# Patient Record
Sex: Female | Born: 1950 | Hispanic: Yes | Marital: Married | Smoking: Never smoker
Health system: Southern US, Community
[De-identification: ages and names within clinical notes are randomized; demographics above are authoritative.]

## PROBLEM LIST (undated history)

## (undated) DIAGNOSIS — J45909 Unspecified asthma, uncomplicated: Secondary | ICD-10-CM

## (undated) DIAGNOSIS — I10 Essential (primary) hypertension: Secondary | ICD-10-CM

## (undated) DIAGNOSIS — D649 Anemia, unspecified: Secondary | ICD-10-CM

## (undated) DIAGNOSIS — B019 Varicella without complication: Secondary | ICD-10-CM

## (undated) DIAGNOSIS — J42 Unspecified chronic bronchitis: Secondary | ICD-10-CM

## (undated) DIAGNOSIS — M1990 Unspecified osteoarthritis, unspecified site: Secondary | ICD-10-CM

## (undated) DIAGNOSIS — G510 Bell's palsy: Secondary | ICD-10-CM

## (undated) DIAGNOSIS — I499 Cardiac arrhythmia, unspecified: Secondary | ICD-10-CM

## (undated) DIAGNOSIS — K219 Gastro-esophageal reflux disease without esophagitis: Secondary | ICD-10-CM

## (undated) DIAGNOSIS — E119 Type 2 diabetes mellitus without complications: Secondary | ICD-10-CM

## (undated) DIAGNOSIS — J449 Chronic obstructive pulmonary disease, unspecified: Secondary | ICD-10-CM

## (undated) DIAGNOSIS — F329 Major depressive disorder, single episode, unspecified: Secondary | ICD-10-CM

## (undated) DIAGNOSIS — F32A Depression, unspecified: Secondary | ICD-10-CM

## (undated) DIAGNOSIS — Z9889 Other specified postprocedural states: Secondary | ICD-10-CM

## (undated) DIAGNOSIS — I517 Cardiomegaly: Secondary | ICD-10-CM

## (undated) DIAGNOSIS — G629 Polyneuropathy, unspecified: Secondary | ICD-10-CM

## (undated) DIAGNOSIS — K760 Fatty (change of) liver, not elsewhere classified: Secondary | ICD-10-CM

## (undated) DIAGNOSIS — T7840XA Allergy, unspecified, initial encounter: Secondary | ICD-10-CM

## (undated) DIAGNOSIS — M797 Fibromyalgia: Secondary | ICD-10-CM

## (undated) DIAGNOSIS — I456 Pre-excitation syndrome: Secondary | ICD-10-CM

## (undated) DIAGNOSIS — Z9289 Personal history of other medical treatment: Secondary | ICD-10-CM

## (undated) DIAGNOSIS — M5430 Sciatica, unspecified side: Secondary | ICD-10-CM

## (undated) DIAGNOSIS — E785 Hyperlipidemia, unspecified: Secondary | ICD-10-CM

## (undated) DIAGNOSIS — R112 Nausea with vomiting, unspecified: Secondary | ICD-10-CM

## (undated) HISTORY — DX: Allergy, unspecified, initial encounter: T78.40XA

## (undated) HISTORY — DX: Pre-excitation syndrome: I45.6

## (undated) HISTORY — DX: Fibromyalgia: M79.7

## (undated) HISTORY — DX: Personal history of other medical treatment: Z92.89

## (undated) HISTORY — DX: Type 2 diabetes mellitus without complications: E11.9

## (undated) HISTORY — PX: UPPER GI ENDOSCOPY: SHX6162

## (undated) HISTORY — PX: CHOLECYSTECTOMY: SHX55

## (undated) HISTORY — DX: Varicella without complication: B01.9

## (undated) HISTORY — PX: CERVICAL CONE BIOPSY: SUR198

## (undated) HISTORY — DX: Unspecified osteoarthritis, unspecified site: M19.90

## (undated) HISTORY — DX: Chronic obstructive pulmonary disease, unspecified: J44.9

## (undated) HISTORY — DX: Major depressive disorder, single episode, unspecified: F32.9

## (undated) HISTORY — DX: Hyperlipidemia, unspecified: E78.5

## (undated) HISTORY — DX: Bell's palsy: G51.0

## (undated) HISTORY — PX: JOINT REPLACEMENT: SHX530

## (undated) HISTORY — DX: Depression, unspecified: F32.A

## (undated) HISTORY — DX: Unspecified asthma, uncomplicated: J45.909

## (undated) HISTORY — PX: COLONOSCOPY: SHX174

## (undated) HISTORY — DX: Gastro-esophageal reflux disease without esophagitis: K21.9

## (undated) HISTORY — DX: Sciatica, unspecified side: M54.30

## (undated) HISTORY — DX: Essential (primary) hypertension: I10

---

## 2003-02-19 HISTORY — PX: REPLACEMENT TOTAL KNEE: SUR1224

## 2007-02-19 HISTORY — PX: CARDIAC ELECTROPHYSIOLOGY MAPPING AND ABLATION: SHX1292

## 2016-03-01 ENCOUNTER — Other Ambulatory Visit (INDEPENDENT_AMBULATORY_CARE_PROVIDER_SITE_OTHER): Payer: Medicare HMO

## 2016-03-01 ENCOUNTER — Ambulatory Visit (INDEPENDENT_AMBULATORY_CARE_PROVIDER_SITE_OTHER): Payer: Medicare HMO | Admitting: Family

## 2016-03-01 ENCOUNTER — Encounter: Payer: Self-pay | Admitting: Family

## 2016-03-01 VITALS — BP 138/72 | HR 71 | Temp 98.0°F | Resp 16 | Ht 60.0 in | Wt 174.0 lb

## 2016-03-01 DIAGNOSIS — E119 Type 2 diabetes mellitus without complications: Secondary | ICD-10-CM | POA: Diagnosis not present

## 2016-03-01 DIAGNOSIS — E782 Mixed hyperlipidemia: Secondary | ICD-10-CM | POA: Insufficient documentation

## 2016-03-01 DIAGNOSIS — F330 Major depressive disorder, recurrent, mild: Secondary | ICD-10-CM | POA: Diagnosis not present

## 2016-03-01 DIAGNOSIS — R079 Chest pain, unspecified: Secondary | ICD-10-CM

## 2016-03-01 DIAGNOSIS — I10 Essential (primary) hypertension: Secondary | ICD-10-CM | POA: Insufficient documentation

## 2016-03-01 LAB — COMPREHENSIVE METABOLIC PANEL
ALT: 17 U/L (ref 0–35)
AST: 21 U/L (ref 0–37)
Albumin: 4.3 g/dL (ref 3.5–5.2)
Alkaline Phosphatase: 75 U/L (ref 39–117)
BILIRUBIN TOTAL: 0.3 mg/dL (ref 0.2–1.2)
BUN: 17 mg/dL (ref 6–23)
CALCIUM: 9.7 mg/dL (ref 8.4–10.5)
CHLORIDE: 103 meq/L (ref 96–112)
CO2: 32 meq/L (ref 19–32)
Creatinine, Ser: 0.65 mg/dL (ref 0.40–1.20)
GFR: 96.67 mL/min (ref >60.00)
GLUCOSE: 121 mg/dL — AB (ref 70–99)
Potassium: 4 mEq/L (ref 3.5–5.1)
Sodium: 140 mEq/L (ref 135–145)
Total Protein: 7.3 g/dL (ref 6.0–8.3)

## 2016-03-01 LAB — MICROALBUMIN / CREATININE URINE RATIO
CREATININE, U: 171.8 mg/dL
Microalb Creat Ratio: 20.1 mg/g (ref 0.0–30.0)
Microalb, Ur: 34.6 mg/dL — ABNORMAL HIGH (ref 0.0–1.9)

## 2016-03-01 LAB — HEMOGLOBIN A1C: HEMOGLOBIN A1C: 7.3 % — AB (ref 4.6–6.5)

## 2016-03-01 MED ORDER — GLIPIZIDE ER 2.5 MG PO TB24: 2.5000 mg | ORAL_TABLET | Freq: Every day | ORAL | 0 refills | Status: DC

## 2016-03-01 NOTE — Assessment & Plan Note (Signed)
Sharp chest pain described by patient with an office EKG showing normal sinus rhythm. No evidence of Wolf Parkinson's White syndrome. Does not appear to be cardiac related. Continue to monitor.

## 2016-03-01 NOTE — Assessment & Plan Note (Signed)
Pressures below goal 140/90 with current regimen and no adverse side effects or hypotensive readings. Denies worst headache of life with no new symptoms of end organ damage noted upon physical exam. Encouraged to continue monitor blood pressure at home and follow low-sodium diet. Continue current dosage of lisinopril-hydrochlorothiazide.

## 2016-03-01 NOTE — Assessment & Plan Note (Signed)
Depression appears stable with current regimen and no adverse side effects of medication. Denies suicidal ideation. Continue current dosage of Lexapro.

## 2016-03-01 NOTE — Assessment & Plan Note (Signed)
Dyslipidemia appears stable with current regimen and no adverse side effects or myalgias. Recheck lipid profile during annual exam. Continue current dosage of lovastatin pending lipid profile results.

## 2016-03-01 NOTE — Patient Instructions (Addendum)
Thank you for choosing [facility].  SUMMARY AND INSTRUCTIONS:  Medication:  Please STOP taking the glipizide and start taking Glipizide ER.   Continue to take your medications as prescribed and monitor your blood sugars and blood pressure at home.   Your prescription(s) have been submitted to your pharmacy or been printed and provided for you. Please take as directed and contact our office if you believe you are having problem(s) with the medication(s) or have any questions.  Labs:  Please stop by the lab on the lower level of the building for your blood work. Your results will be released to [place] (or called to you) after review, usually within 72 hours after test completion. If any changes need to be made, you will be notified at that same time.  1.) The lab is open from 7:30am to 5:30 pm Monday-Friday 2.) No appointment is necessary 3.) Fasting (if needed) is 6-8 hours after food and drink; black coffee and water are okay   Follow up:  If your symptoms worsen or fail to improve, please contact our office for further instruction, or in case of emergency go directly to the emergency room at the closest medical facility.    DIABETES CARE INSTRUCTIONS:  Current A1c: No results found for: HGBA1C  A1C Goal: <7.0%  Fasting Blood Sugar Goal: 80-130 Blood sugar check that you take after fasting for at least 8 hours which is usually in the morning before eating.  Post-prandial Blood Sugar Goal: <180 Blood sugar check approximately 1-2 hours after the start of a meal.   Diabetes Prevention Screens:  1.) Ensure you have an annual eye exam by an ophthalmologist or optometrist.   2,) Ensure you have an annual foot exam or when any changes are noted including new onset numbness/tingling or wounds. Check your feet daily!  3.) The American Diabetes Association recommends the Pneumovax vaccination against pneumonia every 5 years.  4.) We will check your kidney function with a  urine test on an annual basis.

## 2016-03-01 NOTE — Progress Notes (Signed)
Subjective:    Patient ID: Deanna Watson, female    DOB: 1951-02-17, 66 y.o.   MRN: [id number]  Chief Complaint  Patient presents with  . Establish Care    diabetes,hypertension, depression    HPI:  Deanna Watson is a 66 y.o. female who  has a past medical history of Arthritis; Asthma; Chicken pox; COPD (chronic obstructive pulmonary disease) ([place]); Depression; Diabetes mellitus without complication ([place]); Fibromyalgia; Heart murmur; History of blood transfusion; Hyperlipidemia; and Hypertension. and presents today for an office visit to establish care.   1.)  Hypertension - Currently maintained on lisinopril-hydrochlorothiazide. Reports taking the medication as prescribed and denies adverse side effects. Blood pressures at home  . Denies worse headache of life. Does have some changes in vision. Working on following a low-sodium diet.  BP Readings from Last 3 Encounters:  03/01/16 138/72    2.) Type 2 diabetes - currently maintained on metformin and glipizide. Reports taking the medication as prescribed and denies adverse side effects.  Has had some hypoglycemia with glipizide on occasion and has made adjustment to her medications which seems to help. Home blood sugars appear to be adequately controlled around goal of 130. Does have diabetic foot pain. Due for eye exam and Pneumovax.    3.) Hyperlipidemia - Currently maintained on lovastatin. Reports taking the medication as prescribed and denies adverse side effects or myalgias. Does express some mild chest pain on occasion with no shortness of breath or heart palpitations.   No results found for: CHOL, HDL, LDLCALC, LDLDIRECT, TRIG, CHOLHDL   4.) Depression - currently maintained on Lexapro. Reports taking medication as prescribed and denies adverse side effects. Notes mood is generally well controlled with medication regimen with no adverse side effects or thoughts of suicide. Does continue to have a significant amount of stress  associated with her chronic medical conditions and as his medical conditions.   No Known Allergies    No outpatient prescriptions prior to visit.   No facility-administered medications prior to visit.      Past Medical History:  Diagnosis Date  . Arthritis   . Asthma   . Chicken pox   . COPD (chronic obstructive pulmonary disease) ([place])   . Depression   . Diabetes mellitus without complication ([place])   . Fibromyalgia   . Heart murmur   . History of blood transfusion   . Hyperlipidemia   . Hypertension       Past Surgical History:  Procedure Laterality Date  . CARDIAC ELECTROPHYSIOLOGY [place] AND ABLATION  2009  . REPLACEMENT TOTAL KNEE Left 2005      Family History  Problem Relation Age of Onset  . Arthritis Mother   . Heart disease Mother   . Stroke Mother   . Hypertension Mother   . Kidney disease Mother   . Diabetes Mother   . Alcohol abuse Father   . Epilepsy Father   . Heart disease Brother       Social History   Social History  . Marital status: Married    Spouse name: N/A  . Number of children: 1  . Years of education: 77   Occupational History  . Retired    Social History Main Topics  . Smoking status: Never Smoker  . Smokeless tobacco: Never Used  . Alcohol use No  . Drug use: No  . Sexual activity: Not on file   Other Topics Concern  . Not on file   Social History Narrative   Denies  abuse and feels safe at home.       Review of Systems  Constitutional: Negative for chills and fever.  Eyes:       Negative for changes in vision  Respiratory: Negative for cough, chest tightness, shortness of breath and wheezing.   Cardiovascular: Negative for chest pain, palpitations and leg swelling.  Endocrine: Negative for polydipsia, polyphagia and polyuria.  Neurological: Positive for numbness. Negative for dizziness, weakness and light-headedness.       Objective:    BP 138/72   Pulse 71   Temp 98 F (36.7 C) (Oral)   Resp 16    Ht 5' (1.524 m)   Wt 174 lb (78.9 kg)   SpO2 97%   BMI 33.98 kg/m  Nursing note and vital signs reviewed.  Physical Exam  Constitutional: She is oriented to person, place, and time. She appears well-developed and well-nourished. No distress.  Cardiovascular: Normal rate, regular rhythm, normal heart sounds and intact distal pulses.  Exam reveals no gallop and no friction rub.   No murmur heard. Pulmonary/Chest: Effort normal and breath sounds normal. No respiratory distress. She has no wheezes. She has no rales. She exhibits no tenderness.  Neurological: She is alert and oriented to person, place, and time.  Skin: Skin is warm and dry.  Psychiatric: She has a normal mood and affect. Her behavior is normal. Judgment and thought content normal.        Assessment & Plan:   Problem List Items Addressed This Visit      Cardiovascular and Mediastinum   Essential hypertension    Pressures below goal 140/90 with current regimen and no adverse side effects or hypotensive readings. Denies worst headache of life with no new symptoms of end organ damage noted upon physical exam. Encouraged to continue monitor blood pressure at home and follow low-sodium diet. Continue current dosage of lisinopril-hydrochlorothiazide.       Relevant Medications   lisinopril-hydrochlorothiazide (PRINZIDE,ZESTORETIC) 20-12.5 MG tablet   lovastatin (MEVACOR) 10 MG tablet     Endocrine   Type 2 diabetes mellitus without complication, without long-term current use of insulin (HCC) - Primary    Obtain A1c and urine microalbumin. Current regimen with occasional hypoglycemic readings secondary to glipizide. Discontinue glipizide. Start glipizide XL. Continue current dosage of metformin. Encouraged to monitor blood sugars at least 1 time per day at home. Maintained on lisinopril and lovastatin for CAD risk reduction. Due for Pneumovax. Due for diabetic eye exam.        Relevant Medications   metFORMIN (GLUCOPHAGE)  500 MG tablet   lisinopril-hydrochlorothiazide (PRINZIDE,ZESTORETIC) 20-12.5 MG tablet   lovastatin (MEVACOR) 10 MG tablet   glipiZIDE (GLUCOTROL XL) 2.5 MG 24 hr tablet   Other Relevant Orders   Hemoglobin A1c (Completed)   Comp Met (CMET) (Completed)   Urine Microalbumin w/creat. ratio (Completed)     Other   Chest pain    Sharp chest pain described by patient with an office EKG showing normal sinus rhythm. No evidence of Wolf Parkinson's White syndrome. Does not appear to be cardiac related. Continue to monitor.      Relevant Orders   EKG 12-Lead (Completed)   Mild episode of recurrent major depressive disorder (HCC)    Depression appears stable with current regimen and no adverse side effects of medication. Denies suicidal ideation. Continue current dosage of Lexapro.      Relevant Medications   escitalopram (LEXAPRO) 20 MG tablet   Mixed hyperlipidemia    Dyslipidemia appears  stable with current regimen and no adverse side effects or myalgias. Recheck lipid profile during annual exam. Continue current dosage of lovastatin pending lipid profile results.      Relevant Medications   lisinopril-hydrochlorothiazide (PRINZIDE,ZESTORETIC) 20-12.5 MG tablet   lovastatin (MEVACOR) 10 MG tablet       I have discontinued Ms. Eakes's glipiZIDE. I am also having her start on glipiZIDE. Additionally, I am having her maintain her metFORMIN, escitalopram, gabapentin, lisinopril-hydrochlorothiazide, multivitamin, lovastatin, budesonide-formoterol, albuterol, and montelukast.   Meds ordered this encounter  Medications  . metFORMIN (GLUCOPHAGE) 500 MG tablet    Sig: Take by mouth 2 (two) times daily with a meal.  . DISCONTD: glipiZIDE (GLUCOTROL) 5 MG tablet    Sig: Take by mouth 2 (two) times daily before a meal.  . escitalopram (LEXAPRO) 20 MG tablet    Sig: Take 20 mg by mouth daily.  Marland Kitchen gabapentin (NEURONTIN) 300 MG capsule    Sig: Take 300 mg by mouth 3 (three) times daily.  Marland Kitchen  lisinopril-hydrochlorothiazide (PRINZIDE,ZESTORETIC) 20-12.5 MG tablet    Sig: Take 1 tablet by mouth daily.  . Multiple Vitamin (MULTIVITAMIN) tablet    Sig: Take 1 tablet by mouth daily.  Marland Kitchen lovastatin (MEVACOR) 10 MG tablet    Sig: Take 10 mg by mouth at bedtime.  . budesonide-formoterol (SYMBICORT) 160-4.5 MCG/ACT inhaler    Sig: Inhale 1 puff into the lungs 2 (two) times daily.  Marland Kitchen albuterol (PROVENTIL HFA;VENTOLIN HFA) 108 (90 Base) MCG/ACT inhaler    Sig: Inhale into the lungs every 6 (six) hours as needed for wheezing or shortness of breath.  . montelukast (SINGULAIR) 10 MG tablet    Sig: Take 10 mg by mouth at bedtime.  Marland Kitchen glipiZIDE (GLUCOTROL XL) 2.5 MG 24 hr tablet    Sig: Take 1 tablet (2.5 mg total) by mouth daily with breakfast.    Dispense:  90 tablet    Refill:  0    Order Specific Question:   Supervising Provider    Answer:   Pricilla Holm A [[id number]]     Follow-up: Return in about 3 months (around 05/30/2016), or if symptoms worsen or fail to improve.  Mauricio Po, FNP

## 2016-03-01 NOTE — Assessment & Plan Note (Signed)
Obtain A1c and urine microalbumin. Current regimen with occasional hypoglycemic readings secondary to glipizide. Discontinue glipizide. Start glipizide XL. Continue current dosage of metformin. Encouraged to monitor blood sugars at least 1 time per day at home. Maintained on lisinopril and lovastatin for CAD risk reduction. Due for Pneumovax. Due for diabetic eye exam.

## 2016-03-01 NOTE — Progress Notes (Signed)
Pre visit review using our clinic review tool, if applicable. No additional management support is needed unless otherwise documented below in the visit note. 

## 2016-04-04 ENCOUNTER — Ambulatory Visit (INDEPENDENT_AMBULATORY_CARE_PROVIDER_SITE_OTHER): Payer: Medicare HMO | Admitting: Family

## 2016-04-04 ENCOUNTER — Other Ambulatory Visit (INDEPENDENT_AMBULATORY_CARE_PROVIDER_SITE_OTHER): Payer: Medicare HMO

## 2016-04-04 ENCOUNTER — Encounter: Payer: Self-pay | Admitting: Family

## 2016-04-04 VITALS — BP 138/72 | HR 75 | Temp 98.0°F | Resp 16 | Ht 60.0 in | Wt 175.0 lb

## 2016-04-04 DIAGNOSIS — Z7289 Other problems related to lifestyle: Secondary | ICD-10-CM

## 2016-04-04 DIAGNOSIS — Z0000 Encounter for general adult medical examination without abnormal findings: Secondary | ICD-10-CM | POA: Diagnosis not present

## 2016-04-04 DIAGNOSIS — Z23 Encounter for immunization: Secondary | ICD-10-CM | POA: Diagnosis not present

## 2016-04-04 DIAGNOSIS — E2839 Other primary ovarian failure: Secondary | ICD-10-CM

## 2016-04-04 DIAGNOSIS — Z9189 Other specified personal risk factors, not elsewhere classified: Secondary | ICD-10-CM

## 2016-04-04 DIAGNOSIS — Z1231 Encounter for screening mammogram for malignant neoplasm of breast: Secondary | ICD-10-CM | POA: Diagnosis not present

## 2016-04-04 LAB — LIPID PANEL
CHOLESTEROL: 190 mg/dL (ref 0–200)
HDL: 73.2 mg/dL (ref >39.00)
LDL Cholesterol: 99 mg/dL (ref 0–99)
NONHDL: 116.98
Total CHOL/HDL Ratio: 3
Triglycerides: 92 mg/dL (ref 0.0–149.0)
VLDL: 18.4 mg/dL (ref 0.0–40.0)

## 2016-04-04 LAB — HEPATITIS C ANTIBODY: HCV Ab: NEGATIVE

## 2016-04-04 MED ORDER — ALBUTEROL SULFATE HFA 108 (90 BASE) MCG/ACT IN AERS: 1.0000 | INHALATION_SPRAY | Freq: Four times a day (QID) | RESPIRATORY_TRACT | 0 refills | Status: DC | PRN

## 2016-04-04 NOTE — Progress Notes (Signed)
Subjective:    Patient ID: Deanna Watson, female    DOB: 03-09-1950, 66 y.o.   MRN: [id number]  Chief Complaint  Patient presents with  . CPE    not fasting    HPI:  Deanna Watson is a 66 y.o. female who presents today for a Medicare Annual Wellness/Physical exam.    1) Health Maintenance -   Diet - Averaging about 3 meals per day consisting of a heart healty; Caffeine intake of about 1-2 cups per day  Exercise - Walking on occasion; no structured exercise   2) Preventative Exams / Immunizations:  Dental -- Due for exam  Vision -- Up to date; scheduled for eye exam   Health Maintenance  Topic Date Due  . Hepatitis C Screening  [birth year]  . FOOT EXAM  04/19/1959  . OPHTHALMOLOGY EXAM  04/19/1959  . MAMMOGRAM  04/19/1999  . COLONOSCOPY  04/19/1999  . ZOSTAVAX  04/18/2009  . DEXA SCAN  04/19/2014  . PNA vac Low Risk Adult (1 of 2 - PCV13) 04/19/2014  . TETANUS/TDAP  05/02/2016 (Originally 04/18/1968)  . HEMOGLOBIN A1C  08/29/2016  . INFLUENZA VACCINE  Completed     Immunization History  Administered Date(s) Administered  . Influenza-Unspecified 12/20/2015  . Pneumococcal Conjugate-13 04/04/2016    RISK FACTORS  Tobacco History  Smoking Status  . Never Smoker  Smokeless Tobacco  . Never Used     Cardiac risk factors: advanced age (older than 36 for men, 63 for women), diabetes mellitus, dyslipidemia, hypertension and obesity (BMI >= 30 kg/m2).  Depression Screen  Depression screen [facility] 2/9 03/01/2016  Decreased Interest 0  Down, Depressed, Hopeless 1  PHQ - 2 Score 1     Activities of Daily Living In your present state of health, do you have any difficulty performing the following activities?:  Driving? No Managing money?  No Feeding yourself? No Getting from bed to chair? No Climbing a flight of stairs? No Preparing food and eating?: No Bathing or showering? No Getting dressed: No Getting to the toilet? No Using the toilet: No Moving  around from place to place: No In the past year have you fallen or had a near fall?:No   Home Safety Has smoke detector and wears seat belts. No firearms. No excess sun exposure. Are there smokers in your home (other than you)?  No Do you feel safe at home?  Yes  Hearing Difficulties: No Do you often ask people to speak up or repeat themselves? No Do you experience ringing or noises in your ears? No  Do you have difficulty understanding soft or whispered voices? No    Cognitive Testing  Alert? Yes   Normal Appearance? Yes  Oriented to person? Yes  Place? Yes   Time? Yes  Recall of three objects?  Yes  Can perform simple calculations? Yes  Displays appropriate judgment? Yes  Can read the correct time from a watch face? Yes  Do you feel that you have a problem with memory? No  Do you often misplace items? No   Advanced Directives have been discussed with the patient? Yes   Current Physicians/Providers and Suppliers  1. Terri Piedra, FNP - Internal Medicine  Indicate any recent Medical Services you may have received from other than [facility] providers in the past year (date may be approximate).  All answers were reviewed with the patient and necessary referrals were made:  Mauricio Po, [place]   04/04/2016    No Known Allergies   Outpatient  Medications Prior to Visit  Medication Sig Dispense Refill  . budesonide-formoterol (SYMBICORT) 160-4.5 MCG/ACT inhaler Inhale 1 puff into the lungs 2 (two) times daily.    Marland Kitchen escitalopram (LEXAPRO) 20 MG tablet Take 20 mg by mouth daily.    Marland Kitchen gabapentin (NEURONTIN) 300 MG capsule Take 300 mg by mouth 3 (three) times daily.    Marland Kitchen glipiZIDE (GLUCOTROL XL) 2.5 MG 24 hr tablet Take 1 tablet (2.5 mg total) by mouth daily with breakfast. 90 tablet 0  . lisinopril-hydrochlorothiazide (PRINZIDE,ZESTORETIC) 20-12.5 MG tablet Take 1 tablet by mouth daily.    Marland Kitchen lovastatin (MEVACOR) 10 MG tablet Take 10 mg by mouth at bedtime.    . metFORMIN (GLUCOPHAGE)  500 MG tablet Take by mouth 2 (two) times daily with a meal.    . Multiple Vitamin (MULTIVITAMIN) tablet Take 1 tablet by mouth daily.    Marland Kitchen albuterol (PROVENTIL HFA;VENTOLIN HFA) 108 (90 Base) MCG/ACT inhaler Inhale into the lungs every 6 (six) hours as needed for wheezing or shortness of breath.    . montelukast (SINGULAIR) 10 MG tablet Take 10 mg by mouth at bedtime.     No facility-administered medications prior to visit.      Past Medical History:  Diagnosis Date  . Arthritis   . Asthma   . Chicken pox   . COPD (chronic obstructive pulmonary disease) ([place])   . Depression   . Diabetes mellitus without complication ([place])   . Fibromyalgia   . Heart murmur   . History of blood transfusion   . Hyperlipidemia   . Hypertension      Past Surgical History:  Procedure Laterality Date  . CARDIAC ELECTROPHYSIOLOGY [place] AND ABLATION  2009  . REPLACEMENT TOTAL KNEE Left 2005     Family History  Problem Relation Age of Onset  . Arthritis Mother   . Heart disease Mother   . Stroke Mother   . Hypertension Mother   . Kidney disease Mother   . Diabetes Mother   . Alcohol abuse Father   . Epilepsy Father   . Heart disease Brother      Social History   Social History  . Marital status: Married    Spouse name: N/A  . Number of children: 1  . Years of education: 89   Occupational History  . Retired    Social History Main Topics  . Smoking status: Never Smoker  . Smokeless tobacco: Never Used  . Alcohol use No  . Drug use: No  . Sexual activity: Not on file   Other Topics Concern  . Not on file   Social History Narrative   Denies abuse and feels safe at home.      Review of Systems  Constitutional: Denies fever, chills, fatigue, or significant weight gain/loss. HENT: Head: Denies headache or neck pain Ears: Denies changes in hearing, ringing in ears, earache, drainage Nose: Denies discharge, stuffiness, itching, nosebleed, sinus pain Throat: Denies sore  throat, hoarseness, dry mouth, sores, thrush Eyes: Denies loss/changes in vision, pain, redness, blurry/double vision, flashing lights Cardiovascular: Denies chest pain/discomfort, tightness, palpitations, shortness of breath with activity, difficulty lying down, swelling, sudden awakening with shortness of breath Respiratory: Denies shortness of breath, cough, sputum production, wheezing Gastrointestinal: Denies dysphasia, heartburn, change in appetite, nausea, change in bowel habits, rectal bleeding, constipation, diarrhea, yellow skin or eyes Genitourinary: Denies frequency, urgency, burning/pain, blood in urine, incontinence, change in urinary strength. Musculoskeletal: Denies muscle/joint pain, stiffness, back pain, redness or swelling of joints, trauma  Skin: Denies rashes, lumps, itching, dryness, color changes, or hair/nail changes Neurological: Denies dizziness, fainting, seizures, weakness, numbness, tingling, tremor Psychiatric - Denies nervousness, stress, depression or memory loss Endocrine: Denies heat or cold intolerance, sweating, frequent urination, excessive thirst, changes in appetite Hematologic: Denies ease of bruising or bleeding    Objective:     BP 138/72 (BP Location: Left Arm, Patient Position: Sitting, Cuff Size: Large)   Pulse 75   Temp 98 F (36.7 C) (Oral)   Resp 16   Ht 5' (1.524 m)   Wt 175 lb (79.4 kg)   SpO2 97%   BMI 34.18 kg/m  Nursing note and vital signs reviewed.  Physical Exam  Constitutional: She is oriented to person, place, and time. She appears well-developed and well-nourished.  HENT:  Head: Normocephalic.  Right Ear: Hearing, tympanic membrane, external ear and ear canal normal.  Left Ear: Hearing, tympanic membrane, external ear and ear canal normal.  Nose: Nose normal.  Mouth/Throat: Uvula is midline, oropharynx is clear and moist and mucous membranes are normal.  Eyes: Conjunctivae and EOM are normal. Pupils are equal, round, and  reactive to light.  Neck: Neck supple. No JVD present. No tracheal deviation present. No thyromegaly present.  Cardiovascular: Normal rate, regular rhythm, normal heart sounds and intact distal pulses.   Pulmonary/Chest: Effort normal and breath sounds normal.  Abdominal: Soft. Bowel sounds are normal. She exhibits no distension and no mass. There is no tenderness. There is no rebound and no guarding.  Musculoskeletal: Normal range of motion. She exhibits no edema or tenderness.  Lymphadenopathy:    She has no cervical adenopathy.  Neurological: She is alert and oriented to person, place, and time. She has normal reflexes. No cranial nerve deficit. She exhibits normal muscle tone. Coordination normal.  Skin: Skin is warm and dry.  Psychiatric: She has a normal mood and affect. Her behavior is normal. Judgment and thought content normal.       Assessment & Plan:   During the course of the visit the patient was educated and counseled about appropriate screening and preventive services including:    Pneumococcal vaccine   Influenza vaccine  Td vaccine  Colorectal cancer screening  Diabetes screening  Glaucoma screening  Nutrition counseling   Diet review for nutrition referral? Yes ____  Not Indicated _X___   Patient Instructions (the written plan) was given to the patient.  Medicare Attestation I have personally reviewed: The patient's medical and social history Their use of alcohol, tobacco or illicit drugs Their current medications and supplements The patient's functional ability including ADLs,fall risks, home safety risks, cognitive, and hearing and visual impairment Diet and physical activities Evidence for depression or mood disorders  The patient's weight, height, BMI,  have been recorded in the chart.  I have made referrals, counseling, and provided education to the patient based on review of the above and I have provided the patient with a written personalized  care plan for preventive services.     Problem List Items Addressed This Visit      Other   Medicare annual wellness visit, subsequent - Primary    Reviewed and updated patient's medical, surgical, family and social history. Medications and allergies were also reviewed. Basic screenings for depression, activities of daily living, hearing, cognition and safety were performed. Provider list was updated and health plan was provided to the patient.        Routine adult health maintenance    1) Anticipatory Guidance: Discussed importance  of wearing a seatbelt while driving and not texting while driving; changing batteries in smoke detector at least once annually; wearing suntan lotion when outside; eating a balanced and moderate diet; getting physical activity at least 30 minutes per day.  2) Immunizations / Screenings / Labs:  Prevnar updated today. Declines shingles and tetanus. All other immunizations are up-to-date per recommendations. Obtain hepatitis C antibody for hepatitis C screening. Due for breast cancer screening with order for mammogram placed. Due for colon cancer screening with referral to gastroenterology placed for colonoscopy. Due for diabetic eye exam with exam scheduled. Due for dental exam encouraged to be completed independently. Diabetic foot exam completed today. Obtain DEXA for bone mineral density evaluation. Obtain vitamin D for vitamin D deficiency screening. All other screenings are up-to-date per recommendations. Obtain lipid profile as previous blood work evaluated.  Overall well exam with risk factors for cardiovascular disease including type 2 diabetes, hypertension, hyperlipidemia, obesity. Recommend weight loss of 5-10% of current body weight through nutrition and physical activity.  Chronic conditions periodically managed through current medication regimen with no adverse side effects. Continue other healthy lifestyle behaviors and choices. Follow-up prevention exam  in 1 year. Follow-up office visit pending blood work and for chronic conditions.       Relevant Orders   Lipid panel   VITAMIN D 25 Hydroxy (Vit-D Deficiency, Fractures)    Other Visit Diagnoses    Encounter for screening mammogram for breast cancer       Relevant Orders   MM DIGITAL SCREENING BILATERAL   At risk for colon cancer       Relevant Orders   Ambulatory referral to Gastroenterology   Estrogen deficiency       Relevant Orders   DG DXA FRACTURE ASSESSMENT   Other problems related to lifestyle       Relevant Orders   Hepatitis C antibody (Completed)   Need for vaccination with 13-polyvalent pneumococcal conjugate vaccine       Relevant Orders   Pneumococcal conjugate vaccine 13-valent IM (Completed)       I have discontinued Ms. Iversen's albuterol and montelukast. I am also having her start on albuterol. Additionally, I am having her maintain her metFORMIN, escitalopram, gabapentin, lisinopril-hydrochlorothiazide, multivitamin, lovastatin, budesonide-formoterol, and glipiZIDE.   Meds ordered this encounter  Medications  . albuterol (PROAIR HFA) 108 (90 Base) MCG/ACT inhaler    Sig: Inhale 1-2 puffs into the lungs every 6 (six) hours as needed for wheezing or shortness of breath.    Dispense:  1 Inhaler    Refill:  0    Order Specific Question:   Supervising Provider    Answer:   Pricilla Holm A [id number]     Follow-up: Return in about 3 months (around 07/02/2016), or if symptoms worsen or fail to improve.   Mauricio Po, FNP

## 2016-04-04 NOTE — Assessment & Plan Note (Signed)
1) Anticipatory Guidance: Discussed importance of wearing a seatbelt while driving and not texting while driving; changing batteries in smoke detector at least once annually; wearing suntan lotion when outside; eating a balanced and moderate diet; getting physical activity at least 30 minutes per day.  2) Immunizations / Screenings / Labs:  Prevnar updated today. Declines shingles and tetanus. All other immunizations are up-to-date per recommendations. Obtain hepatitis C antibody for hepatitis C screening. Due for breast cancer screening with order for mammogram placed. Due for colon cancer screening with referral to gastroenterology placed for colonoscopy. Due for diabetic eye exam with exam scheduled. Due for dental exam encouraged to be completed independently. Diabetic foot exam completed today. Obtain DEXA for bone mineral density evaluation. Obtain vitamin D for vitamin D deficiency screening. All other screenings are up-to-date per recommendations. Obtain lipid profile as previous blood work evaluated.  Overall well exam with risk factors for cardiovascular disease including type 2 diabetes, hypertension, hyperlipidemia, obesity. Recommend weight loss of 5-10% of current body weight through nutrition and physical activity.  Chronic conditions periodically managed through current medication regimen with no adverse side effects. Continue other healthy lifestyle behaviors and choices. Follow-up prevention exam in 1 year. Follow-up office visit pending blood work and for chronic conditions.

## 2016-04-04 NOTE — Assessment & Plan Note (Signed)
Reviewed and updated patient's medical, surgical, family and social history. Medications and allergies were also reviewed. Basic screenings for depression, activities of daily living, hearing, cognition and safety were performed. Provider list was updated and health plan was provided to the patient.  

## 2016-04-04 NOTE — Patient Instructions (Addendum)
Thank you for choosing [facility].  SUMMARY AND INSTRUCTIONS:  They will call to schedule your colonoscopy and mammogram.   Medication:  Please continue to take your medication as prescribed.  Your prescription(s) have been submitted to your pharmacy or been printed and provided for you. Please take as directed and contact our office if you believe you are having problem(s) with the medication(s) or have any questions.  Labs:  Please stop by the lab on the lower level of the building for your blood work. Your results will be released to [place] (or called to you) after review, usually within 72 hours after test completion. If any changes need to be made, you will be notified at that same time.  1.) The lab is open from 7:30am to 5:30 pm Monday-Friday 2.) No appointment is necessary 3.) Fasting (if needed) is 6-8 hours after food and drink; black coffee and water are okay   Imaging / Radiology:  Please stop by radiology on the basement level of the building for your x-rays. Your results will be released to [place] (or called to you) after review, usually within 72 hours after test completion. If any treatments or changes are necessary, you will be notified at that same time.  Referrals:  Referrals have been made during this visit. You should expect to hear back from our schedulers in about 7-10 days in regards to establishing an appointment with the specialists we discussed.   Follow up:  If your symptoms worsen or fail to improve, please contact our office for further instruction, or in case of emergency go directly to the emergency room at the closest medical facility.   Health Maintenance  Topic Date Due  . Hepatitis C Screening  [birth year]  . FOOT EXAM  04/18/65  . OPHTHALMOLOGY EXAM  04/18/65  . MAMMOGRAM  04/18/65  . COLONOSCOPY  04/18/65  . ZOSTAVAX  04/18/65  . DEXA SCAN  04/18/65  . PNA vac Low Risk Adult (1 of 2 - PCV13) 04/18/65  .  TETANUS/TDAP  05/02/2016 (Originally 04/18/65)  . HEMOGLOBIN A1C  08/29/2016  . INFLUENZA VACCINE  Completed    Health Maintenance, Female Introduction Adopting a healthy lifestyle and getting preventive care can go a long way to promote health and wellness. Talk with your health care provider about what schedule of regular examinations is right for you. This is a good chance for you to check in with your provider about disease prevention and staying healthy. In between checkups, there are plenty of things you can do on your own. Experts have done a lot of research about which lifestyle changes and preventive measures are most likely to keep you healthy. Ask your health care provider for more information. Weight and diet Eat a healthy diet  Be sure to include plenty of vegetables, fruits, low-fat dairy products, and lean protein.  Do not eat a lot of foods high in solid fats, added sugars, or salt.  Get regular exercise. This is one of the most important things you can do for your health.  Most adults should exercise for at least 150 minutes each week. The exercise should increase your heart rate and make you sweat (moderate-intensity exercise).  Most adults should also do strengthening exercises at least twice a week. This is in addition to the moderate-intensity exercise. Maintain a healthy weight  Body mass index (BMI) is a measurement that can be used to identify possible weight problems. It estimates body fat based on height and weight. Your  health care provider can help determine your BMI and help you achieve or maintain a healthy weight.  For females 3 years of age and older:  A BMI below 18.5 is considered underweight.  A BMI of 18.5 to 24.9 is normal.  A BMI of 25 to 29.9 is considered overweight.  A BMI of 30 and above is considered obese. Watch levels of cholesterol and blood lipids  You should start having your blood tested for lipids and cholesterol at 67 years of  age, then have this test every 5 years.  You may need to have your cholesterol levels checked more often if:  Your lipid or cholesterol levels are high.  You are older than 67 years of age.  You are at high risk for heart disease. Cancer screening Lung Cancer  Lung cancer screening is recommended for adults 65-66 years old who are at high risk for lung cancer because of a history of smoking.  A yearly low-dose CT scan of the lungs is recommended for people who:  Currently smoke.  Have quit within the past 15 years.  Have at least a 30-pack-year history of smoking. A pack year is smoking an average of one pack of cigarettes a day for 1 year.  Yearly screening should continue until it has been 15 years since you quit.  Yearly screening should stop if you develop a health problem that would prevent you from having lung cancer treatment. Breast Cancer  Practice breast self-awareness. This means understanding how your breasts normally appear and feel.  It also means doing regular breast self-exams. Let your health care provider know about any changes, no matter how small.  If you are in your 20s or 30s, you should have a clinical breast exam (CBE) by a health care provider every 1-3 years as part of a regular health exam.  If you are 37 or older, have a CBE every year. Also consider having a breast X-ray (mammogram) every year.  If you have a family history of breast cancer, talk to your health care provider about genetic screening.  If you are at high risk for breast cancer, talk to your health care provider about having an MRI and a mammogram every year.  Breast cancer gene (BRCA) assessment is recommended for women who have family members with BRCA-related cancers. BRCA-related cancers include:  Breast.  Ovarian.  Tubal.  Peritoneal cancers.  Results of the assessment will determine the need for genetic counseling and BRCA1 and BRCA2 testing. Cervical Cancer  Your  health care provider may recommend that you be screened regularly for cancer of the pelvic organs (ovaries, uterus, and vagina). This screening involves a pelvic examination, including checking for microscopic changes to the surface of your cervix (Pap test). You may be encouraged to have this screening done every 3 years, beginning at age 31.  For women ages 62-65, health care providers may recommend pelvic exams and Pap testing every 3 years, or they may recommend the Pap and pelvic exam, combined with testing for human papilloma virus (HPV), every 5 years. Some types of HPV increase your risk of cervical cancer. Testing for HPV may also be done on women of any age with unclear Pap test results.  Other health care providers may not recommend any screening for nonpregnant women who are considered low risk for pelvic cancer and who do not have symptoms. Ask your health care provider if a screening pelvic exam is right for you.  If you have had past  treatment for cervical cancer or a condition that could lead to cancer, you need Pap tests and screening for cancer for at least 20 years after your treatment. If Pap tests have been discontinued, your risk factors (such as having a new sexual partner) need to be reassessed to determine if screening should resume. Some women have medical problems that increase the chance of getting cervical cancer. In these cases, your health care provider may recommend more frequent screening and Pap tests. Colorectal Cancer  This type of cancer can be detected and often prevented.  Routine colorectal cancer screening usually begins at 67 years of age and continues through 67 years of age.  Your health care provider may recommend screening at an earlier age if you have risk factors for colon cancer.  Your health care provider may also recommend using home test kits to check for hidden blood in the stool.  A small camera at the end of a tube can be used to examine your  colon directly (sigmoidoscopy or colonoscopy). This is done to check for the earliest forms of colorectal cancer.  Routine screening usually begins at age 56.  Direct examination of the colon should be repeated every 5-10 years through 67 years of age. However, you may need to be screened more often if early forms of precancerous polyps or small growths are found. Skin Cancer  Check your skin from head to toe regularly.  Tell your health care provider about any new moles or changes in moles, especially if there is a change in a mole's shape or color.  Also tell your health care provider if you have a mole that is larger than the size of a pencil eraser.  Always use sunscreen. Apply sunscreen liberally and repeatedly throughout the day.  Protect yourself by wearing long sleeves, pants, a wide-brimmed hat, and sunglasses whenever you are outside. Heart disease, diabetes, and high blood pressure  High blood pressure causes heart disease and increases the risk of stroke. High blood pressure is more likely to develop in:  People who have blood pressure in the high end of the normal range (130-139/85-89 mm Hg).  People who are overweight or obese.  People who are African American.  If you are 72-31 years of age, have your blood pressure checked every 3-5 years. If you are 44 years of age or older, have your blood pressure checked every year. You should have your blood pressure measured twice-once when you are at a hospital or clinic, and once when you are not at a hospital or clinic. Record the average of the two measurements. To check your blood pressure when you are not at a hospital or clinic, you can use:  An automated blood pressure machine at a pharmacy.  A home blood pressure monitor.  If you are between 91 years and 7 years old, ask your health care provider if you should take aspirin to prevent strokes.  Have regular diabetes screenings. This involves taking a blood sample to  check your fasting blood sugar level.  If you are at a normal weight and have a low risk for diabetes, have this test once every three years after 67 years of age.  If you are overweight and have a high risk for diabetes, consider being tested at a younger age or more often. Preventing infection Hepatitis B  If you have a higher risk for hepatitis B, you should be screened for this virus. You are considered at high risk for hepatitis B  if:  You were born in a country where hepatitis B is common. Ask your health care provider which countries are considered high risk.  Your parents were born in a high-risk country, and you have not been immunized against hepatitis B (hepatitis B vaccine).  You have HIV or AIDS.  You use needles to inject street drugs.  You live with someone who has hepatitis B.  You have had sex with someone who has hepatitis B.  You get hemodialysis treatment.  You take certain medicines for conditions, including cancer, organ transplantation, and autoimmune conditions. Hepatitis C  Blood testing is recommended for:  Everyone born from [birth year] through 1966.  Anyone with known risk factors for hepatitis C. Sexually transmitted infections (STIs)  You should be screened for sexually transmitted infections (STIs) including gonorrhea and chlamydia if:  You are sexually active and are younger than 67 years of age.  You are older than 67 years of age and your health care provider tells you that you are at risk for this type of infection.  Your sexual activity has changed since you were last screened and you are at an increased risk for chlamydia or gonorrhea. Ask your health care provider if you are at risk.  If you do not have HIV, but are at risk, it may be recommended that you take a prescription medicine daily to prevent HIV infection. This is called pre-exposure prophylaxis (PrEP). You are considered at risk if:  You are sexually active and do not regularly use  condoms or know the HIV status of your partner(s).  You take drugs by injection.  You are sexually active with a partner who has HIV. Talk with your health care provider about whether you are at high risk of being infected with HIV. If you choose to begin PrEP, you should first be tested for HIV. You should then be tested every 3 months for as long as you are taking PrEP. Pregnancy  If you are premenopausal and you may become pregnant, ask your health care provider about preconception counseling.  If you may become pregnant, take 400 to 800 micrograms (mcg) of folic acid every day.  If you want to prevent pregnancy, talk to your health care provider about birth control (contraception). Osteoporosis and menopause  Osteoporosis is a disease in which the bones lose minerals and strength with aging. This can result in serious bone fractures. Your risk for osteoporosis can be identified using a bone density scan.  If you are 69 years of age or older, or if you are at risk for osteoporosis and fractures, ask your health care provider if you should be screened.  Ask your health care provider whether you should take a calcium or vitamin D supplement to lower your risk for osteoporosis.  Menopause may have certain physical symptoms and risks.  Hormone replacement therapy may reduce some of these symptoms and risks. Talk to your health care provider about whether hormone replacement therapy is right for you. Follow these instructions at home:  Schedule regular health, dental, and eye exams.  Stay current with your immunizations.  Do not use any tobacco products including cigarettes, chewing tobacco, or electronic cigarettes.  If you are pregnant, do not drink alcohol.  If you are breastfeeding, limit how much and how often you drink alcohol.  Limit alcohol intake to no more than 1 drink per day for nonpregnant women. One drink equals 12 ounces of beer, 5 ounces of wine, or 1 ounces of  hard  liquor.  Do not use street drugs.  Do not share needles.  Ask your health care provider for help if you need support or information about quitting drugs.  Tell your health care provider if you often feel depressed.  Tell your health care provider if you have ever been abused or do not feel safe at home. This information is not intended to replace advice given to you by your health care provider. Make sure you discuss any questions you have with your health care provider. Document Released: 08/20/2010 Document Revised: 07/13/2015 Document Reviewed: 11/08/2014  2017 Elsevier

## 2016-04-05 LAB — VITAMIN D 25 HYDROXY (VIT D DEFICIENCY, FRACTURES): VITD: 26.39 ng/mL — ABNORMAL LOW (ref 30.00–100.00)

## 2016-04-07 DIAGNOSIS — I10 Essential (primary) hypertension: Secondary | ICD-10-CM | POA: Diagnosis not present

## 2016-04-07 DIAGNOSIS — M1990 Unspecified osteoarthritis, unspecified site: Secondary | ICD-10-CM | POA: Diagnosis not present

## 2016-04-07 DIAGNOSIS — E785 Hyperlipidemia, unspecified: Secondary | ICD-10-CM | POA: Diagnosis not present

## 2016-04-07 DIAGNOSIS — E1142 Type 2 diabetes mellitus with diabetic polyneuropathy: Secondary | ICD-10-CM | POA: Diagnosis not present

## 2016-04-07 DIAGNOSIS — H547 Unspecified visual loss: Secondary | ICD-10-CM | POA: Diagnosis not present

## 2016-04-07 DIAGNOSIS — M545 Low back pain: Secondary | ICD-10-CM | POA: Diagnosis not present

## 2016-04-07 DIAGNOSIS — Z6833 Body mass index (BMI) 33.0-33.9, adult: Secondary | ICD-10-CM | POA: Diagnosis not present

## 2016-04-07 DIAGNOSIS — J449 Chronic obstructive pulmonary disease, unspecified: Secondary | ICD-10-CM | POA: Diagnosis not present

## 2016-04-07 DIAGNOSIS — F330 Major depressive disorder, recurrent, mild: Secondary | ICD-10-CM | POA: Diagnosis not present

## 2016-04-08 ENCOUNTER — Encounter: Payer: Self-pay | Admitting: Gastroenterology

## 2016-04-09 ENCOUNTER — Other Ambulatory Visit: Payer: Self-pay | Admitting: Family

## 2016-04-09 ENCOUNTER — Ambulatory Visit (INDEPENDENT_AMBULATORY_CARE_PROVIDER_SITE_OTHER)
Admission: RE | Admit: 2016-04-09 | Discharge: 2016-04-09 | Disposition: A | Discharge status: Home / Self Care | Payer: Medicare HMO | Source: Ambulatory Visit | Attending: Family | Admitting: Family

## 2016-04-09 DIAGNOSIS — E2839 Other primary ovarian failure: Secondary | ICD-10-CM

## 2016-04-17 ENCOUNTER — Encounter: Payer: Self-pay | Admitting: Family

## 2016-05-03 ENCOUNTER — Ambulatory Visit
Admission: RE | Admit: 2016-05-03 | Discharge: 2016-05-03 | Disposition: A | Discharge status: Home / Self Care | Payer: Medicare HMO | Source: Ambulatory Visit | Attending: Family | Admitting: Family

## 2016-05-03 ENCOUNTER — Inpatient Hospital Stay: Admission: RE | Admit: 2016-05-03 | Payer: Medicare HMO | Source: Ambulatory Visit

## 2016-05-03 DIAGNOSIS — Z1231 Encounter for screening mammogram for malignant neoplasm of breast: Secondary | ICD-10-CM | POA: Diagnosis not present

## 2016-05-07 ENCOUNTER — Other Ambulatory Visit: Payer: Self-pay | Admitting: Family

## 2016-05-10 ENCOUNTER — Ambulatory Visit (AMBULATORY_SURGERY_CENTER): Payer: Self-pay

## 2016-05-10 VITALS — Ht 60.0 in | Wt 170.0 lb

## 2016-05-10 DIAGNOSIS — Z1211 Encounter for screening for malignant neoplasm of colon: Secondary | ICD-10-CM

## 2016-05-10 MED ORDER — SUPREP BOWEL PREP KIT 17.5-3.13-1.6 GM/177ML PO SOLN: 1.0000 | Freq: Once | ORAL | 0 refills | Status: AC

## 2016-05-10 NOTE — Progress Notes (Signed)
No allergies to eggs or soy No diet meds No home oxygen No past problems with anesthesia  Registered for emmi 

## 2016-05-20 ENCOUNTER — Telehealth: Payer: Self-pay | Admitting: Gastroenterology

## 2016-05-20 ENCOUNTER — Encounter: Payer: Self-pay | Admitting: Gastroenterology

## 2016-05-21 NOTE — Telephone Encounter (Signed)
Pt states her prep is 88$ with [employer] and she cannot pay that. Offered a prep- pt to pick it up 4th floor desk. She states she will get the prep today.  Medication Samples have been provided to the patient.  Drug name: suprep          Qty: 1  LOT: [id number]  Exp.Date: 3-20  Dosing instructions: use as directed at [facility]   The patient has been instructed regarding the correct time, dose, and frequency of taking this medication, including desired effects and most common side effects.   Deanna Watson 11:16 AM 05/21/2016   Lelan Pons PV

## 2016-05-31 ENCOUNTER — Encounter: Payer: Self-pay | Admitting: Gastroenterology

## 2016-05-31 ENCOUNTER — Ambulatory Visit (AMBULATORY_SURGERY_CENTER): Payer: Medicare HMO | Admitting: Gastroenterology

## 2016-05-31 VITALS — BP 136/56 | HR 62 | Temp 97.3°F | Resp 16 | Ht 60.0 in | Wt 170.0 lb

## 2016-05-31 DIAGNOSIS — D128 Benign neoplasm of rectum: Secondary | ICD-10-CM

## 2016-05-31 DIAGNOSIS — Z1212 Encounter for screening for malignant neoplasm of rectum: Secondary | ICD-10-CM | POA: Diagnosis not present

## 2016-05-31 DIAGNOSIS — Z1211 Encounter for screening for malignant neoplasm of colon: Secondary | ICD-10-CM | POA: Diagnosis not present

## 2016-05-31 DIAGNOSIS — I10 Essential (primary) hypertension: Secondary | ICD-10-CM | POA: Diagnosis not present

## 2016-05-31 DIAGNOSIS — D129 Benign neoplasm of anus and anal canal: Secondary | ICD-10-CM

## 2016-05-31 DIAGNOSIS — D126 Benign neoplasm of colon, unspecified: Secondary | ICD-10-CM | POA: Diagnosis not present

## 2016-05-31 DIAGNOSIS — K635 Polyp of colon: Secondary | ICD-10-CM | POA: Diagnosis not present

## 2016-05-31 DIAGNOSIS — D125 Benign neoplasm of sigmoid colon: Secondary | ICD-10-CM

## 2016-05-31 DIAGNOSIS — K621 Rectal polyp: Secondary | ICD-10-CM | POA: Diagnosis not present

## 2016-05-31 DIAGNOSIS — E119 Type 2 diabetes mellitus without complications: Secondary | ICD-10-CM | POA: Diagnosis not present

## 2016-05-31 DIAGNOSIS — E669 Obesity, unspecified: Secondary | ICD-10-CM | POA: Diagnosis not present

## 2016-05-31 DIAGNOSIS — D127 Benign neoplasm of rectosigmoid junction: Secondary | ICD-10-CM | POA: Diagnosis not present

## 2016-05-31 MED ORDER — SODIUM CHLORIDE 0.9 % IV SOLN: 500.0000 mL | INTRAVENOUS | Status: DC

## 2016-05-31 NOTE — Progress Notes (Signed)
Called to room to assist during endoscopic procedure.  Patient ID and intended procedure confirmed with present staff. Received instructions for my participation in the procedure from the performing physician.  

## 2016-05-31 NOTE — Progress Notes (Signed)
To recovery, report to [place], RN, VSS 

## 2016-05-31 NOTE — Patient Instructions (Signed)
YOU HAD AN ENDOSCOPIC PROCEDURE TODAY AT [facility]:   Refer to the procedure report that was given to you for any specific questions about what was found during the examination.  If the procedure report does not answer your questions, please call your gastroenterologist to clarify.  If you requested that your care partner not be given the details of your procedure findings, then the procedure report has been included in a sealed envelope for you to review at your convenience later.  YOU SHOULD EXPECT: Some feelings of bloating in the abdomen. Passage of more gas than usual.  Walking can help get rid of the air that was put into your GI tract during the procedure and reduce the bloating. If you had a lower endoscopy (such as a colonoscopy or flexible sigmoidoscopy) you may notice spotting of blood in your stool or on the toilet paper. If you underwent a bowel prep for your procedure, you may not have a normal bowel movement for a few days.  Please Note:  You might notice some irritation and congestion in your nose or some drainage.  This is from the oxygen used during your procedure.  There is no need for concern and it should clear up in a day or so.  SYMPTOMS TO REPORT IMMEDIATELY:   Following lower endoscopy (colonoscopy or flexible sigmoidoscopy):  Excessive amounts of blood in the stool  Significant tenderness or worsening of abdominal pains  Swelling of the abdomen that is new, acute  Fever of 100F or higher  For urgent or emergent issues, a gastroenterologist can be reached at any hour by calling [phone].   DIET:  We do recommend a small meal at first, but then you may proceed to your regular diet.  Drink plenty of fluids but you should avoid alcoholic beverages for 24 hours.  MEDICATIONS: Continue present medications. No ibuprofen, naproxen, or other non-steroidal anti-inflammatory drugs for 2 weeks after polyp removal.  ACTIVITY:  You should plan to take it  easy for the rest of today and you should NOT DRIVE or use heavy machinery until tomorrow (because of the sedation medicines used during the test).    FOLLOW UP: Our staff will call the number listed on your records the next business day following your procedure to check on you and address any questions or concerns that you may have regarding the information given to you following your procedure. If we do not reach you, we will leave a message.  However, if you are feeling well and you are not experiencing any problems, there is no need to return our call.  We will assume that you have returned to your regular daily activities without incident.  If any biopsies were taken you will be contacted by phone or by letter within the next 1-3 weeks.  Please call us at [phone] if you have not heard about the biopsies in 3 weeks.   Thank your for allowing [place] to provide for your healthcare needs today.   SIGNATURES/CONFIDENTIALITY: You and/or your care partner have signed paperwork which will be entered into your electronic medical record.  These signatures attest to the fact that that the information above on your After Visit Summary has been reviewed and is understood.  Full responsibility of the confidentiality of this discharge information lies with you and/or your care-partner.

## 2016-05-31 NOTE — Op Note (Signed)
[facility] Patient Name: Deanna Watson Procedure Date: 05/31/2016 7:35 AM MRN: [id number] Endoscopist: Remo Lipps P. Goro Wenrick MD, MD Age: 66 Referring MD:  Date of Birth: May 08, 1950 Gender: Female Account #: [id number] Procedure:                Colonoscopy Indications:              Screening for malignant neoplasm in the colon, This                            is the patient's first colonoscopy Medicines:                Monitored Anesthesia Care Procedure:                Pre-Anesthesia Assessment:                           - Prior to the procedure, a History and Physical                            was performed, and patient medications and                            allergies were reviewed. The patient's tolerance of                            previous anesthesia was also reviewed. The risks                            and benefits of the procedure and the sedation                            options and risks were discussed with the patient.                            All questions were answered, and informed consent                            was obtained. Prior Anticoagulants: The patient has                            taken no previous anticoagulant or antiplatelet                            agents. ASA Grade Assessment: II - A patient with                            mild systemic disease. After reviewing the risks                            and benefits, the patient was deemed in                            satisfactory condition to undergo the procedure.  After obtaining informed consent, the colonoscope                            was passed under direct vision. Throughout the                            procedure, the patient's blood pressure, pulse, and                            oxygen saturations were monitored continuously. The                            Colonoscope was introduced through the anus and                            advanced to the  the cecum, identified by                            appendiceal orifice and ileocecal valve. The                            colonoscopy was performed without difficulty. The                            patient tolerated the procedure well. The quality                            of the bowel preparation was good. The ileocecal                            valve, appendiceal orifice, and rectum were                            photographed. Scope In: 8:01:32 AM Scope Out: 8:20:34 AM Scope Withdrawal Time: 0 hours 15 minutes 39 seconds  Total Procedure Duration: 0 hours 19 minutes 2 seconds  Findings:                 The perianal and digital rectal examinations were                            normal.                           A 3 mm polyp was found in the sigmoid colon. The                            polyp was sessile. The polyp was removed with a                            cold snare. Resection and retrieval were complete.                           A 5 mm polyp was found in the recto-sigmoid colon.  The polyp was sessile. The polyp was removed with a                            cold snare. Resection and retrieval were complete.                           A 6 mm polyp was found in the recto-sigmoid colon.                            The polyp was pedunculated. The polyp was removed                            with a hot snare. Resection and retrieval were                            complete.                           A 3 mm polyp was found in the rectum. The polyp was                            sessile. The polyp was removed with a cold snare.                            Resection and retrieval were complete.                           Multiple medium-mouthed diverticula were found in                            the left colon.                           The exam was otherwise without abnormality on                            direct and retroflexion views. Complications:             No immediate complications. Estimated blood loss:                            Minimal. Estimated Blood Loss:     Estimated blood loss was minimal. Impression:               - One 3 mm polyp in the sigmoid colon, removed with                            a cold snare. Resected and retrieved.                           - One 5 mm polyp at the recto-sigmoid colon,                            removed with a cold snare. Resected and retrieved.                           -  One 6 mm polyp at the recto-sigmoid colon,                            removed with a hot snare. Resected and retrieved.                           - One 3 mm polyp in the rectum, removed with a cold                            snare. Resected and retrieved.                           - Diverticulosis in the left colon.                           - The examination was otherwise normal on direct                            and retroflexion views. Recommendation:           - Patient has a contact number available for                            emergencies. The signs and symptoms of potential                            delayed complications were discussed with the                            patient. Return to normal activities tomorrow.                            Written discharge instructions were provided to the                            patient.                           - Resume previous diet.                           - Continue present medications.                           - Await pathology results.                           - Repeat colonoscopy is recommended for                            surveillance. The colonoscopy date will be                            determined after pathology results from today's  exam become available for review.                           - No ibuprofen, naproxen, or other non-steroidal                            anti-inflammatory drugs for 2 weeks after polyp                             removal. Remo Lipps P. Metztli Sachdev MD, MD 05/31/2016 8:24:32 AM This report has been signed electronically.

## 2016-06-03 ENCOUNTER — Telehealth: Payer: Self-pay | Admitting: *Deleted

## 2016-06-03 NOTE — Telephone Encounter (Signed)
  Follow up Call-  Call back number 05/31/2016  Post procedure Call Back phone  # [phone]  Permission to leave phone message Yes     Patient questions:  Do you have a fever, pain , or abdominal swelling? No. Pain Score  0 *  Have you tolerated food without any problems? Yes.    Have you been able to return to your normal activities? Yes.    Do you have any questions about your discharge instructions: Diet   No. Medications  No. Follow up visit  No.  Do you have questions or concerns about your Care? No.  Actions: * If pain score is 4 or above: No action needed, pain <4.

## 2016-06-07 ENCOUNTER — Telehealth: Payer: Self-pay | Admitting: Gastroenterology

## 2016-06-07 ENCOUNTER — Encounter: Payer: Self-pay | Admitting: Gastroenterology

## 2016-06-07 NOTE — Telephone Encounter (Signed)
Patient advised to try OTC products of Recticare or  hydrocortizone cream on a glycerin suppository. Patient understands to contact office if not better.

## 2016-06-07 NOTE — Telephone Encounter (Signed)
Patient called back and the nitroglycerin ointment is too expensive, any other suggestions? Possible Recticare?

## 2016-06-07 NOTE — Telephone Encounter (Signed)
Spoke to patient, she has experienced for last 5-6 days rectal pain/swelling, today the pain is the worst. She denies any bleeding, no fever, no abdominal pain. She is having normal bowel movements. Patient has been putting Vicks Vapor Rub on her anus after every bm, states that this has helped in the past but is not now.

## 2016-06-07 NOTE — Telephone Encounter (Signed)
Spoke to patient to let her know that I will be calling in the nitroglycerin ointment to [facility]. I have given patient the phone number and address. Patient advised to also stop using Vicks on the area and to take sitz baths. She understands to contact office if not any better.

## 2016-06-07 NOTE — Telephone Encounter (Signed)
Deanna Watson not sure what is causing her symptoms. Perhaps irritation from the colonoscopy / prep, but she had no hemorrhoids noted on the exam. She could have spasm or small fissure. Recommend topical nitroglycerin ointment 0.125% TID and she should take some Sitz baths. She should stop putting Vicks rub on it, could be making it worse. Thanks. If no improvement she can call back

## 2016-06-07 NOTE — Telephone Encounter (Signed)
Recticare would be fine. She could also try hydrocortizone cream applied on glycerin suppositories over the counter as empiric treatment. Thanks

## 2016-06-13 ENCOUNTER — Telehealth: Payer: Self-pay

## 2016-06-13 NOTE — Telephone Encounter (Signed)
I spoke with the patient who stated that she was assigned to [facility] upon enrollment.  However, the doctor wasn't accepting new patients, so she sees Mauricio Po as her PCP. Duane Lope ([place])

## 2016-07-23 ENCOUNTER — Other Ambulatory Visit: Payer: Self-pay | Admitting: *Deleted

## 2016-07-23 MED ORDER — GABAPENTIN 300 MG PO CAPS: 300.0000 mg | ORAL_CAPSULE | Freq: Three times a day (TID) | ORAL | 0 refills | Status: DC

## 2016-07-23 NOTE — Telephone Encounter (Signed)
Pt left msg on  Triage requesting refuill on the Gabapentin to be sent to [facility]. Verified chart sent rx electronically to [facility]...Johny Chess

## 2016-08-02 LAB — HEMOGLOBIN A1C: Hemoglobin A1C: 6.3

## 2016-08-30 ENCOUNTER — Encounter: Payer: Self-pay | Admitting: Family

## 2016-09-02 ENCOUNTER — Telehealth: Payer: Self-pay | Admitting: Family

## 2016-09-02 MED ORDER — GLIPIZIDE ER 2.5 MG PO TB24: ORAL_TABLET | ORAL | 0 refills | Status: DC

## 2016-09-02 NOTE — Telephone Encounter (Signed)
Medication sent.

## 2016-09-02 NOTE — Telephone Encounter (Signed)
glipiZIDE (GLUCOTROL XL) 2.5 MG 24 hr tablet  Patient is requesting a refill on this medication.   Please send: [place], [place] [phone] (Phone) [phone] (Fax)

## 2016-09-19 ENCOUNTER — Other Ambulatory Visit: Payer: Self-pay

## 2016-09-19 ENCOUNTER — Encounter (HOSPITAL_COMMUNITY): Payer: Self-pay

## 2016-09-19 ENCOUNTER — Emergency Department (HOSPITAL_COMMUNITY)
Admission: EM | Admit: 2016-09-19 | Discharge: 2016-09-19 | Disposition: A | Discharge status: Home / Self Care | Payer: Medicare HMO | Attending: Emergency Medicine | Admitting: Emergency Medicine

## 2016-09-19 ENCOUNTER — Ambulatory Visit (HOSPITAL_COMMUNITY)
Admission: EM | Admit: 2016-09-19 | Discharge: 2016-09-19 | Disposition: A | Discharge status: Other Acute Inpatient Hospital | Payer: Medicare HMO

## 2016-09-19 ENCOUNTER — Emergency Department (HOSPITAL_COMMUNITY): Payer: Medicare HMO

## 2016-09-19 DIAGNOSIS — Z79899 Other long term (current) drug therapy: Secondary | ICD-10-CM | POA: Diagnosis not present

## 2016-09-19 DIAGNOSIS — R109 Unspecified abdominal pain: Secondary | ICD-10-CM | POA: Diagnosis not present

## 2016-09-19 DIAGNOSIS — I10 Essential (primary) hypertension: Secondary | ICD-10-CM | POA: Diagnosis not present

## 2016-09-19 DIAGNOSIS — Z7984 Long term (current) use of oral hypoglycemic drugs: Secondary | ICD-10-CM | POA: Diagnosis not present

## 2016-09-19 DIAGNOSIS — R079 Chest pain, unspecified: Secondary | ICD-10-CM | POA: Diagnosis not present

## 2016-09-19 DIAGNOSIS — J45909 Unspecified asthma, uncomplicated: Secondary | ICD-10-CM | POA: Insufficient documentation

## 2016-09-19 DIAGNOSIS — E119 Type 2 diabetes mellitus without complications: Secondary | ICD-10-CM | POA: Diagnosis not present

## 2016-09-19 DIAGNOSIS — J449 Chronic obstructive pulmonary disease, unspecified: Secondary | ICD-10-CM | POA: Diagnosis not present

## 2016-09-19 DIAGNOSIS — K8020 Calculus of gallbladder without cholecystitis without obstruction: Secondary | ICD-10-CM

## 2016-09-19 DIAGNOSIS — F329 Major depressive disorder, single episode, unspecified: Secondary | ICD-10-CM | POA: Insufficient documentation

## 2016-09-19 DIAGNOSIS — R0602 Shortness of breath: Secondary | ICD-10-CM | POA: Diagnosis not present

## 2016-09-19 LAB — CBC
HCT: 37.9 % (ref 36.0–46.0)
Hemoglobin: 12.5 g/dL (ref 12.0–15.0)
MCH: 28.7 pg (ref 26.0–34.0)
MCHC: 33 g/dL (ref 30.0–36.0)
MCV: 87.1 fL (ref 78.0–100.0)
PLATELETS: 223 10*3/uL (ref 150–400)
RBC: 4.35 MIL/uL (ref 3.87–5.11)
RDW: 12.5 % (ref 11.5–15.5)
WBC: 7 10*3/uL (ref 4.0–10.5)

## 2016-09-19 LAB — I-STAT TROPONIN, ED
TROPONIN I, POC: 0 ng/mL (ref 0.00–0.08)
Troponin i, poc: 0 ng/mL (ref 0.00–0.08)

## 2016-09-19 LAB — BASIC METABOLIC PANEL
Anion gap: 9 (ref 5–15)
BUN: 13 mg/dL (ref 6–20)
CHLORIDE: 102 mmol/L (ref 101–111)
CO2: 29 mmol/L (ref 22–32)
CREATININE: 0.64 mg/dL (ref 0.44–1.00)
Calcium: 10 mg/dL (ref 8.9–10.3)
Glucose, Bld: 81 mg/dL (ref 65–99)
POTASSIUM: 4 mmol/L (ref 3.5–5.1)
SODIUM: 140 mmol/L (ref 135–145)

## 2016-09-19 LAB — HEPATIC FUNCTION PANEL
ALBUMIN: 4 g/dL (ref 3.5–5.0)
ALK PHOS: 75 U/L (ref 38–126)
ALT: 18 U/L (ref 14–54)
AST: 29 U/L (ref 15–41)
BILIRUBIN TOTAL: 0.5 mg/dL (ref 0.3–1.2)
Total Protein: 6.9 g/dL (ref 6.5–8.1)

## 2016-09-19 LAB — LIPASE, BLOOD: LIPASE: 210 U/L — AB (ref 11–51)

## 2016-09-19 MED ORDER — ONDANSETRON 4 MG PO TBDP: 4.0000 mg | ORAL_TABLET | Freq: Three times a day (TID) | ORAL | 0 refills | Status: DC | PRN

## 2016-09-19 MED ORDER — IOPAMIDOL (ISOVUE-300) INJECTION 61%
INTRAVENOUS | Status: AC
  Administered 2016-09-19: 100 mL
  Filled 2016-09-19: qty 100

## 2016-09-19 MED ORDER — OMEPRAZOLE 20 MG PO CPDR: 20.0000 mg | DELAYED_RELEASE_CAPSULE | Freq: Every day | ORAL | 0 refills | Status: DC

## 2016-09-19 MED ORDER — ASPIRIN 81 MG PO CHEW
324.0000 mg | CHEWABLE_TABLET | Freq: Once | ORAL | Status: AC
  Administered 2016-09-19: 324 mg via ORAL
  Filled 2016-09-19: qty 4

## 2016-09-19 MED ORDER — HYDROCODONE-ACETAMINOPHEN 5-325 MG PO TABS: 1.0000 | ORAL_TABLET | ORAL | 0 refills | Status: DC | PRN

## 2016-09-19 MED ORDER — NITROGLYCERIN 0.4 MG SL SUBL
0.4000 mg | SUBLINGUAL_TABLET | SUBLINGUAL | Status: DC | PRN
  Administered 2016-09-19: 0.4 mg via SUBLINGUAL
  Filled 2016-09-19: qty 1

## 2016-09-19 NOTE — ED Provider Notes (Signed)
[facility] DEPT Provider Note   CSN: [id number] Arrival date & time: 09/19/16  1659     History   Chief Complaint Chief Complaint  Patient presents with  . Chest Pain    HPI Maison Agrusa is a 66 y.o. female.  Pt presents to the ED today with cp.  The pt said that she has had pain in the center of chest and in her right arm starting yesterday.  She said it would come and go.  The pt still has some pain now, but it has improved to how it was in the past.  Pt also reports some RUQ pain, but none now.  The pt did have some sob, but has copd and took albuterol which helped.  No n/v.        Past Medical History:  Diagnosis Date  . Arthritis   . Asthma   . Bell's palsy    lasted only about 2 days  . Chicken pox   . COPD (chronic obstructive pulmonary disease) ([place])   . Depression   . Diabetes mellitus without complication ([place])   . Fibromyalgia   . Heart murmur   . History of blood transfusion   . Hyperlipidemia   . Hypertension   . Sciatica   . WPW (Wolff-Parkinson-White syndrome)     Patient Active Problem List   Diagnosis Date Noted  . Medicare annual wellness visit, subsequent 04/04/2016  . Routine adult health maintenance 04/04/2016  . Type 2 diabetes mellitus without complication, without long-term current use of insulin ([place]) 03/01/2016  . Chest pain 03/01/2016  . Essential hypertension 03/01/2016  . Mild episode of recurrent major depressive disorder ([place]) 03/01/2016  . Mixed hyperlipidemia 03/01/2016    Past Surgical History:  Procedure Laterality Date  . CARDIAC ELECTROPHYSIOLOGY [place] AND ABLATION  2009  . REPLACEMENT TOTAL KNEE Left 2005    OB History    No data available       Home Medications    Prior to Admission medications   Medication Sig Start Date End Date Taking? Authorizing Provider  albuterol (PROAIR HFA) 108 (90 Base) MCG/ACT inhaler Inhale 1-2 puffs into the lungs every 6 (six) hours as needed for wheezing or shortness  of breath. 04/04/16  Yes Golden Circle, FNP  budesonide-formoterol (SYMBICORT) 160-4.5 MCG/ACT inhaler Inhale 1 puff into the lungs 2 (two) times daily.   Yes [provider]  celecoxib (CELEBREX) 200 MG capsule Take 200 mg by mouth daily.    Yes [provider]  Cholecalciferol (VITAMIN D PO) Take 1 capsule by mouth daily.   Yes [provider]  escitalopram (LEXAPRO) 20 MG tablet Take 60 mg by mouth daily.    Yes [provider]  gabapentin (NEURONTIN) 300 MG capsule Take 1 capsule (300 mg total) by mouth 3 (three) times daily. Patient taking differently: Take 300 mg by mouth at bedtime.  07/23/16  Yes Golden Circle, FNP  glipiZIDE (GLUCOTROL XL) 2.5 MG 24 hr tablet TAKE 1 TABLET EVERY DAY WITH BREAKFAST Patient taking differently: Take 2.5 mg by mouth daily with breakfast.  09/02/16  Yes Golden Circle, FNP  lisinopril-hydrochlorothiazide (PRINZIDE,ZESTORETIC) 20-12.5 MG tablet Take 1 tablet by mouth daily.   Yes [provider]  lovastatin (MEVACOR) 10 MG tablet Take 10 mg by mouth at bedtime.   Yes [provider]  metFORMIN (GLUCOPHAGE) 500 MG tablet Take 500 mg by mouth 2 (two) times daily with a meal.    Yes [provider]  Multiple Vitamin (MULTIVITAMIN) tablet Take 1 tablet by mouth daily.   Yes [provider]  HYDROcodone-acetaminophen (NORCO/VICODIN) 5-325 MG tablet Take 1 tablet by mouth every 4 (four) hours as needed. 09/19/16   Isla Pence, MD  omeprazole (PRILOSEC) 20 MG capsule Take 1 capsule (20 mg total) by mouth daily. 09/19/16   Isla Pence, MD  ondansetron (ZOFRAN ODT) 4 MG disintegrating tablet Take 1 tablet (4 mg total) by mouth every 8 (eight) hours as needed. 09/19/16   Isla Pence, MD    Family History Family History  Problem Relation Age of Onset  . Arthritis Mother   . Heart disease Mother   . Stroke Mother   . Hypertension Mother   . Kidney disease Mother   . Diabetes Mother    . Alcohol abuse Father   . Epilepsy Father   . Heart disease Brother   . Ovarian cancer Paternal Aunt   . Stomach cancer Paternal Uncle   . Colon cancer Neg Hx     Social History Social History  Substance Use Topics  . Smoking status: Never Smoker  . Smokeless tobacco: Never Used  . Alcohol use No     Allergies   Other   Review of Systems Review of Systems  Cardiovascular: Positive for chest pain.  All other systems reviewed and are negative.    Physical Exam Updated Vital Signs BP 127/66   Pulse 63   Temp 97.8 F (36.6 C) (Oral)   Resp 18   Ht 5' (1.524 m)   Wt 76.2 kg (168 lb)   SpO2 98%   BMI 32.81 kg/m   Physical Exam  Constitutional: She is oriented to person, place, and time. She appears well-developed and well-nourished.  HENT:  Head: Normocephalic and atraumatic.  Right Ear: External ear normal.  Left Ear: External ear normal.  Nose: Nose normal.  Mouth/Throat: Oropharynx is clear and moist.  Eyes: Pupils are equal, round, and reactive to light. Conjunctivae and EOM are normal.  Neck: Normal range of motion. Neck supple.  Cardiovascular: Normal rate, regular rhythm, normal heart sounds and intact distal pulses.   Pulmonary/Chest: Effort normal and breath sounds normal.  Abdominal: Soft. Bowel sounds are normal.  Musculoskeletal: Normal range of motion.  Neurological: She is alert and oriented to person, place, and time.  Skin: Skin is warm.  Psychiatric: She has a normal mood and affect. Her behavior is normal. Judgment and thought content normal.  Nursing note and vitals reviewed.    ED Treatments / Results  Labs (all labs ordered are listed, but only abnormal results are displayed) Labs Reviewed  LIPASE, BLOOD - Abnormal; Notable for the following:       Result Value   Lipase 210 (*)    All other components within normal limits  BASIC METABOLIC PANEL  CBC  HEPATIC FUNCTION PANEL  I-STAT TROPONIN, ED  I-STAT TROPONIN, ED    EKG   EKG Interpretation  Date/Time:  Thursday September 19 2016 17:03:42 EDT Ventricular Rate:  65 PR Interval:  136 QRS Duration: 92 QT Interval:  414 QTC Calculation: 430 R Axis:   8 Text Interpretation:  Normal sinus rhythm Septal infarct , age undetermined Abnormal ECG No old tracing to compare Confirmed by Isla Pence [phone]) on 09/19/2016 7:25:27 PM       Radiology Dg Chest 2 View  Result Date: 09/19/2016 CLINICAL DATA:  Mid chest pain radiating down the right arm and some shortness of breath for the past 2 days.  Ex-smoker. EXAM: CHEST  2 VIEW COMPARISON:  None. FINDINGS: Mildly enlarged cardiac silhouette. Mildly prominent pulmonary vasculature. Diffusely prominent interstitial markings. The lungs are hyperexpanded. No pleural fluid. Thoracic spine degenerative changes and mild scoliosis. IMPRESSION: Mild cardiomegaly and mild pulmonary vascular congestion with probable minimal interstitial pulmonary edema superimposed on changes of COPD and chronic bronchitis. Electronically Signed   By: Claudie Revering M.D.   On: 09/19/2016 20:25   Ct Abdomen Pelvis W Contrast  Result Date: 09/19/2016 CLINICAL DATA:  66 year old female with upper abdominal pain. EXAM: CT ABDOMEN AND PELVIS WITH CONTRAST TECHNIQUE: Multidetector CT imaging of the abdomen and pelvis was performed using the standard protocol following bolus administration of intravenous contrast. CONTRAST:  147mL ISOVUE-300 IOPAMIDOL (ISOVUE-300) INJECTION 61% COMPARISON:  None. FINDINGS: Lower chest: The visualized lung bases are clear. No intra-abdominal free air or free fluid. Hepatobiliary: There is mild diffuse fatty infiltration of the liver. No intrahepatic biliary ductal dilatation. There is a large stone within the gallbladder. No definite pericholecystic fluid. Ultrasound is recommended for better evaluation of the gallbladder. Pancreas: Unremarkable. No pancreatic ductal dilatation or surrounding inflammatory changes. Spleen: Normal in  size without focal abnormality. Adrenals/Urinary Tract: Adrenal glands are unremarkable. Kidneys are normal, without renal calculi, focal lesion, or hydronephrosis. Bladder is unremarkable. Stomach/Bowel: There is moderate stool throughout the colon. Small scattered colonic diverticula without active inflammatory changes. There is no evidence of bowel obstruction or active inflammation. A small hiatal hernia. The appendix is normal. Vascular/Lymphatic: No significant vascular findings are present. No enlarged abdominal or pelvic lymph nodes. Reproductive: Uterus and bilateral adnexa are unremarkable. Other: None Musculoskeletal: Mild degenerative changes of the spine. No acute osseous pathology. IMPRESSION: 1. Cholelithiasis. Ultrasound is recommended for further evaluation of the gallbladder. 2. Mild fatty liver. 3. Moderate colonic stool burden. No bowel obstruction or active inflammation. Normal appendix. 4. Small scattered colonic diverticula. Electronically Signed   By: Anner Crete M.D.   On: 09/19/2016 21:41    Procedures Procedures (including critical care time)  Medications Ordered in ED Medications  nitroGLYCERIN (NITROSTAT) SL tablet 0.4 mg (0.4 mg Sublingual Given 09/19/16 2003)  aspirin chewable tablet 324 mg (324 mg Oral Given 09/19/16 2002)  iopamidol (ISOVUE-300) 61 % injection (100 mLs  Contrast Given 09/19/16 2116)     Initial Impression / Assessment and Plan / ED Course  I have reviewed the triage vital signs and the nursing notes.  Pertinent labs & imaging results that were available during my care of the patient were reviewed by me and considered in my medical decision making (see chart for details).    Ct scan shows cholelithiasis and no pancreatitis.  Pt does not have any RUQ pain currently.  The pt has had 2 sets of negative troponins with a story that does not sound suspicious for CAD.  I think her pain is related to gallstones and likely gerd.  Pt is stable for d/c.  She  is given the number to surgery if sx persist.  She knows to return here for worsening of sx.  Final Clinical Impressions(s) / ED Diagnoses   Final diagnoses:  Calculus of gallbladder without cholecystitis without obstruction    New Prescriptions New Prescriptions   HYDROCODONE-ACETAMINOPHEN (NORCO/VICODIN) 5-325 MG TABLET    Take 1 tablet by mouth every 4 (four) hours as needed.   OMEPRAZOLE (PRILOSEC) 20 MG CAPSULE    Take 1 capsule (20 mg total) by mouth daily.   ONDANSETRON (ZOFRAN ODT) 4 MG DISINTEGRATING TABLET  Take 1 tablet (4 mg total) by mouth every 8 (eight) hours as needed.     Isla Pence, MD 09/19/16 2207

## 2016-09-19 NOTE — ED Notes (Signed)
Pt ambulatory to restroom

## 2016-09-19 NOTE — ED Triage Notes (Signed)
Per Pt, Pt reports mid-center chest pain that started yesterday along with radiation to her neck and right arm. Pt reports that it has continued with some SOB even while resting. Pt takes metformin and has chronic diarrhea.

## 2016-10-15 ENCOUNTER — Ambulatory Visit: Payer: Self-pay | Admitting: Surgery

## 2016-10-15 ENCOUNTER — Encounter: Payer: Self-pay | Admitting: Surgery

## 2016-10-15 DIAGNOSIS — R072 Precordial pain: Secondary | ICD-10-CM | POA: Diagnosis not present

## 2016-10-15 DIAGNOSIS — M79621 Pain in right upper arm: Secondary | ICD-10-CM | POA: Diagnosis not present

## 2016-10-15 DIAGNOSIS — K449 Diaphragmatic hernia without obstruction or gangrene: Secondary | ICD-10-CM | POA: Diagnosis not present

## 2016-10-15 DIAGNOSIS — R4702 Dysphasia: Secondary | ICD-10-CM | POA: Diagnosis not present

## 2016-10-15 DIAGNOSIS — M546 Pain in thoracic spine: Secondary | ICD-10-CM | POA: Diagnosis not present

## 2016-10-15 DIAGNOSIS — D126 Benign neoplasm of colon, unspecified: Secondary | ICD-10-CM | POA: Insufficient documentation

## 2016-10-15 DIAGNOSIS — K5909 Other constipation: Secondary | ICD-10-CM | POA: Diagnosis not present

## 2016-10-15 DIAGNOSIS — R140 Abdominal distension (gaseous): Secondary | ICD-10-CM | POA: Diagnosis not present

## 2016-10-18 ENCOUNTER — Other Ambulatory Visit (HOSPITAL_COMMUNITY): Payer: Self-pay | Admitting: Surgery

## 2016-10-18 DIAGNOSIS — R140 Abdominal distension (gaseous): Secondary | ICD-10-CM

## 2016-10-22 ENCOUNTER — Telehealth: Payer: Self-pay | Admitting: Family

## 2016-10-22 MED ORDER — GLIPIZIDE ER 2.5 MG PO TB24: 2.5000 mg | ORAL_TABLET | Freq: Every day | ORAL | 0 refills | Status: DC

## 2016-10-22 MED ORDER — ESCITALOPRAM OXALATE 20 MG PO TABS: 60.0000 mg | ORAL_TABLET | Freq: Every day | ORAL | 0 refills | Status: DC

## 2016-10-22 MED ORDER — METFORMIN HCL 500 MG PO TABS: 500.0000 mg | ORAL_TABLET | Freq: Two times a day (BID) | ORAL | 0 refills | Status: DC

## 2016-10-22 NOTE — Telephone Encounter (Signed)
Pt needs refill sent [facility]   Glipizide  Metformin  escitalopram

## 2016-10-22 NOTE — Telephone Encounter (Signed)
Sent 90 day to mail service.../lmb 

## 2016-10-24 ENCOUNTER — Encounter (HOSPITAL_COMMUNITY)
Admission: RE | Admit: 2016-10-24 | Discharge: 2016-10-24 | Disposition: A | Discharge status: Home / Self Care | Payer: Medicare HMO | Source: Ambulatory Visit | Attending: Surgery | Admitting: Surgery

## 2016-10-24 DIAGNOSIS — R140 Abdominal distension (gaseous): Secondary | ICD-10-CM | POA: Insufficient documentation

## 2016-10-24 DIAGNOSIS — R109 Unspecified abdominal pain: Secondary | ICD-10-CM | POA: Diagnosis not present

## 2016-10-24 MED ORDER — TECHNETIUM TC 99M SULFUR COLLOID FILTERED
2.0000 | Freq: Once | INTRAVENOUS | Status: AC | PRN
  Administered 2016-10-24: 2 via INTRADERMAL

## 2016-10-25 ENCOUNTER — Telehealth: Payer: Self-pay

## 2016-10-25 NOTE — Telephone Encounter (Signed)
Received fax from [address] stating that pts escitalopram 20 mg exceeds the maximum recommended dosage of 20mg /day for geriatric pts. They are asking you to clarify dosing/directions for pt. Please advise.

## 2016-11-05 NOTE — Telephone Encounter (Signed)
Patient has called back stating that [facility] called her stating they need another script including provider classification.

## 2016-11-06 MED ORDER — ESCITALOPRAM OXALATE 20 MG PO TABS: 20.0000 mg | ORAL_TABLET | Freq: Every day | ORAL | 0 refills | Status: DC

## 2016-11-06 NOTE — Telephone Encounter (Signed)
There was an apparent error when re-ordering medication. The correct dosage is 20 mg and was sent to the pharmacy.

## 2016-11-08 NOTE — Telephone Encounter (Signed)
Left patient vm in regard.  °

## 2016-11-18 ENCOUNTER — Telehealth: Payer: Self-pay | Admitting: Family

## 2016-11-18 MED ORDER — CELECOXIB 200 MG PO CAPS: 200.0000 mg | ORAL_CAPSULE | Freq: Every day | ORAL | 0 refills | Status: DC

## 2016-11-18 NOTE — Telephone Encounter (Signed)
Pt called in and needs a refill on her celecoxib (CELEBREX) 200 MG capsule [[id number]]    Pharmacy on file

## 2016-11-18 NOTE — Telephone Encounter (Signed)
Pt is due for follow-up appt will send 30 day until see MD.../lmb

## 2016-12-03 ENCOUNTER — Telehealth: Payer: Self-pay | Admitting: Family

## 2016-12-03 NOTE — Telephone Encounter (Signed)
Per office policy can only send 30 day to local pharmacy until appt, and then Deanna Watson will be able to send 90 day to mail service. Pt was give a 30 day script on 10/1, which she should not be out...Deanna Watson

## 2016-12-03 NOTE — Telephone Encounter (Signed)
Patient scheduled to transfer to [facility] this month.  Patient is requesting refills on escitalopram to be sent to [facility].

## 2016-12-04 NOTE — Telephone Encounter (Signed)
Notified.  Patient states she will check with her pharmacy.

## 2016-12-05 ENCOUNTER — Other Ambulatory Visit: Payer: Self-pay | Admitting: Family

## 2016-12-11 ENCOUNTER — Encounter: Payer: Self-pay | Admitting: Gastroenterology

## 2016-12-11 ENCOUNTER — Ambulatory Visit (INDEPENDENT_AMBULATORY_CARE_PROVIDER_SITE_OTHER): Payer: Medicare HMO | Admitting: Nurse Practitioner

## 2016-12-11 ENCOUNTER — Encounter: Payer: Self-pay | Admitting: Nurse Practitioner

## 2016-12-11 ENCOUNTER — Ambulatory Visit (INDEPENDENT_AMBULATORY_CARE_PROVIDER_SITE_OTHER): Payer: Medicare HMO | Admitting: Gastroenterology

## 2016-12-11 VITALS — BP 146/68 | HR 70 | Ht 60.0 in | Wt 171.0 lb

## 2016-12-11 VITALS — BP 138/70 | HR 77 | Temp 97.9°F | Resp 16 | Ht 60.0 in | Wt 172.0 lb

## 2016-12-11 DIAGNOSIS — K219 Gastro-esophageal reflux disease without esophagitis: Secondary | ICD-10-CM

## 2016-12-11 DIAGNOSIS — R1013 Epigastric pain: Secondary | ICD-10-CM

## 2016-12-11 DIAGNOSIS — R1310 Dysphagia, unspecified: Secondary | ICD-10-CM

## 2016-12-11 DIAGNOSIS — Z23 Encounter for immunization: Secondary | ICD-10-CM | POA: Diagnosis not present

## 2016-12-11 DIAGNOSIS — F330 Major depressive disorder, recurrent, mild: Secondary | ICD-10-CM | POA: Diagnosis not present

## 2016-12-11 DIAGNOSIS — E1141 Type 2 diabetes mellitus with diabetic mononeuropathy: Secondary | ICD-10-CM | POA: Diagnosis not present

## 2016-12-11 DIAGNOSIS — K760 Fatty (change of) liver, not elsewhere classified: Secondary | ICD-10-CM

## 2016-12-11 DIAGNOSIS — E663 Overweight: Secondary | ICD-10-CM

## 2016-12-11 DIAGNOSIS — G629 Polyneuropathy, unspecified: Secondary | ICD-10-CM | POA: Insufficient documentation

## 2016-12-11 LAB — POCT GLYCOSYLATED HEMOGLOBIN (HGB A1C): Hemoglobin A1C: 6.5

## 2016-12-11 MED ORDER — OMEPRAZOLE 20 MG PO CPDR: 40.0000 mg | DELAYED_RELEASE_CAPSULE | Freq: Every day | ORAL | 0 refills | Status: DC

## 2016-12-11 MED ORDER — FLUOXETINE HCL 20 MG PO TABS: 20.0000 mg | ORAL_TABLET | Freq: Every day | ORAL | 3 refills | Status: DC

## 2016-12-11 MED ORDER — METFORMIN HCL 500 MG PO TABS: 1000.0000 mg | ORAL_TABLET | Freq: Two times a day (BID) | ORAL | 0 refills | Status: DC

## 2016-12-11 MED ORDER — GABAPENTIN 300 MG PO CAPS: 300.0000 mg | ORAL_CAPSULE | Freq: Three times a day (TID) | ORAL | 0 refills | Status: DC

## 2016-12-11 MED ORDER — GABAPENTIN 300 MG PO CAPS: 600.0000 mg | ORAL_CAPSULE | Freq: Three times a day (TID) | ORAL | 0 refills | Status: DC

## 2016-12-11 NOTE — Assessment & Plan Note (Signed)
Chronic, worsening- she did have some relief with initiation of gabapentin but reports incorrect does at home. Gabapentin teaching including proper use and dosage done. Shell take 300 mg three times a day now. She will follow up for continued neuropathic pain with dose adjustment. Will consider vitamin levels, alternate medications for continued symptoms.

## 2016-12-11 NOTE — Assessment & Plan Note (Signed)
Depression is stable with current regimen. She does report headaches which she attributes to lexapro. Will switch to another SSRI to see if headaches resolve. She will stop lexapro and start prozac. Educated on SSRI side effects, instructions given to go immediately to ED for any suicidal thoughts. She will follow up if her headaches persist.

## 2016-12-11 NOTE — Patient Instructions (Addendum)
You have been scheduled for an endoscopy. Please follow written instructions given to you at your visit today. If you use inhalers (even only as needed), please bring them with you on the day of your procedure. Your physician has requested that you go to www.startemmi.com and enter the access code given to you at your visit today. This web site gives a general overview about your procedure. However, you should still follow specific instructions given to you by our office regarding your preparation for the procedure.  We will refer you to a nutritionist.  They will contact you to schedule an appointment.  If you have not heard from them within a week or two, please call us and let us know.  Please continue taking your Omeprazole 40 mg, daily.  If you are age 71 or older, your body mass index should be between 23-30. Your Body mass index is 33.4 kg/m. If this is out of the aforementioned range listed, please consider follow up with your Primary Care Provider.  If you are age 30 or younger, your body mass index should be between 19-25. Your Body mass index is 33.4 kg/m. If this is out of the aformentioned range listed, please consider follow up with your Primary Care Provider.   Thank you.

## 2016-12-11 NOTE — Patient Instructions (Addendum)
I sent a refill of your metformin to the pharmacy, INCREASE THE DOSE to 1,000mg  twice daily. STOP glucotrol XL.  Ive increased your gabapentin to 600mg  three times a day. Please follow up for continued burning and tingling.  STOP lexapro. START prozac. You may continue to experience some side effects such as weight gain, sexual dysfunction, insomnia, headache, nausea with this drug. However, if you continue to have daily headaches please let me know.  I'd like to see you back in 3 months to recheck your A1c level.  It was nice to meet you. Thanks for letting me take care of you today :)

## 2016-12-11 NOTE — Assessment & Plan Note (Addendum)
Fingerstick a1c 6.5 today. She reports some episodes of hypoglycemia at home. Will stop glipizide due to hypoglycemia risk and increase Metformin dose to 1000mg  twice daily. She will continue to monitor her blood sugar at home. She will rturn in 3 months for a1c check and diabetes follow up

## 2016-12-11 NOTE — Progress Notes (Signed)
Subjective:    Patient ID: Deanna Watson, female    DOB: December 15, 1950, 66 y.o.   MRN: [id number]  HPI Deanna Watson is a 66 yo female who presents today to establish care. She  Is transferring to me from another provider in the same clinic.  Diabetes-Maintained on metformin, glipizide. She checks her blood sugar daily at home, readings have been around 130 in the morning and 70s in the afternoons. She notices lower readings on the days she forgets to eat lunch, closer to the 70s. She does experience some symptoms of hypoglycemia on the days that her blood sugar gets low-feeling dizzy and shaky. She denies polyphagia, polyuria, polydipsia, diarrhea.  Lab Results  Component Value Date   HGBA1C 6.5 12/11/2016    Neuropathy- Maintained on neurontin. She experiences numbness and tingling in her hands and feet daily. She describes as painful burning. She noticed some improvement with gabapentin but the burning pain has become worse over the past months. She has only been taking Neurontin 300mg  once daily rather than the prescribed does of 300mg  three times daily because she thought that "three times daily" was a misprint on her prescription and did not clarify with her care provider.  Depression- maintained on lexapro. She is primary caregiver to husband. She experiences some depression at times but this has improved since shes been on lexapro. She feels the lexapro has improved her mood greatly. She does not worry daily or feel down.  She has noticed headaches almost every day since she began the lexapro. The headaches are dull aching In her forehead that mostly occur in the afternoon. She takes tylenol with relief. She denies weakness, vision changes, weight gain, insomnia, nausea. She denies thoughts of harming herself or others. She was on zoloft before lexapro and it did not help her depression.   Review of Systems  See HPI  Past Medical History:  Diagnosis Date  . Arthritis   . Asthma   .  Bell's palsy    lasted only about 2 days  . Chicken pox   . COPD (chronic obstructive pulmonary disease) ([place])   . Depression   . Diabetes mellitus without complication ([place])   . Fibromyalgia   . Heart murmur   . History of blood transfusion   . Hyperlipidemia   . Hypertension   . Sciatica   . WPW (Wolff-Parkinson-White syndrome)      Social History   Social History  . Marital status: Married    Spouse name: N/A  . Number of children: 1  . Years of education: 46   Occupational History  . Retired    Social History Main Topics  . Smoking status: Never Smoker  . Smokeless tobacco: Never Used  . Alcohol use No  . Drug use: No  . Sexual activity: Not on file   Other Topics Concern  . Not on file   Social History Narrative   Denies abuse and feels safe at home.     Past Surgical History:  Procedure Laterality Date  . CARDIAC ELECTROPHYSIOLOGY [place] AND ABLATION  2009  . REPLACEMENT TOTAL KNEE Left 2005    Family History  Problem Relation Age of Onset  . Arthritis Mother   . Heart disease Mother   . Stroke Mother   . Hypertension Mother   . Kidney disease Mother   . Diabetes Mother   . Alcohol abuse Father   . Epilepsy Father   . Heart disease Brother   . Ovarian cancer  Paternal Aunt   . Stomach cancer Paternal Uncle   . Colon cancer Neg Hx     Allergies  Allergen Reactions  . Other     In 1988 shrimp caused airway constriction; has had many times since without reaction    Current Outpatient Prescriptions on File Prior to Visit  Medication Sig Dispense Refill  . albuterol (PROAIR HFA) 108 (90 Base) MCG/ACT inhaler Inhale 1-2 puffs into the lungs every 6 (six) hours as needed for wheezing or shortness of breath. 1 Inhaler 0  . budesonide-formoterol (SYMBICORT) 160-4.5 MCG/ACT inhaler Inhale 1 puff into the lungs 2 (two) times daily.    . celecoxib (CELEBREX) 200 MG capsule Take 1 capsule (200 mg total) by mouth daily. Must see MD for future refills 30  capsule 0  . Cholecalciferol (VITAMIN D PO) Take 1 capsule by mouth daily.    Marland Kitchen lisinopril-hydrochlorothiazide (PRINZIDE,ZESTORETIC) 20-12.5 MG tablet Take 1 tablet by mouth daily.    Marland Kitchen lovastatin (MEVACOR) 10 MG tablet Take 10 mg by mouth at bedtime.    . Multiple Vitamin (MULTIVITAMIN) tablet Take 1 tablet by mouth daily.    Marland Kitchen omeprazole (PRILOSEC) 20 MG capsule Take 2 capsules (40 mg total) by mouth daily. 30 capsule 0   Current Facility-Administered Medications on File Prior to Visit  Medication Dose Route Frequency Provider Last Rate Last Dose  . 0.9 %  sodium chloride infusion  500 mL Intravenous Continuous Armbruster, Carlota Raspberry, MD        BP 138/70 (BP Location: Left Arm, Patient Position: Sitting, Cuff Size: Large)   Pulse 77   Temp 97.9 F (36.6 C) (Oral)   Resp 16   Ht 5' (1.524 m)   Wt 172 lb (78 kg)   SpO2 96%   BMI 33.59 kg/m       Objective:   Physical Exam  Constitutional: She is oriented to person, place, and time. She appears well-developed and well-nourished.  obese  Cardiovascular: Normal rate, regular rhythm and intact distal pulses.   Pulmonary/Chest: Effort normal and breath sounds normal.  Musculoskeletal: Normal range of motion.  Neurological: She is alert and oriented to person, place, and time. She has normal strength. No sensory deficit. Coordination and gait normal.  Reflex Scores:      Patellar reflexes are 1+ on the right side and 1+ on the left side. Skin: Skin is warm and dry.  Psychiatric: She has a normal mood and affect. Her behavior is normal. Judgment and thought content normal.      Assessment & Plan:  Flu shot given today.

## 2016-12-11 NOTE — Progress Notes (Signed)
HPI :  66 y/o female here for a follow up visit. I met her for the first time during her colonoscopy in April which showed one small adenoma.  Since that time she has developed abdominal pain. She had a CT scan in August showing gallstones for which she was referred to surgery. She was seen by Dr. Neysa Bonito and complained of chest pain, dysphagia, abdominal pain. Here to gastric empty study which was normal and requested GI evaluation prior to consideration for cholecystectomy.  She endorses epigastric to right upper quadrant discomfort, that is intermittent. Occasionally precipitated by eating. sometimes not. She doesn't have much of any nausea or vomiting. She endorses dysphagia to both solids and liquids - seems to bother her mostly in the sternal notch area or upper esophagus, she is a hard time saying how long this been going on for. He does have significant regurgitation of gastric contents as well as belching. She has some heartburn which bothers her at night. She's been using Tums as needed. She is given a trial of omeprazole but hasn't taken it. Is never had a prior endoscopy. She takes Celebrex daily.   Of note her CT scan also showed fatty liver. Her liver enzymes are normal. She denies alcohol use or family history of liver disease.   Prior workup: CT abdomen / pelvis 09/19/2016 - gallstones, fatty liver Gastric emptying study 10/24/16 - normal  Colonoscopy 05/31/2016 - 4 small polyps, left sided diverticulosis - 1 adenoma, 2 HP   Past Medical History:  Diagnosis Date  . Arthritis   . Asthma   . Bell's palsy    lasted only about 2 days  . Chicken pox   . COPD (chronic obstructive pulmonary disease) ([place])   . Depression   . Diabetes mellitus without complication ([place])   . Fibromyalgia   . Heart murmur   . History of blood transfusion   . Hyperlipidemia   . Hypertension   . Sciatica   . WPW (Wolff-Parkinson-White syndrome)      Past Surgical History:  Procedure  Laterality Date  . CARDIAC ELECTROPHYSIOLOGY [place] AND ABLATION  2009  . REPLACEMENT TOTAL KNEE Left 2005   Family History  Problem Relation Age of Onset  . Arthritis Mother   . Heart disease Mother   . Stroke Mother   . Hypertension Mother   . Kidney disease Mother   . Diabetes Mother   . Alcohol abuse Father   . Epilepsy Father   . Heart disease Brother   . Ovarian cancer Paternal Aunt   . Stomach cancer Paternal Uncle   . Colon cancer Neg Hx    Social History  Substance Use Topics  . Smoking status: Never Smoker  . Smokeless tobacco: Never Used  . Alcohol use No   Current Outpatient Prescriptions  Medication Sig Dispense Refill  . albuterol (PROAIR HFA) 108 (90 Base) MCG/ACT inhaler Inhale 1-2 puffs into the lungs every 6 (six) hours as needed for wheezing or shortness of breath. 1 Inhaler 0  . budesonide-formoterol (SYMBICORT) 160-4.5 MCG/ACT inhaler Inhale 1 puff into the lungs 2 (two) times daily.    . celecoxib (CELEBREX) 200 MG capsule Take 1 capsule (200 mg total) by mouth daily. Must see MD for future refills 30 capsule 0  . Cholecalciferol (VITAMIN D PO) Take 1 capsule by mouth daily.    Marland Kitchen escitalopram (LEXAPRO) 20 MG tablet Take 1 tablet (20 mg total) by mouth daily. 90 tablet 0  . gabapentin (NEURONTIN)  300 MG capsule Take 1 capsule (300 mg total) by mouth 3 (three) times daily. (Patient taking differently: Take 300 mg by mouth at bedtime. ) 270 capsule 0  . glipiZIDE (GLUCOTROL XL) 2.5 MG 24 hr tablet Take 1 tablet (2.5 mg total) by mouth daily with breakfast. 90 tablet 0  . lisinopril-hydrochlorothiazide (PRINZIDE,ZESTORETIC) 20-12.5 MG tablet Take 1 tablet by mouth daily.    Marland Kitchen lovastatin (MEVACOR) 10 MG tablet Take 10 mg by mouth at bedtime.    . metFORMIN (GLUCOPHAGE) 500 MG tablet Take 1 tablet (500 mg total) by mouth 2 (two) times daily with a meal. 180 tablet 0  . Multiple Vitamin (MULTIVITAMIN) tablet Take 1 tablet by mouth daily.    Marland Kitchen omeprazole  (PRILOSEC) 20 MG capsule Take 1 capsule (20 mg total) by mouth daily. 30 capsule 0  . ondansetron (ZOFRAN ODT) 4 MG disintegrating tablet Take 1 tablet (4 mg total) by mouth every 8 (eight) hours as needed. 10 tablet 0   Current Facility-Administered Medications  Medication Dose Route Frequency Provider Last Rate Last Dose  . 0.9 %  sodium chloride infusion  500 mL Intravenous Continuous Fredda Clarida, Carlota Raspberry, MD       Allergies  Allergen Reactions  . Other     In 1988 shrimp caused airway constriction; has had many times since without reaction     Review of Systems: All systems reviewed and negative except where noted in HPI.   Lab Results  Component Value Date   WBC 7.0 09/19/2016   HGB 12.5 09/19/2016   HCT 37.9 09/19/2016   MCV 87.1 09/19/2016   PLT 223 09/19/2016    Lab Results  Component Value Date   CREATININE 0.64 09/19/2016   BUN 13 09/19/2016   NA 140 09/19/2016   K 4.0 09/19/2016   CL 102 09/19/2016   CO2 29 09/19/2016    Lab Results  Component Value Date   ALT 18 09/19/2016   AST 29 09/19/2016   ALKPHOS 75 09/19/2016   BILITOT 0.5 09/19/2016     Physical Exam: BP (!) 146/68   Pulse 70   Ht 5' (1.524 m)   Wt 171 lb (77.6 kg)   BMI 33.40 kg/m  Constitutional: Pleasant,well-developed, female in no acute distress. HEENT: Normocephalic and atraumatic. Conjunctivae are normal. No scleral icterus. Neck supple.  Cardiovascular: Normal rate, regular rhythm.  Pulmonary/chest: Effort normal and breath sounds normal. No wheezing, rales or rhonchi. Abdominal: Soft, protuberant, mild epigastric to RUQ pain. There are no masses palpable. No hepatomegaly. Extremities: no edema Lymphadenopathy: No cervical adenopathy noted. Neurological: Alert and oriented to person place and time. Skin: Skin is warm and dry. No rashes noted. Psychiatric: Normal mood and affect. Behavior is normal.   ASSESSMENT AND PLAN: 66 year old female here for reassessment of the  following issues:  Upper abdominal pain  - possible this could be biliary colic although is not classic for it. Gastric imaging study is negative. I agree that an EGD is reasonable to evaluate, rule out peptic ulcer disease given her NSAID use, rule out H. Pylori, rule out esophagitis prior to consideration for cholecystectomy. I discussed risks and benefits of EGD with her and she wanted to proceed. In the interim recommend a trial of omeprazole 40 mg once daily to see if this helps. She is agreeable to this. If EGD is negative she may follow-up with Dr. gross for consideration of cholecystectomy if abdominal pain persists despite trial of PPI  Dysphagia / GERD - as  above, we'll evaluate with EGD and potentially dilate as needed. Also assess response to omeprazole once daily.  Fatty liver / Overweight - incidentally noted on imaging. I discussed potential risks associated with fatty liver to include Karlene Lineman and cirrhosis. She denies alcohol use. Fortunately her liver enzymes are normal. I counseled her on the importance of weight loss, she states she has a very hard time with this. I will referred her to nutritionist for weight loss strategies/dieting. Recommend LFTs yearly for monitoring.  Wasilla Cellar, MD [facility] Pager [phone]

## 2016-12-16 ENCOUNTER — Ambulatory Visit (AMBULATORY_SURGERY_CENTER): Payer: Medicare HMO | Admitting: Gastroenterology

## 2016-12-16 ENCOUNTER — Encounter: Payer: Self-pay | Admitting: Gastroenterology

## 2016-12-16 ENCOUNTER — Other Ambulatory Visit: Payer: Self-pay

## 2016-12-16 VITALS — BP 148/76 | HR 63 | Temp 97.8°F | Resp 15 | Ht 60.0 in | Wt 171.0 lb

## 2016-12-16 DIAGNOSIS — R1013 Epigastric pain: Secondary | ICD-10-CM | POA: Diagnosis present

## 2016-12-16 DIAGNOSIS — R1310 Dysphagia, unspecified: Secondary | ICD-10-CM | POA: Diagnosis not present

## 2016-12-16 DIAGNOSIS — K317 Polyp of stomach and duodenum: Secondary | ICD-10-CM | POA: Diagnosis not present

## 2016-12-16 DIAGNOSIS — E1141 Type 2 diabetes mellitus with diabetic mononeuropathy: Secondary | ICD-10-CM

## 2016-12-16 DIAGNOSIS — I456 Pre-excitation syndrome: Secondary | ICD-10-CM | POA: Diagnosis not present

## 2016-12-16 DIAGNOSIS — J45909 Unspecified asthma, uncomplicated: Secondary | ICD-10-CM | POA: Diagnosis not present

## 2016-12-16 DIAGNOSIS — K2970 Gastritis, unspecified, without bleeding: Secondary | ICD-10-CM | POA: Diagnosis not present

## 2016-12-16 DIAGNOSIS — K219 Gastro-esophageal reflux disease without esophagitis: Secondary | ICD-10-CM | POA: Diagnosis not present

## 2016-12-16 DIAGNOSIS — E119 Type 2 diabetes mellitus without complications: Secondary | ICD-10-CM | POA: Diagnosis not present

## 2016-12-16 DIAGNOSIS — K3189 Other diseases of stomach and duodenum: Secondary | ICD-10-CM | POA: Diagnosis not present

## 2016-12-16 DIAGNOSIS — I10 Essential (primary) hypertension: Secondary | ICD-10-CM | POA: Diagnosis not present

## 2016-12-16 DIAGNOSIS — M797 Fibromyalgia: Secondary | ICD-10-CM | POA: Diagnosis not present

## 2016-12-16 DIAGNOSIS — J449 Chronic obstructive pulmonary disease, unspecified: Secondary | ICD-10-CM | POA: Diagnosis not present

## 2016-12-16 MED ORDER — METFORMIN HCL 500 MG PO TABS: 1000.0000 mg | ORAL_TABLET | Freq: Two times a day (BID) | ORAL | 0 refills | Status: DC

## 2016-12-16 MED ORDER — SODIUM CHLORIDE 0.9 % IV SOLN: 500.0000 mL | INTRAVENOUS | Status: DC

## 2016-12-16 NOTE — Patient Instructions (Signed)
Please handouts on Hiatal Hernia, stricture and diet.  YOU HAD AN ENDOSCOPIC PROCEDURE TODAY AT [facility]:   Refer to the procedure report that was given to you for any specific questions about what was found during the examination.  If the procedure report does not answer your questions, please call your gastroenterologist to clarify.  If you requested that your care partner not be given the details of your procedure findings, then the procedure report has been included in a sealed envelope for you to review at your convenience later.  YOU SHOULD EXPECT: Some feelings of bloating in the abdomen. Passage of more gas than usual.  Walking can help get rid of the air that was put into your GI tract during the procedure and reduce the bloating. If you had a lower endoscopy (such as a colonoscopy or flexible sigmoidoscopy) you may notice spotting of blood in your stool or on the toilet paper. If you underwent a bowel prep for your procedure, you may not have a normal bowel movement for a few days.  Please Note:  You might notice some irritation and congestion in your nose or some drainage.  This is from the oxygen used during your procedure.  There is no need for concern and it should clear up in a day or so.  SYMPTOMS TO REPORT IMMEDIATELY:    Following upper endoscopy (EGD)  Vomiting of blood or coffee ground material  New chest pain or pain under the shoulder blades  Painful or persistently difficult swallowing  New shortness of breath  Fever of 100F or higher  Black, tarry-looking stools  For urgent or emergent issues, a gastroenterologist can be reached at any hour by calling [phone].   DIET:  Please see the post dilation diet sheet. Nothing by mouth until 5:00pm, clear liquids from 5-6pm, then soft diet from 6pm for the rest of the day. Tomorrow am  you may proceed to your regular diet.  Drink plenty of fluids but you should avoid alcoholic beverages for 24  hours.  ACTIVITY:  You should plan to take it easy for the rest of today and you should NOT DRIVE or use heavy machinery until tomorrow (because of the sedation medicines used during the test).    FOLLOW UP: Our staff will call the number listed on your records the next business day following your procedure to check on you and address any questions or concerns that you may have regarding the information given to you following your procedure. If we do not reach you, we will leave a message.  However, if you are feeling well and you are not experiencing any problems, there is no need to return our call.  We will assume that you have returned to your regular daily activities without incident.  If any biopsies were taken you will be contacted by phone or by letter within the next 1-3 weeks.  Please call us at [phone] if you have not heard about the biopsies in 3 weeks.    SIGNATURES/CONFIDENTIALITY: You and/or your care partner have signed paperwork which will be entered into your electronic medical record.  These signatures attest to the fact that that the information above on your After Visit Summary has been reviewed and is understood.  Full responsibility of the confidentiality of this discharge information lies with you and/or your care-partner.  Thank you for letting us take care of  Your healthcare needs today.

## 2016-12-16 NOTE — Progress Notes (Signed)
Report to PACU, RN, vss, BBS= Clear.  

## 2016-12-16 NOTE — Progress Notes (Signed)
Called to room to assist during endoscopic procedure.  Patient ID and intended procedure confirmed with present staff. Received instructions for my participation in the procedure from the performing physician.  

## 2016-12-16 NOTE — Op Note (Signed)
[place] Patient Name: Deanna Watson Procedure Date: 12/16/2016 3:40 PM MRN: [id number] Endoscopist: Remo Lipps P. Oprah Camarena MD, MD Age: 66 Referring MD:  Date of Birth: [birth year] Gender: Female Account #: [id number] Procedure:                Upper GI endoscopy Indications:              Epigastric abdominal pain, Dysphagia, Heartburn,                            Unexplained chest pain Medicines:                Monitored Anesthesia Care Procedure:                Pre-Anesthesia Assessment:                           - Prior to the procedure, a History and Physical                            was performed, and patient medications and                            allergies were reviewed. The patient's tolerance of                            previous anesthesia was also reviewed. The risks                            and benefits of the procedure and the sedation                            options and risks were discussed with the patient.                            All questions were answered, and informed consent                            was obtained. Prior Anticoagulants: The patient has                            taken no previous anticoagulant or antiplatelet                            agents. ASA Grade Assessment: II - A patient with                            mild systemic disease. After reviewing the risks                            and benefits, the patient was deemed in                            satisfactory condition to undergo the procedure.  After obtaining informed consent, the endoscope was                            passed under direct vision. Throughout the                            procedure, the patient's blood pressure, pulse, and                            oxygen saturations were monitored continuously. The                            Endoscope was introduced through the mouth, and                            advanced to the second  part of duodenum. The upper                            GI endoscopy was accomplished without difficulty.                            The patient tolerated the procedure well. Scope In: Scope Out: Findings:                 Esophagogastric landmarks were identified: the                            Z-line was found at 32 cm, the gastroesophageal                            junction was found at 32 cm and the upper extent of                            the gastric folds was found at 34 cm from the                            incisors.                           A 2 cm hiatal hernia was present.                           A single dimuntuvie polyp was found 32 cm from the                            incisors at the GEJ. The polyp was removed with a                            cold biopsy forceps. Resection and retrieval were                            complete.  The exam of the esophagus was otherwise normal.                           A guidewire was placed and the scope was withdrawn.                            Empiric dilation was performed in the entire                            esophagus with a Savary dilator with mild                            resistance at 17 mm and 18 mm. Re-look endoscopy                            showed no mucosal wrents. Biopsies were taken with                            a cold forceps in the entire esophagus for                            histology to rule out eosinophilic esophagitis.                           The entire examined stomach was normal. Biopsies                            were taken from the antrum, body, and incisura with                            a cold forceps biopsy for H pylori testing.                           A single 6-7 mm nodule versus prominent fold was                            found in the second portion of the duodenum.                            Biopsies were taken with a cold forceps for                             histology.                           The exam of the duodenum was otherwise normal. Complications:            No immediate complications. Estimated blood loss:                            Minimal. Estimated Blood Loss:     Estimated blood loss was minimal. Impression:               - Esophagogastric landmarks identified.                           -  2 cm hiatal hernia.                           - Esophageal polyp was found. Resected and                            retrieved.                           - Normal esophagus otherwise - empirically dilated                            to 70mm and biopsies taken to rule out eosinophilic                            esophagitis                           - Normal stomach. Biopsies taken to rule out H                            pylori                           - Nodule versus prominent fold found in the                            duodenum. Biopsied.                           Overall, no overt cause for the patient's abdominal                            pain, will await biopsy results. If negative,                            biliary colic remains possible. Recommendation:           - Patient has a contact number available for                            emergencies. The signs and symptoms of potential                            delayed complications were discussed with the                            patient. Return to normal activities tomorrow.                            Written discharge instructions were provided to the                            patient.                           - Resume previous diet.                           -  Continue present medications (continue trial of                            omeprazole).                           - Await pathology results and course following                            dilation. Remo Lipps P. Cintia Gleed MD, MD 12/16/2016 4:04:47 PM This report has been signed electronically.

## 2016-12-17 ENCOUNTER — Telehealth: Payer: Self-pay | Admitting: Nurse Practitioner

## 2016-12-17 ENCOUNTER — Telehealth: Payer: Self-pay | Admitting: *Deleted

## 2016-12-17 MED ORDER — CELECOXIB 200 MG PO CAPS: 200.0000 mg | ORAL_CAPSULE | Freq: Every day | ORAL | 2 refills | Status: DC

## 2016-12-17 NOTE — Telephone Encounter (Signed)
  Follow up Call-  Call back number 12/16/2016 05/31/2016  Post procedure Call Back phone  # [phone]  Permission to leave phone message Yes Yes     Patient questions:  Do you have a fever, pain , or abdominal swelling? No. Pain Score  0 *  Have you tolerated food without any problems? Yes.    Have you been able to return to your normal activities? Yes.    Do you have any questions about your discharge instructions: Diet   No. Medications  No. Follow up visit  No.  Do you have questions or concerns about your Care? No.  Actions: * If pain score is 4 or above: No action needed, pain <4.

## 2016-12-17 NOTE — Telephone Encounter (Signed)
Pt called and would like a refill of her  celecoxib (CELEBREX) 200 MG capsule Please advise  [place] on file

## 2016-12-17 NOTE — Telephone Encounter (Signed)
Reviewed chart pt is up-to-date sent refills to [facility].../lmb  

## 2016-12-20 ENCOUNTER — Encounter: Payer: Self-pay | Admitting: Gastroenterology

## 2017-01-06 ENCOUNTER — Ambulatory Visit: Payer: Self-pay | Admitting: Surgery

## 2017-01-06 DIAGNOSIS — K8010 Calculus of gallbladder with chronic cholecystitis without obstruction: Secondary | ICD-10-CM | POA: Diagnosis not present

## 2017-01-06 DIAGNOSIS — K5909 Other constipation: Secondary | ICD-10-CM | POA: Diagnosis not present

## 2017-01-13 ENCOUNTER — Other Ambulatory Visit: Payer: Self-pay | Admitting: Family

## 2017-01-20 ENCOUNTER — Encounter (HOSPITAL_COMMUNITY): Payer: Self-pay

## 2017-01-20 NOTE — Patient Instructions (Signed)
Deanna Watson  01/20/2017   Your procedure is scheduled on: Thursday, Dec. 6, 2018   Report to [facility] Main  Entrance   Take [place]  elevators to 3rd floor to  [place] at 9:15 AM.   Call this number if you have problems the morning of surgery [phone]    Remember: ONLY 1 PERSON MAY GO WITH YOU TO SHORT STAY TO GET  READY MORNING OF [place].   Do not eat food or drink liquids :After Midnight.    Take these medicines the morning of surgery with A SIP OF WATER: Escitalopram, Gabapentin, Use asthma in haler per normal routine   Bring rescue asthma inhaler day of surgery   Do NOT take evening dose of Metformin the night before surgery   DO NOT TAKE ANY DIABETIC MEDICATIONS DAY OF YOUR SURGERY                               You may not have any metal on your body including hair pins, jewelry, and body  piercings              Do not wear  make-up, lotions, powders, perfumes, or deodorant             Do not wear nail polish.  Do not shave  48 hours prior to surgery.                Do not bring valuables to the hospital. [place].   Contacts, dentures or bridgework may not be worn into surgery.   Patients discharged the day of surgery will not be allowed to drive home.   Name and phone number of your driver:  Special Instructions: N/A              Please read over the following fact sheets you were given: _____________________________________________________________________             [facility] - Preparing for Surgery Before surgery, you can play an important role.  Because skin is not sterile, your skin needs to be as free of germs as possible.  You can reduce the number of germs on your skin by washing with CHG (chlorahexidine gluconate) soap before surgery.  CHG is an antiseptic cleaner which kills germs and bonds with the skin to continue killing germs even after  washing. Please DO NOT use if you have an allergy to CHG or antibacterial soaps.  If your skin becomes reddened/irritated stop using the CHG and inform your nurse when you arrive at Short Stay. Do not shave (including legs and underarms) for at least 48 hours prior to the first CHG shower.  You may shave your face/neck.  Please follow these instructions carefully:  1.  Shower with CHG Soap the night before surgery and the  morning of surgery.  2.  If you choose to wash your hair, wash your hair first as usual with your normal  shampoo.  3.  After you shampoo, rinse your hair and body thoroughly to remove the shampoo.                             4.  Use CHG  as you would any other liquid soap.  You can apply chg directly to the skin and wash.  Gently with a scrungie or clean washcloth.  5.  Apply the CHG Soap to your body ONLY FROM THE NECK DOWN.   Do   not use on face/ open                           Wound or open sores. Avoid contact with eyes, ears mouth and   genitals (private parts).                       Wash face,  Genitals (private parts) with your normal soap.             6.  Wash thoroughly, paying special attention to the area where your    surgery  will be performed.  7.  Thoroughly rinse your body with warm water from the neck down.  8.  DO NOT shower/wash with your normal soap after using and rinsing off the CHG Soap.                9.  Pat yourself dry with a clean towel.            10.  Wear clean pajamas.            11.  Place clean sheets on your bed the night of your first shower and do not  sleep with pets. Day of Surgery : Do not apply any lotions/deodorants the morning of surgery.  Please wear clean clothes to the hospital/surgery center.  FAILURE TO FOLLOW THESE INSTRUCTIONS MAY RESULT IN THE CANCELLATION OF YOUR SURGERY  PATIENT SIGNATURE_________________________________  NURSE  SIGNATURE__________________________________  ________________________________________________________________________

## 2017-01-20 NOTE — Pre-Procedure Instructions (Signed)
The following are in epic: EKG 09/20/16 Hgb A1C 6.5 in office note from [facility] NP 12/11/16 CXR 09/19/16 CT abd/pelvis 09/19/16

## 2017-01-22 ENCOUNTER — Other Ambulatory Visit: Payer: Self-pay

## 2017-01-22 ENCOUNTER — Encounter (HOSPITAL_COMMUNITY)
Admission: RE | Admit: 2017-01-22 | Discharge: 2017-01-22 | Disposition: A | Discharge status: Home / Self Care | Payer: Medicare HMO | Source: Ambulatory Visit | Attending: Surgery | Admitting: Surgery

## 2017-01-22 ENCOUNTER — Encounter (HOSPITAL_COMMUNITY): Payer: Self-pay

## 2017-01-22 DIAGNOSIS — E1151 Type 2 diabetes mellitus with diabetic peripheral angiopathy without gangrene: Secondary | ICD-10-CM | POA: Diagnosis not present

## 2017-01-22 DIAGNOSIS — Z7984 Long term (current) use of oral hypoglycemic drugs: Secondary | ICD-10-CM | POA: Diagnosis not present

## 2017-01-22 DIAGNOSIS — K7581 Nonalcoholic steatohepatitis (NASH): Secondary | ICD-10-CM | POA: Diagnosis not present

## 2017-01-22 DIAGNOSIS — Z86010 Personal history of colonic polyps: Secondary | ICD-10-CM | POA: Diagnosis not present

## 2017-01-22 DIAGNOSIS — G8929 Other chronic pain: Secondary | ICD-10-CM | POA: Diagnosis not present

## 2017-01-22 DIAGNOSIS — J449 Chronic obstructive pulmonary disease, unspecified: Secondary | ICD-10-CM | POA: Diagnosis not present

## 2017-01-22 DIAGNOSIS — Z7951 Long term (current) use of inhaled steroids: Secondary | ICD-10-CM | POA: Diagnosis not present

## 2017-01-22 DIAGNOSIS — E1140 Type 2 diabetes mellitus with diabetic neuropathy, unspecified: Secondary | ICD-10-CM | POA: Diagnosis not present

## 2017-01-22 DIAGNOSIS — M797 Fibromyalgia: Secondary | ICD-10-CM | POA: Diagnosis not present

## 2017-01-22 DIAGNOSIS — K8010 Calculus of gallbladder with chronic cholecystitis without obstruction: Secondary | ICD-10-CM | POA: Diagnosis not present

## 2017-01-22 DIAGNOSIS — R1010 Upper abdominal pain, unspecified: Secondary | ICD-10-CM | POA: Diagnosis present

## 2017-01-22 DIAGNOSIS — F330 Major depressive disorder, recurrent, mild: Secondary | ICD-10-CM | POA: Diagnosis not present

## 2017-01-22 DIAGNOSIS — Z6833 Body mass index (BMI) 33.0-33.9, adult: Secondary | ICD-10-CM | POA: Diagnosis not present

## 2017-01-22 DIAGNOSIS — I10 Essential (primary) hypertension: Secondary | ICD-10-CM | POA: Diagnosis not present

## 2017-01-22 DIAGNOSIS — Z791 Long term (current) use of non-steroidal anti-inflammatories (NSAID): Secondary | ICD-10-CM | POA: Diagnosis not present

## 2017-01-22 DIAGNOSIS — Z79899 Other long term (current) drug therapy: Secondary | ICD-10-CM | POA: Diagnosis not present

## 2017-01-22 HISTORY — DX: Anemia, unspecified: D64.9

## 2017-01-22 HISTORY — DX: Other specified postprocedural states: Z98.890

## 2017-01-22 HISTORY — DX: Cardiomegaly: I51.7

## 2017-01-22 HISTORY — DX: Polyneuropathy, unspecified: G62.9

## 2017-01-22 HISTORY — DX: Other specified postprocedural states: R11.2

## 2017-01-22 HISTORY — DX: Unspecified chronic bronchitis: J42

## 2017-01-22 HISTORY — DX: Fatty (change of) liver, not elsewhere classified: K76.0

## 2017-01-22 LAB — BASIC METABOLIC PANEL
ANION GAP: 7 (ref 5–15)
BUN: 17 mg/dL (ref 6–20)
CALCIUM: 9.4 mg/dL (ref 8.9–10.3)
CO2: 29 mmol/L (ref 22–32)
Chloride: 102 mmol/L (ref 101–111)
Creatinine, Ser: 0.7 mg/dL (ref 0.44–1.00)
Glucose, Bld: 230 mg/dL — ABNORMAL HIGH (ref 65–99)
POTASSIUM: 4.2 mmol/L (ref 3.5–5.1)
SODIUM: 138 mmol/L (ref 135–145)

## 2017-01-22 LAB — CBC
HEMATOCRIT: 35.1 % — AB (ref 36.0–46.0)
HEMOGLOBIN: 11.6 g/dL — AB (ref 12.0–15.0)
MCH: 28.8 pg (ref 26.0–34.0)
MCHC: 33 g/dL (ref 30.0–36.0)
MCV: 87.1 fL (ref 78.0–100.0)
Platelets: 202 10*3/uL (ref 150–400)
RBC: 4.03 MIL/uL (ref 3.87–5.11)
RDW: 12.1 % (ref 11.5–15.5)
WBC: 5.1 10*3/uL (ref 4.0–10.5)

## 2017-01-22 LAB — GLUCOSE, CAPILLARY: GLUCOSE-CAPILLARY: 226 mg/dL — AB (ref 65–99)

## 2017-01-22 NOTE — Pre-Procedure Instructions (Signed)
CBC and BMP results faxed to Dr. Johney Maine via epic.

## 2017-01-23 ENCOUNTER — Encounter (HOSPITAL_COMMUNITY): Payer: Self-pay | Admitting: *Deleted

## 2017-01-23 ENCOUNTER — Ambulatory Visit (HOSPITAL_COMMUNITY): Payer: Medicare HMO | Admitting: Anesthesiology

## 2017-01-23 ENCOUNTER — Ambulatory Visit (HOSPITAL_COMMUNITY)
Admission: RE | Admit: 2017-01-23 | Discharge: 2017-01-23 | Disposition: A | Discharge status: Home / Self Care | Payer: Medicare HMO | Source: Ambulatory Visit | Attending: Surgery | Admitting: Surgery

## 2017-01-23 ENCOUNTER — Other Ambulatory Visit: Payer: Self-pay

## 2017-01-23 ENCOUNTER — Encounter (HOSPITAL_COMMUNITY)
Admission: RE | Disposition: A | Discharge status: Home / Self Care | Payer: Self-pay | Source: Ambulatory Visit | Attending: Surgery

## 2017-01-23 DIAGNOSIS — E782 Mixed hyperlipidemia: Secondary | ICD-10-CM | POA: Diagnosis not present

## 2017-01-23 DIAGNOSIS — M797 Fibromyalgia: Secondary | ICD-10-CM | POA: Diagnosis not present

## 2017-01-23 DIAGNOSIS — K8010 Calculus of gallbladder with chronic cholecystitis without obstruction: Secondary | ICD-10-CM | POA: Diagnosis not present

## 2017-01-23 DIAGNOSIS — K760 Fatty (change of) liver, not elsewhere classified: Secondary | ICD-10-CM | POA: Diagnosis not present

## 2017-01-23 DIAGNOSIS — E1151 Type 2 diabetes mellitus with diabetic peripheral angiopathy without gangrene: Secondary | ICD-10-CM | POA: Insufficient documentation

## 2017-01-23 DIAGNOSIS — E1140 Type 2 diabetes mellitus with diabetic neuropathy, unspecified: Secondary | ICD-10-CM | POA: Diagnosis not present

## 2017-01-23 DIAGNOSIS — K7581 Nonalcoholic steatohepatitis (NASH): Secondary | ICD-10-CM | POA: Diagnosis not present

## 2017-01-23 DIAGNOSIS — K811 Chronic cholecystitis: Secondary | ICD-10-CM | POA: Diagnosis not present

## 2017-01-23 DIAGNOSIS — Z7984 Long term (current) use of oral hypoglycemic drugs: Secondary | ICD-10-CM | POA: Insufficient documentation

## 2017-01-23 DIAGNOSIS — J449 Chronic obstructive pulmonary disease, unspecified: Secondary | ICD-10-CM | POA: Insufficient documentation

## 2017-01-23 DIAGNOSIS — Z7951 Long term (current) use of inhaled steroids: Secondary | ICD-10-CM | POA: Insufficient documentation

## 2017-01-23 DIAGNOSIS — E119 Type 2 diabetes mellitus without complications: Secondary | ICD-10-CM

## 2017-01-23 DIAGNOSIS — G8929 Other chronic pain: Secondary | ICD-10-CM | POA: Insufficient documentation

## 2017-01-23 DIAGNOSIS — Z79899 Other long term (current) drug therapy: Secondary | ICD-10-CM | POA: Insufficient documentation

## 2017-01-23 DIAGNOSIS — I10 Essential (primary) hypertension: Secondary | ICD-10-CM | POA: Diagnosis present

## 2017-01-23 DIAGNOSIS — Z6833 Body mass index (BMI) 33.0-33.9, adult: Secondary | ICD-10-CM | POA: Insufficient documentation

## 2017-01-23 DIAGNOSIS — Z86010 Personal history of colonic polyps: Secondary | ICD-10-CM | POA: Insufficient documentation

## 2017-01-23 DIAGNOSIS — Z791 Long term (current) use of non-steroidal anti-inflammatories (NSAID): Secondary | ICD-10-CM | POA: Insufficient documentation

## 2017-01-23 DIAGNOSIS — F330 Major depressive disorder, recurrent, mild: Secondary | ICD-10-CM | POA: Insufficient documentation

## 2017-01-23 HISTORY — PX: LIVER BIOPSY: SHX301

## 2017-01-23 HISTORY — PX: LAPAROSCOPIC CHOLECYSTECTOMY SINGLE SITE WITH INTRAOPERATIVE CHOLANGIOGRAM: SHX6538

## 2017-01-23 LAB — GLUCOSE, CAPILLARY
GLUCOSE-CAPILLARY: 167 mg/dL — AB (ref 65–99)
Glucose-Capillary: 253 mg/dL — ABNORMAL HIGH (ref 65–99)

## 2017-01-23 SURGERY — LAPAROSCOPIC CHOLECYSTECTOMY SINGLE SITE WITH INTRAOPERATIVE CHOLANGIOGRAM
Anesthesia: General

## 2017-01-23 MED ORDER — PROMETHAZINE HCL 25 MG/ML IJ SOLN: 6.2500 mg | INTRAMUSCULAR | Status: DC | PRN

## 2017-01-23 MED ORDER — ACETAMINOPHEN 500 MG PO TABS
1000.0000 mg | ORAL_TABLET | ORAL | Status: AC
  Administered 2017-01-23: 1000 mg via ORAL
  Filled 2017-01-23: qty 2

## 2017-01-23 MED ORDER — ONDANSETRON HCL 4 MG/2ML IJ SOLN
INTRAMUSCULAR | Status: AC
  Filled 2017-01-23: qty 2

## 2017-01-23 MED ORDER — SUGAMMADEX SODIUM 200 MG/2ML IV SOLN
INTRAVENOUS | Status: DC | PRN
  Administered 2017-01-23: 160 mg via INTRAVENOUS

## 2017-01-23 MED ORDER — HYDROMORPHONE HCL 1 MG/ML IJ SOLN
INTRAMUSCULAR | Status: AC
  Filled 2017-01-23: qty 1

## 2017-01-23 MED ORDER — CHLORHEXIDINE GLUCONATE CLOTH 2 % EX PADS: 6.0000 | MEDICATED_PAD | Freq: Once | CUTANEOUS | Status: DC

## 2017-01-23 MED ORDER — FENTANYL CITRATE (PF) 100 MCG/2ML IJ SOLN
INTRAMUSCULAR | Status: DC | PRN
  Administered 2017-01-23 (×2): 50 ug via INTRAVENOUS
  Administered 2017-01-23: 100 ug via INTRAVENOUS
  Administered 2017-01-23: 50 ug via INTRAVENOUS

## 2017-01-23 MED ORDER — TRAMADOL HCL 50 MG PO TABS: 50.0000 mg | ORAL_TABLET | Freq: Four times a day (QID) | ORAL | 0 refills | Status: DC | PRN

## 2017-01-23 MED ORDER — DEXAMETHASONE SODIUM PHOSPHATE 10 MG/ML IJ SOLN
INTRAMUSCULAR | Status: DC | PRN
  Administered 2017-01-23: 10 mg via INTRAVENOUS

## 2017-01-23 MED ORDER — GABAPENTIN 300 MG PO CAPS
300.0000 mg | ORAL_CAPSULE | ORAL | Status: AC
  Administered 2017-01-23: 300 mg via ORAL
  Filled 2017-01-23: qty 1

## 2017-01-23 MED ORDER — LIDOCAINE 2% (20 MG/ML) 5 ML SYRINGE
INTRAMUSCULAR | Status: DC | PRN
  Administered 2017-01-23: 100 mg via INTRAVENOUS

## 2017-01-23 MED ORDER — ROCURONIUM BROMIDE 50 MG/5ML IV SOSY
PREFILLED_SYRINGE | INTRAVENOUS | Status: AC
  Filled 2017-01-23: qty 5

## 2017-01-23 MED ORDER — SUCCINYLCHOLINE CHLORIDE 200 MG/10ML IV SOSY
PREFILLED_SYRINGE | INTRAVENOUS | Status: DC | PRN
  Administered 2017-01-23: 160 mg via INTRAVENOUS

## 2017-01-23 MED ORDER — RINGERS IRRIGATION IR SOLN
Status: DC | PRN
  Administered 2017-01-23: 2000 mL

## 2017-01-23 MED ORDER — IOPAMIDOL (ISOVUE-300) INJECTION 61%
INTRAVENOUS | Status: DC | PRN
  Administered 2017-01-23: 10 mL

## 2017-01-23 MED ORDER — ROCURONIUM BROMIDE 10 MG/ML (PF) SYRINGE
PREFILLED_SYRINGE | INTRAVENOUS | Status: DC | PRN
  Administered 2017-01-23: 30 mg via INTRAVENOUS
  Administered 2017-01-23 (×2): 10 mg via INTRAVENOUS

## 2017-01-23 MED ORDER — TRAMADOL HCL 50 MG PO TABS
50.0000 mg | ORAL_TABLET | Freq: Four times a day (QID) | ORAL | Status: DC | PRN
  Administered 2017-01-23: 50 mg via ORAL
  Filled 2017-01-23: qty 1

## 2017-01-23 MED ORDER — ONDANSETRON HCL 4 MG/2ML IJ SOLN
INTRAMUSCULAR | Status: DC | PRN
  Administered 2017-01-23: 4 mg via INTRAVENOUS

## 2017-01-23 MED ORDER — LIDOCAINE 2% (20 MG/ML) 5 ML SYRINGE
INTRAMUSCULAR | Status: AC
  Filled 2017-01-23: qty 5

## 2017-01-23 MED ORDER — TRAMADOL HCL 50 MG PO TABS: 100.0000 mg | ORAL_TABLET | Freq: Four times a day (QID) | ORAL | Status: DC | PRN

## 2017-01-23 MED ORDER — IOPAMIDOL (ISOVUE-300) INJECTION 61%
INTRAVENOUS | Status: AC
  Filled 2017-01-23: qty 50

## 2017-01-23 MED ORDER — MIDAZOLAM HCL 2 MG/2ML IJ SOLN
INTRAMUSCULAR | Status: DC | PRN
  Administered 2017-01-23: 2 mg via INTRAVENOUS

## 2017-01-23 MED ORDER — BUPIVACAINE-EPINEPHRINE 0.25% -1:200000 IJ SOLN
INTRAMUSCULAR | Status: AC
  Filled 2017-01-23: qty 1

## 2017-01-23 MED ORDER — BUPIVACAINE-EPINEPHRINE 0.25% -1:200000 IJ SOLN
INTRAMUSCULAR | Status: DC | PRN
  Administered 2017-01-23: 50 mL

## 2017-01-23 MED ORDER — PHENYLEPHRINE 40 MCG/ML (10ML) SYRINGE FOR IV PUSH (FOR BLOOD PRESSURE SUPPORT)
PREFILLED_SYRINGE | INTRAVENOUS | Status: DC | PRN
  Administered 2017-01-23 (×2): 80 ug via INTRAVENOUS

## 2017-01-23 MED ORDER — LACTATED RINGERS IV SOLN
INTRAVENOUS | Status: DC
  Administered 2017-01-23: 10:00:00 via INTRAVENOUS

## 2017-01-23 MED ORDER — DEXAMETHASONE SODIUM PHOSPHATE 10 MG/ML IJ SOLN
INTRAMUSCULAR | Status: AC
  Filled 2017-01-23: qty 1

## 2017-01-23 MED ORDER — PROPOFOL 10 MG/ML IV BOLUS
INTRAVENOUS | Status: DC | PRN
  Administered 2017-01-23: 150 mg via INTRAVENOUS

## 2017-01-23 MED ORDER — MIDAZOLAM HCL 2 MG/2ML IJ SOLN
INTRAMUSCULAR | Status: AC
  Filled 2017-01-23: qty 2

## 2017-01-23 MED ORDER — PROPOFOL 10 MG/ML IV BOLUS
INTRAVENOUS | Status: AC
  Filled 2017-01-23: qty 20

## 2017-01-23 MED ORDER — FENTANYL CITRATE (PF) 250 MCG/5ML IJ SOLN
INTRAMUSCULAR | Status: AC
  Filled 2017-01-23: qty 5

## 2017-01-23 MED ORDER — HYDROMORPHONE HCL 1 MG/ML IJ SOLN
0.2500 mg | INTRAMUSCULAR | Status: DC | PRN
  Administered 2017-01-23 (×2): 0.5 mg via INTRAVENOUS

## 2017-01-23 SURGICAL SUPPLY — 38 items
APPLIER CLIP 5 13 M/L LIGAMAX5 (MISCELLANEOUS) ×2
CABLE HIGH FREQUENCY MONO STRZ (ELECTRODE) ×2 IMPLANT
CHLORAPREP W/TINT 26ML (MISCELLANEOUS) ×2 IMPLANT
CLIP APPLIE 5 13 M/L LIGAMAX5 (MISCELLANEOUS) ×1 IMPLANT
COVER MAYO STAND STRL (DRAPES) ×2 IMPLANT
COVER SURGICAL LIGHT HANDLE (MISCELLANEOUS) ×2 IMPLANT
DECANTER SPIKE VIAL GLASS SM (MISCELLANEOUS) ×2 IMPLANT
DRAIN CHANNEL 19F RND (DRAIN) IMPLANT
DRAPE C-ARM 42X120 X-RAY (DRAPES) ×2 IMPLANT
DRAPE WARM FLUID 44X44 (DRAPE) ×2 IMPLANT
DRSG TEGADERM 4X4.75 (GAUZE/BANDAGES/DRESSINGS) ×2 IMPLANT
ELECT REM PT RETURN 15FT ADLT (MISCELLANEOUS) ×2 IMPLANT
ENDOLOOP SUT PDS II  0 18 (SUTURE)
ENDOLOOP SUT PDS II 0 18 (SUTURE) IMPLANT
EVACUATOR SILICONE 100CC (DRAIN) IMPLANT
GAUZE SPONGE 2X2 8PLY STRL LF (GAUZE/BANDAGES/DRESSINGS) ×1 IMPLANT
GLOVE ECLIPSE 8.0 STRL XLNG CF (GLOVE) ×2 IMPLANT
GLOVE INDICATOR 8.0 STRL GRN (GLOVE) ×2 IMPLANT
GOWN STRL REUS W/TWL XL LVL3 (GOWN DISPOSABLE) ×4 IMPLANT
IRRIG SUCT STRYKERFLOW 2 WTIP (MISCELLANEOUS) ×2
IRRIGATION SUCT STRKRFLW 2 WTP (MISCELLANEOUS) ×1 IMPLANT
KIT BASIN OR (CUSTOM PROCEDURE TRAY) ×2 IMPLANT
PAD POSITIONING PINK XL (MISCELLANEOUS) ×2 IMPLANT
POSITIONER SURGICAL ARM (MISCELLANEOUS) IMPLANT
POUCH SPECIMEN RETRIEVAL 10MM (ENDOMECHANICALS) IMPLANT
SCISSORS LAP 5X35 DISP (ENDOMECHANICALS) ×2 IMPLANT
SET CHOLANGIOGRAPH MIX (MISCELLANEOUS) ×2 IMPLANT
SHEARS HARMONIC ACE PLUS 36CM (ENDOMECHANICALS) ×4 IMPLANT
SPONGE GAUZE 2X2 STER 10/PKG (GAUZE/BANDAGES/DRESSINGS) ×1
SUT MNCRL AB 4-0 PS2 18 (SUTURE) ×2 IMPLANT
SUT PDS AB 1 CT1 27 (SUTURE) ×6 IMPLANT
SYR 20CC LL (SYRINGE) ×2 IMPLANT
TOWEL OR 17X26 10 PK STRL BLUE (TOWEL DISPOSABLE) ×2 IMPLANT
TOWEL OR NON WOVEN STRL DISP B (DISPOSABLE) ×2 IMPLANT
TRAY LAPAROSCOPIC (CUSTOM PROCEDURE TRAY) ×2 IMPLANT
TROCAR BLADELESS OPT 5 100 (ENDOMECHANICALS) ×2 IMPLANT
TROCAR BLADELESS OPT 5 150 (ENDOMECHANICALS) ×2 IMPLANT
TUBING INSUF HEATED (TUBING) ×2 IMPLANT

## 2017-01-23 NOTE — Anesthesia Procedure Notes (Signed)
Procedure Name: Intubation Date/Time: 01/23/2017 11:32 AM Performed by: Stevana Dufner D, CRNA Pre-anesthesia Checklist: Patient identified, Emergency Drugs available, Suction available and Patient being monitored Patient Re-evaluated:Patient Re-evaluated prior to induction Oxygen Delivery Method: Circle system utilized Preoxygenation: Pre-oxygenation with 100% oxygen Induction Type: IV induction Ventilation: Mask ventilation without difficulty Grade View: Grade I Tube type: Oral Tube size: 7.5 mm Number of attempts: 1 Airway Equipment and Method: Stylet and Oral airway Placement Confirmation: ETT inserted through vocal cords under direct vision,  positive ETCO2 and breath sounds checked- equal and bilateral Secured at: 21 cm Tube secured with: Tape Dental Injury: Teeth and Oropharynx as per pre-operative assessment

## 2017-01-23 NOTE — Anesthesia Preprocedure Evaluation (Addendum)
Anesthesia Evaluation  Patient identified by MRN, date of birth, ID band Patient awake    Reviewed: Allergy & Precautions, NPO status , Patient's Chart, lab work & pertinent test results  History of Anesthesia Complications (+) PONV and history of anesthetic complications  Airway Mallampati: III  TM Distance: <3 FB Neck ROM: Limited    Dental   Pulmonary asthma ,    breath sounds clear to auscultation       Cardiovascular hypertension, + Peripheral Vascular Disease   Rhythm:Regular Rate:Normal     Neuro/Psych    GI/Hepatic GERD  ,  Endo/Other  diabetes, Well Controlled, Type 2Morbid obesity  Renal/GU      Musculoskeletal   Abdominal (+) + obese,   Peds  Hematology   Anesthesia Other Findings   Reproductive/Obstetrics                            Anesthesia Physical Anesthesia Plan  ASA: III  Anesthesia Plan: General   Post-op Pain Management:    Induction: Intravenous  PONV Risk Score and Plan: 4 or greater and Ondansetron, Dexamethasone, Scopolamine patch - Pre-op and Treatment may vary due to age or medical condition  Airway Management Planned: Oral ETT and Video Laryngoscope Planned  Additional Equipment:   Intra-op Plan:   Post-operative Plan:   Informed Consent: I have reviewed the patients History and Physical, chart, labs and discussed the procedure including the risks, benefits and alternatives for the proposed anesthesia with the patient or authorized representative who has indicated his/her understanding and acceptance.   Dental advisory given  Plan Discussed with: CRNA  Anesthesia Plan Comments:         Anesthesia Quick Evaluation

## 2017-01-23 NOTE — Transfer of Care (Signed)
Immediate Anesthesia Transfer of Care Note  Patient: Deanna Watson  Procedure(s) Performed: LAPAROSCOPIC CHOLECYSTECTOMY SINGLE SITE WITH INTRAOPERATIVE CHOLANGIOGRAM ERAS PATHWAY (N/A ) BIOPSY OF LIVER (N/A )  Patient Location: PACU  Anesthesia Type:General  Level of Consciousness: awake  Airway & Oxygen Therapy: Patient Spontanous Breathing and Patient connected to face mask oxygen  Post-op Assessment: Report given to RN and Post -op Vital signs reviewed and stable  Post vital signs: Reviewed and stable  Last Vitals:  Vitals:   01/23/17 0925  BP: (!) 182/83  Pulse: 66  Resp: 16  Temp: (!) 36.4 C  SpO2: 100%    Last Pain:  Vitals:   01/23/17 0925  TempSrc: Oral      Patients Stated Pain Goal: 3 (68/61/68 3729)  Complications: No apparent anesthesia complications

## 2017-01-23 NOTE — Anesthesia Postprocedure Evaluation (Signed)
Anesthesia Post Note  Patient: Deanna Watson  Procedure(s) Performed: LAPAROSCOPIC CHOLECYSTECTOMY SINGLE SITE WITH INTRAOPERATIVE CHOLANGIOGRAM ERAS PATHWAY (N/A ) BIOPSY OF LIVER (N/A )     Patient location during evaluation: PACU Anesthesia Type: General Level of consciousness: awake and sedated Pain management: pain level controlled Vital Signs Assessment: post-procedure vital signs reviewed and stable Respiratory status: spontaneous breathing, nonlabored ventilation, respiratory function stable and patient connected to nasal cannula oxygen Cardiovascular status: blood pressure returned to baseline and stable Postop Assessment: no apparent nausea or vomiting Anesthetic complications: no    Last Vitals:  Vitals:   01/23/17 1415 01/23/17 1432  BP: (!) 177/79 (!) 162/72  Pulse: 94 96  Resp: 12 12  Temp: 36.7 C 36.7 C  SpO2: 98% 94%    Last Pain:  Vitals:   01/23/17 1415  TempSrc:   PainSc: Asleep                 Guneet Delpino,JAMES TERRILL

## 2017-01-23 NOTE — Op Note (Addendum)
01/23/2017  PATIENT:  Deanna Watson  66 y.o. female  Patient Care Team: Lance Sell, NP as PCP - General (Nurse Practitioner) Havery Moros, Carlota Raspberry, MD as Consulting Physician (Gastroenterology) Michael Boston, MD as Consulting Physician (General Surgery)  PRE-OPERATIVE DIAGNOSIS:    Chronic [place] cholecystitis  POST-OPERATIVE DIAGNOSIS:   Chronic [place] cholecystitis Liver: Fatty steatohepatitis  Rochele Raring syndrome  PROCEDURE:  Core Liver Biopsy & SINGLE SITE Laparoscopic cholecystectomy with intraoperative cholangiogram  SURGEON:  Adin Hector, MD, FACS.  ASSISTANT: OR Staff   ANESTHESIA:    General with endotracheal intubation Local anesthetic as a field block  EBL:  (See Anesthesia Intraoperative Record) Total I/O In: 1000 [I.V.:1000] Out: 66 [Blood:50]  Delay start of Pharmacological VTE agent (>24hrs) due to surgical blood loss or risk of bleeding:  no  DRAINS: None   SPECIMEN: Gallbladder & Core liver biopsies    DISPOSITION OF SPECIMEN:  PATHOLOGY  COUNTS:  YES  PLAN OF CARE: Discharge to home after PACU  PATIENT DISPOSITION:  PACU - hemodynamically stable.  INDICATION: Pleasant obese Hispanic female with intermittent abdominal pains and nausea.  Extensive workup.  Suspicious for biliary colic and chronic cholecystitis.  I offered cholecystectomy  The anatomy & physiology of hepatobiliary & pancreatic function was discussed.  The pathophysiology of gallbladder dysfunction was discussed.  Natural history risks without surgery was discussed.   I feel the risks of no intervention will lead to serious problems that outweigh the operative risks; therefore, I recommended cholecystectomy to remove the pathology.  I explained laparoscopic techniques with possible need for an open approach.  Probable cholangiogram to evaluate the bilary tract was explained as well.    Risks such as bleeding, infection, abscess, leak, injury to other organs,  need for further treatment, heart attack, death, and other risks were discussed.  I noted a good likelihood this will help address the problem.  Possibility that this will not correct all abdominal symptoms was explained.  Goals of post-operative recovery were discussed as well.  We will work to minimize complications.  An educational handout further explaining the pathology and treatment options was given as well.  Questions were answered.  The patient expresses understanding & wishes to proceed with surgery.  OR FINDINGS: Moderate adhesions to anterior gallbladder wall with no bladder wall thickening.  Suspicious for chronic cholecystitis.  Narrow cystic duct with cystic duct stones impacted in the cystic duct.  Cholangiogram with classic anatomy.  Some distal swirling hyperlucencies.  Most likely consistent with sludge.  Not obstructing.  No definite distal common bile duct stones.  Liver: Fatty steatohepatitis.  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  Adhesions lysed  DESCRIPTION:   The patient was identified & brought in the operating room. The patient was positioned supine with arms tucked. SCDs were active during the entire case. The patient underwent general anesthesia without any difficulty.  The abdomen was prepped and draped in a sterile fashion. A Surgical Timeout confirmed our plan.  I made a transverse curvilinear incision through the superior umbilical fold.  I placed a 52mm long port through the supraumbilical fascia using a modified Hassan cutdown technique with umbilical stalk fascial countertraction. I began carbon dioxide insufflation.  No change in end tidal CO2 measurement.   Camera inspection revealed no injury. There were no adhesions to the anterior abdominal wall supraumbilically.  I proceeded to continue with single site technique. I placed a #5 port in left  upper aspect of the wound. I placed a 5 mm  atraumatic grasper in the right inferior aspect of the wound.  I turned attention to the right upper quadrant.  Patient had numerous adhesions between the gallbladder and anterior wall diaphragm consistent with Fitz-Hugh Curtis syndrome.  These adhesions were lysed off to allow mobility of the liver the right upper quadrant.  The gallbladder fundus was elevated cephalad. I freed adhesions to the ventral surface of the gallbladder off carefully.  Moderate adhesions of greater omentum and mesocolon to it.  I freed the peritoneal coverings between the gallbladder and the liver on the posteriolateral and anteriomedial walls. I alternated between Harmonic & blunt Maryland dissection to help get a good critical view of the cystic artery and cystic duct. I did further dissection to free a few centimeters of the gallbladder off the liver bed to get a good critical view of the infundibulum and cystic duct. I dissected out the cystic artery; and, after getting a good 360 view, ligated the anterior & posterior branches of the cystic artery close on the infundibulum using the Harmonic ultrasonic dissection.  I skeletonized the cystic duct.  I placed a clip on the infundibulum. I did a partial cystic duct-otomy and ensured patency.  Had to milk out a few stones out of the cystic duct.  He was narrow and fibrotic.  Initially would not pass.  I repeated the ductotomy a little more proximally.  With that I could more easily pass a 5 [place] cholangiocatheter through a puncture site at the right subcostal ridge of the abdominal wall and directed it into the cystic duct.  We ran a cholangiogram with dilute radio-opaque contrast and continuous fluoroscopy. Contrast flowed from a side branch consistent with cystic duct cannulization. Contrast flowed up the common hepatic duct into the right and left intrahepatic chains out to secondary radicals. Contrast flowed down the common bile duct easily across the normal ampulla into the  duodenum.  This was consistent with a normal cholangiogram.  I removed the cholangiocatheter. I placed clips on the cystic duct x4.  I completed cystic duct transection. I freed the gallbladder from its remaining attachments to the liver.  Because of her enlarged liver with fatty change, I did do core biopsies with a 14-gauge needle.  I did 4 passes and got 2-1/2 good course.  I ensured hemostasis on the gallbladder fossa of the liver and elsewhere. I inspected the rest of the abdomen & detected no injury nor bleeding elsewhere.  I removed the gallbladder out the supraumbilical fascia. I closed the fascia transversely using #1 PDS interrupted stitches. I closed the skin using 4-0 monocryl stitch.  Sterile dressing was applied. The patient was extubated & arrived in the PACU in stable condition..  I had discussed postoperative care with the patient in the holding area. I discussed operative findings, updated the patient's status, discussed probable steps to recovery, and gave postoperative recommendations to the patient's spouse.  Recommendations were made.  Questions were answered.  He expressed understanding & appreciation.  Adin Hector, M.D., F.A.C.S. Gastrointestinal and Minimally Invasive Surgery [facility] [address] [place], [place] [address] [phone] Main / Paging  01/23/2017 1:14 PM

## 2017-01-23 NOTE — Interval H&P Note (Signed)
History and Physical Interval Note:  01/23/2017 10:52 AM  Deanna Watson  has presented today for surgery, with the diagnosis of Chronic cholecystitis  The various methods of treatment have been discussed with the patient and family. After consideration of risks, benefits and other options for treatment, the patient has consented to  Procedure(s): [place] CHOLANGIOGRAM ERAS PATHWAY (N/A) POSSIBLE NEEDLE CORE BIOPSY OF LIVER (N/A) as a surgical intervention .  The patient's history has been reviewed, patient examined, no change in status, stable for surgery.  I have reviewed the patient's chart and labs.  Questions were answered to the patient's satisfaction.    I have re-reviewed the the patient's records, history, medications, and allergies.  I have re-examined the patient.  I again discussed intraoperative plans and goals of post-operative recovery.  The patient agrees to proceed.  Deanna Watson  30-Jan-1951 [id number]  Patient Care Team: Lance Sell, NP as PCP - General (Nurse Practitioner) Havery Moros, Carlota Raspberry, MD as Consulting Physician (Gastroenterology) Michael Boston, MD as Consulting Physician (General Surgery)  Patient Active Problem List   Diagnosis Date Noted  . Peripheral neuropathy 12/11/2016  . Tubular adenoma of colon 10/15/2016  . Medicare annual wellness visit, subsequent 04/04/2016  . Routine adult health maintenance 04/04/2016  . Type 2 diabetes mellitus without complication, without long-term current use of insulin ([place]) 03/01/2016  . Chest pain 03/01/2016  . Essential hypertension 03/01/2016  . Mild episode of recurrent major depressive disorder ([place]) 03/01/2016  . Mixed hyperlipidemia 03/01/2016    Past Medical History:  Diagnosis Date  . Allergy   . Anemia    history of  . Arthritis   . Asthma   . Bell's palsy    lasted only about 2 days  . Chicken pox   . Chronic bronchitis ([place])   . COPD  (chronic obstructive pulmonary disease) ([place])   . Depression   . Diabetes mellitus without complication ([place])   . Fatty liver    mild  . Fibromyalgia   . GERD (gastroesophageal reflux disease)   . Heart murmur   . History of blood transfusion   . Hyperlipidemia   . Hypertension   . Irregular heart beats   . Mild cardiomegaly   . Peripheral neuropathy   . PONV (postoperative nausea and vomiting)   . Pulmonary vascular congestion    mild  . Sciatica   . WPW (Wolff-Parkinson-White syndrome)     Past Surgical History:  Procedure Laterality Date  . CARDIAC ELECTROPHYSIOLOGY [place] AND ABLATION  2009  . CERVICAL [place] BIOPSY    . COLONOSCOPY    . REPLACEMENT TOTAL KNEE Left 2005  . UPPER GI ENDOSCOPY      Social History   Socioeconomic History  . Marital status: Married    Spouse name: Not on file  . Number of children: 1  . Years of education: 38  . Highest education level: Not on file  Social Needs  . Financial resource strain: Not on file  . Food insecurity - worry: Not on file  . Food insecurity - inability: Not on file  . Transportation needs - medical: Not on file  . Transportation needs - non-medical: Not on file  Occupational History  . Occupation: Retired  Tobacco Use  . Smoking status: Never Smoker  . Smokeless tobacco: Never Used  Substance and Sexual Activity  . Alcohol use: No  . Drug use: No  . Sexual activity: Not on file  Other Topics Concern  .  Not on file  Social History Narrative   Denies abuse and feels safe at home.     Family History  Problem Relation Age of Onset  . Arthritis Mother   . Heart disease Mother   . Stroke Mother   . Hypertension Mother   . Kidney disease Mother   . Diabetes Mother   . Alcohol abuse Father   . Epilepsy Father   . Heart disease Brother   . Ovarian cancer Paternal Aunt   . Stomach cancer Paternal Uncle   . Breast cancer Maternal Aunt   . Stomach cancer Maternal Uncle   . Colon cancer Neg Hx      Facility-Administered Medications Prior to Admission  Medication Dose Route Frequency Provider Last Rate Last Dose  . 0.9 %  sodium chloride infusion  500 mL Intravenous Continuous Armbruster, Carlota Raspberry, MD       Medications Prior to Admission  Medication Sig Dispense Refill Last Dose  . albuterol (PROAIR HFA) 108 (90 Base) MCG/ACT inhaler Inhale 1-2 puffs into the lungs every 6 (six) hours as needed for wheezing or shortness of breath. 1 Inhaler 0 01/22/2017 at Unknown time  . budesonide-formoterol (SYMBICORT) 160-4.5 MCG/ACT inhaler Inhale 1 puff into the lungs 2 (two) times daily.   01/23/2017 at Unknown time  . calcium carbonate (TUMS - DOSED IN MG ELEMENTAL CALCIUM) 500 MG chewable tablet Chew 1-2 tablets by mouth daily as needed for indigestion or heartburn.   Past Week at Unknown time  . celecoxib (CELEBREX) 200 MG capsule Take 1 capsule (200 mg total) by mouth daily. 30 capsule 2 01/22/2017 at Unknown time  . cetirizine (ZYRTEC) 10 MG tablet Take 10 mg by mouth daily as needed for allergies.   01/22/2017 at Unknown time  . Cholecalciferol (VITAMIN D3) 5000 units CAPS Take 5,000 Units by mouth daily.   Past Week at Unknown time  . escitalopram (LEXAPRO) 20 MG tablet Take 20 mg by mouth daily.   01/22/2017 at Unknown time  . gabapentin (NEURONTIN) 300 MG capsule Take 1 capsule (300 mg total) by mouth 3 (three) times daily. 270 capsule 0 01/22/2017 at Unknown time  . glipiZIDE (GLUCOTROL XL) 2.5 MG 24 hr tablet Take 2.5 mg by mouth daily.   01/22/2017 at Unknown time  . Ketotifen Fumarate (ITCHY EYE DROPS OP) Apply 1 drop to eye daily as needed (itching eyes).   Past Month at Unknown time  . lisinopril-hydrochlorothiazide (PRINZIDE,ZESTORETIC) 20-12.5 MG tablet Take 1 tablet by mouth daily.   01/22/2017 at Unknown time  . lovastatin (MEVACOR) 10 MG tablet Take 10 mg by mouth at bedtime.   01/22/2017 at Unknown time  . Menthol (ICY HOT) 5 % PTCH Apply 1 patch topically daily as needed (pain).    Past Week at Unknown time  . metFORMIN (GLUCOPHAGE) 500 MG tablet Take 2 tablets (1,000 mg total) by mouth 2 (two) times daily with a meal. (Patient taking differently: Take 500 mg by mouth 2 (two) times daily with a meal. ) 360 tablet 0 01/22/2017 at Unknown time  . Multiple Vitamin (MULTIVITAMIN) tablet Take 1 tablet by mouth daily.   01/22/2017 at Unknown time  . acetaminophen (TYLENOL) 500 MG tablet Take 1,000 mg by mouth 2 (two) times daily as needed for moderate pain or headache.   More than a month at Unknown time  . FLUoxetine (PROZAC) 20 MG tablet Take 1 tablet (20 mg total) by mouth daily. (Patient not taking: Reported on 12/16/2016) 30 tablet 3 Not  Taking at Unknown time  . omeprazole (PRILOSEC) 20 MG capsule Take 2 capsules (40 mg total) by mouth daily. (Patient not taking: Reported on 01/15/2017) 30 capsule 0 Not Taking at Unknown time    Current Facility-Administered Medications  Medication Dose Route Frequency Provider Last Rate Last Dose  . Chlorhexidine Gluconate Cloth 2 % PADS 6 each  6 each Topical Once Michael Boston, MD       And  . Chlorhexidine Gluconate Cloth 2 % PADS 6 each  6 each Topical Once Michael Boston, MD      . lactated ringers infusion   Intravenous Continuous Duane Boston, MD 50 mL/hr at 01/23/17 [phone]       No Known Allergies  BP (!) 182/83   Pulse 66   Temp (!) 97.5 F (36.4 C) (Oral)   Resp 16   Ht 5' (1.524 m)   Wt 77.6 kg (171 lb)   SpO2 100%   BMI 33.40 kg/m   Labs: Results for orders placed or performed during the hospital encounter of 01/23/17 (from the past 48 hour(s))  Glucose, capillary     Status: Abnormal   Collection Time: 01/23/17  9:21 AM  Result Value Ref Range   Glucose-Capillary 167 (H) 65 - 99 mg/dL    Imaging / Studies: No results found.   Adin Hector, M.D., F.A.C.S. Gastrointestinal and Minimally Invasive Surgery [facility] [address], [place], [place] [address] [phone]  Main / Paging  01/23/2017 10:52 AM    Adin Hector

## 2017-01-23 NOTE — Discharge Instructions (Signed)
LAPAROSCOPIC SURGERY: POST OP INSTRUCTIONS  ######################################################################  EAT Gradually transition to a high fiber diet with a fiber supplement over the next few weeks after discharge.  Start with a pureed / full liquid diet (see below)  WALK Walk an hour a day.  Control your pain to do that.    CONTROL PAIN Control pain so that you can walk, sleep, tolerate sneezing/coughing, go up/down stairs.  HAVE A BOWEL MOVEMENT DAILY Keep your bowels regular to avoid problems.  OK to try a laxative to override constipation.  OK to use an antidairrheal to slow down diarrhea.  Call if not better after 2 tries  CALL IF YOU HAVE PROBLEMS/CONCERNS Call if you are still struggling despite following these instructions. Call if you have concerns not answered by these instructions  ######################################################################    1. DIET: Follow a light bland diet the first 24 hours after arrival home, such as soup, liquids, crackers, etc.  Be sure to include lots of fluids daily.  Avoid fast food or heavy meals as your are more likely to get nauseated.  Eat a low fat the next few days after surgery.   2. Take your usually prescribed home medications unless otherwise directed. 3. PAIN CONTROL: a. Pain is best controlled by a usual combination of three different methods TOGETHER: i. Ice/Heat ii. Over the counter pain medication iii. Prescription pain medication b. Most patients will experience some swelling and bruising around the incisions.  Ice packs or heating pads (30-60 minutes up to 6 times a day) will help. Use ice for the first few days to help decrease swelling and bruising, then switch to heat to help relax tight/sore spots and speed recovery.  Some people prefer to use ice alone, heat alone, alternating between ice & heat.  Experiment to what works for you.  Swelling and bruising can take several weeks to resolve.   c. It is  helpful to take an over-the-counter pain medication regularly for the first few weeks.  Choose one of the following that works best for you: i. Naproxen (Aleve, etc)  Two 251m tabs twice a day ii. Ibuprofen (Advil, etc) Three 2053mtabs four times a day (every meal & bedtime) iii. Acetaminophen (Tylenol, etc) 500-65044mour times a day (every meal & bedtime) d. A  prescription for pain medication (such as oxycodone, hydrocodone, etc) should be given to you upon discharge.  Take your pain medication as prescribed.  i. If you are having problems/concerns with the prescription medicine (does not control pain, nausea, vomiting, rash, itching, etc), please call us [phone] see if we need to switch you to a different pain medicine that will work better for you and/or control your side effect better. ii. If you need a refill on your pain medication, please contact your pharmacy.  They will contact our office to request authorization. Prescriptions will not be filled after 5 pm or on week-ends. 4. Avoid getting constipated.  Between the surgery and the pain medications, it is common to experience some constipation.  Increasing fluid intake and taking a fiber supplement (such as Metamucil, Citrucel, FiberCon, MiraLax, etc) 1-2 times a day regularly will usually help prevent this problem from occurring.  A mild laxative (prune juice, Milk of Magnesia, MiraLax, etc) should be taken according to package directions if there are no bowel movements after 48 hours.   5. Watch out for diarrhea.  If you have many loose bowel movements, simplify your diet to bland foods & liquids for  a few days.  Stop any stool softeners and decrease your fiber supplement.  Switching to mild anti-diarrheal medications (Kayopectate, Pepto Bismol) can help.  If this worsens or does not improve, please call us. 6. Wash / shower every day.  You may shower over the dressings as they are waterproof.  Continue to shower over incision(s)  after the dressing is off. 7. Remove your waterproof bandages 5 days after surgery.  You may leave the incision open to air.  You may replace a dressing/Band-Aid to cover the incision for comfort if you wish.  8. ACTIVITIES as tolerated:   a. You may resume regular (light) daily activities beginning the next day--such as daily self-care, walking, climbing stairs--gradually increasing activities as tolerated.  If you can walk 30 minutes without difficulty, it is safe to try more intense activity such as jogging, treadmill, bicycling, low-impact aerobics, swimming, etc. b. Save the most intensive and strenuous activity for last such as sit-ups, heavy lifting, contact sports, etc  Refrain from any heavy lifting or straining until you are off narcotics for pain control.   c. DO NOT PUSH THROUGH PAIN.  Let pain be your guide: If it hurts to do something, don't do it.  Pain is your body warning you to avoid that activity for another week until the pain goes down. d. You may drive when you are no longer taking prescription pain medication, you can comfortably wear a seatbelt, and you can safely maneuver your car and apply brakes. e. Dennis Bast may have sexual intercourse when it is comfortable.  9. FOLLOW UP in our office a. Please call [facility] at [phone] to set up an appointment to see your surgeon in the office for a follow-up appointment approximately 2-3 weeks after your surgery. b. Make sure that you call for this appointment the day you arrive home to insure a convenient appointment time. 10. IF YOU HAVE DISABILITY OR FAMILY LEAVE FORMS, BRING THEM TO THE OFFICE FOR PROCESSING.  DO NOT GIVE THEM TO YOUR DOCTOR.   WHEN TO CALL us [phone]: 1. Poor pain control 2. Reactions / problems with new medications (rash/itching, nausea, etc)  3. Fever over 101.5 F (38.5 C) 4. Inability to urinate 5. Nausea and/or vomiting 6. Worsening swelling or bruising 7. Continued bleeding from incision. 8. Increased  pain, redness, or drainage from the incision   The clinic staff is available to answer your questions during regular business hours (8:30am-5pm).  Please dont hesitate to call and ask to speak to one of our nurses for clinical concerns.   If you have a medical emergency, go to the nearest emergency room or call 911.  A surgeon from [facility] is always on call at the [facility], [place], [place], [place], [place]  [address] ? MAIN: [phone] ? TOLL FREE: [phone] ?  FAX [phone] [email or web address]   Cholecystitis Cholecystitis is inflammation of the gallbladder. It is often called a gallbladder attack. The gallbladder is a pear-shaped organ that lies beneath the liver on the right side of the body. The gallbladder stores bile, which is a fluid that helps the body to digest fats. If bile builds up in your gallbladder, your gallbladder becomes inflamed. This condition may occur suddenly (be acute). Repeat episodes of acute cholecystitis or prolonged episodes may lead to a long-term (chronic) condition. Cholecystitis is serious and it requires treatment. What are the causes? The most common cause of this condition  is gallstones. Gallstones can block the tube (duct) that carries bile out of your gallbladder. This causes bile to build up. Other causes of this condition include:  Damage to the gallbladder due to a decrease in blood flow.  Infections in the bile ducts.  Scars or kinks in the bile ducts.  Tumors in the liver, pancreas, or gallbladder.  What increases the risk? This condition is more likely to develop in:  People who have sickle cell disease.  People who take birth control pills or use estrogen.  People who have alcoholic liver disease.  People who have liver cirrhosis.  People who have their nutrition delivered through a vein (parenteral nutrition).  People who do not eat or drink  (do fasting) for a long period of time.  People who are obese.  People who have rapid weight loss.  People who are pregnant.  People who have increased triglyceride levels.  People who have pancreatitis.  What are the signs or symptoms? Symptoms of this condition include:  Abdominal pain, especially in the upper right area of the abdomen.  Abdominal tenderness or bloating.  Nausea.  Vomiting.  Fever.  Chills.  Yellowing of the skin and the whites of the eyes (jaundice).  How is this diagnosed? This condition is diagnosed with a medical history and physical exam. You may also have other tests, including:  Imaging tests, such as: ? An ultrasound of the gallbladder. ? A CT scan of the abdomen. ? A gallbladder nuclear scan (HIDA scan). This scan allows your health care provider to see the bile moving from your liver to your gallbladder and to your small intestine. ? MRI.  Blood tests, such as: ? A complete blood count, because the white blood cell count may be higher than normal. ? Liver function tests, because some levels may be higher than normal with certain types of gallstones.  How is this treated? Treatment may include:  Fasting for a certain amount of time.  IV fluids.  Medicine to treat pain or vomiting.  Antibiotic medicine.  Surgery to remove your gallbladder (cholecystectomy). This may happen immediately or at a later time.  Follow these instructions at home: Home care will depend on your treatment. In general:  Take over-the-counter and prescription medicines only as told by your health care provider.  If you were prescribed an antibiotic medicine, take it as told by your health care provider. Do not stop taking the antibiotic even if you start to feel better.  Follow instructions from your health care provider about what to eat or drink. When you are allowed to eat, avoid eating or drinking anything that triggers your symptoms.  Keep all  follow-up visits as told by your health care provider. This is important.  Contact a health care provider if:  Your pain is not controlled with medicine.  You have a fever. Get help right away if:  Your pain moves to another part of your abdomen or to your back.  You continue to have symptoms or you develop new symptoms even with treatment. This information is not intended to replace advice given to you by your health care provider. Make sure you discuss any questions you have with your health care provider. Document Released: 02/04/2005 Document Revised: 06/15/2015 Document Reviewed: 05/18/2014 Elsevier Interactive Patient Education  2017 [place] Anesthesia, Adult, Care After These instructions provide you with information about caring for yourself after your procedure. Your health care provider may also give you more  specific instructions. Your treatment has been planned according to current medical practices, but problems sometimes occur. Call your health care provider if you have any problems or questions after your procedure. What can I expect after the procedure? After the procedure, it is common to have:  Vomiting.  A sore throat.  Mental slowness.  It is common to feel:  Nauseous.  Cold or shivery.  Sleepy.  Tired.  Sore or achy, even in parts of your body where you did not have surgery.  Follow these instructions at home: For at least 24 hours after the procedure:  Do not: ? Participate in activities where you could fall or become injured. ? Drive. ? Use heavy machinery. ? Drink alcohol. ? Take sleeping pills or medicines that cause drowsiness. ? Make important decisions or sign legal documents. ? Take care of children on your own.  Rest. Eating and drinking  If you vomit, drink water, juice, or soup when you can drink without vomiting.  Drink enough fluid to keep your urine clear or pale yellow.  Make sure you have little or no  nausea before eating solid foods.  Follow the diet recommended by your health care provider. General instructions  Have a responsible adult stay with you until you are awake and alert.  Return to your normal activities as told by your health care provider. Ask your health care provider what activities are safe for you.  Take over-the-counter and prescription medicines only as told by your health care provider.  If you smoke, do not smoke without supervision.  Keep all follow-up visits as told by your health care provider. This is important. Contact a health care provider if:  You continue to have nausea or vomiting at home, and medicines are not helpful.  You cannot drink fluids or start eating again.  You cannot urinate after 8-12 hours.  You develop a skin rash.  You have fever.  You have increasing redness at the site of your procedure. Get help right away if:  You have difficulty breathing.  You have chest pain.  You have unexpected bleeding.  You feel that you are having a life-threatening or urgent problem. This information is not intended to replace advice given to you by your health care provider. Make sure you discuss any questions you have with your health care provider. Document Released: 05/13/2000 Document Revised: 07/10/2015 Document Reviewed: 01/19/2015 Elsevier Interactive Patient Education  [employer].

## 2017-01-23 NOTE — H&P (Signed)
Deanna Watson  Location: [facility] Patient #: [id number] DOB: 08-Jul-1950 Married / Language: Cleophus Molt / Race: Refused to Report/Unreported Female  Patient Care Team: Lance Sell, NP as PCP - General (Nurse Practitioner) Havery Moros, Carlota Raspberry, MD as Consulting Physician (Gastroenterology) Michael Boston, MD as Consulting Physician (General Surgery)   Chief Complaint: Persistent upper abdominal pain and nausea. Suspicion of chronic cholecystitis  The patient is a woman that I saw on the summertime with upper abdominal complaints. [facility] emergency room severe pain. Cardiac workup negative. Gallstones incidentally noted. Her symptoms were hard to tease out. It sounded more gastric in nature. I recommended gastroenterology evaluation and studies. She had a gastric emptying study which was normal. She had no improvement on omeprazole. Occasionally feels like Tums helps a little. Had an EGD. No major hiatal hernia. No esophagitis or gastritis. No retained food. Underwhelming. Surgical consultation requested.  Patient notes that she gets attacks of upper abdominal pain. It has happened when she eats out. Usually starts 20 minutes with pain and nausea. She's had episodes where she feels like she is almost going to vomit. Sometimes it happens without eating well. Pain starts epigastrically. Occasionally radiating to the right. Now more on the left. Occasionally it will just with drinking liquids. Sort of her chronic achiness now. Not necessarily related to activity. She does struggle with her fibromyalgia and other chronic pain issues.  (Review of systems as stated in this history (HPI) or in the review of systems. Otherwise all other 12 point ROS are negative)   Allergies Malachy Moan, RMA; 01/06/2017 11:33 AM) No Known Drug Allergies 10/15/2016  Medication History Malachy Moan, RMA; 01/06/2017 11:34 AM) ProAir HFA (108 (90 Base)MCG/ACT  Aerosol Soln, Inhalation) Active. Budesonide-Formoterol Fumarate (160-4.5MCG/ACT Aerosol, Inhalation) Active. Escitalopram Oxalate (20MG  Tablet, Oral) Active. Lovastatin (10MG  Tablet, Oral) Active. Gabapentin (300MG  Capsule, Oral) Active. MetFORMIN HCl (500MG  Tablet, Oral) Active. Lisinopril-Hydrochlorothiazide (20-12.5MG  Tablet, Oral) Active. Celecoxib (200MG  Capsule, Oral) Active. Multivitamin Adult (Oral) Active. Vitamin D (50000U Tablet, Oral) Active. Medications Reconciled  Vitals Malachy Moan RMA; 01/06/2017 11:34 AM) 01/06/2017 11:34 AM Weight: 170 lb Height: 60in Body Surface Area: 1.74 m Body Mass Index: 33.2 kg/m  Temp.: 89F  Pulse: 70 (Regular)  BP: 138/80 (Sitting, Left Arm, Standard)       Physical Exam Adin Hector MD; 01/06/2017 12:02 PM) General Mental Status-Alert. General Appearance-Not in acute distress, Not Sickly. Orientation-Oriented X3. Hydration-Well hydrated. Voice-Normal. Note: Not toxic. Not sickly.   Integumentary Global Assessment Upon inspection and palpation of skin surfaces of the - Axillae: non-tender, no inflammation or ulceration, no drainage. and Distribution of scalp and body hair is normal. General Characteristics Temperature - normal warmth is noted.  Head and Neck Head-normocephalic, atraumatic with no lesions or palpable masses. Face Global Assessment - atraumatic, no absence of expression. Neck Global Assessment - no abnormal movements, no bruit auscultated on the right, no bruit auscultated on the left, no decreased range of motion, non-tender. Trachea-midline. Thyroid Gland Characteristics - non-tender.  Eye Eyeball - Left-Extraocular movements intact, No Nystagmus. Eyeball - Right-Extraocular movements intact, No Nystagmus. Cornea - Left-No Hazy. Cornea - Right-No Hazy. Sclera/Conjunctiva - Left-No scleral icterus, No Discharge. Sclera/Conjunctiva -  Right-No scleral icterus, No Discharge. Pupil - Left-Direct reaction to light normal. Pupil - Right-Direct reaction to light normal.  ENMT Ears Pinna - Left - no drainage observed, no generalized tenderness observed. Right - no drainage observed, no generalized tenderness observed. Nose and Sinuses External Inspection of the Nose - no  destructive lesion observed. Inspection of the nares - Left - quiet respiration. Right - quiet respiration. Mouth and Throat Lips - Upper Lip - no fissures observed, no pallor noted. Lower Lip - no fissures observed, no pallor noted. Nasopharynx - no discharge present. Oral Cavity/Oropharynx - Tongue - no dryness observed. Oral Mucosa - no cyanosis observed. Hypopharynx - no evidence of airway distress observed.  Chest and Lung Exam Inspection Movements - Normal and Symmetrical. Accessory muscles - No use of accessory muscles in breathing. Palpation Palpation of the chest reveals - Non-tender. Auscultation Breath sounds - Normal and Clear.  Cardiovascular Auscultation Rhythm - Regular. Murmurs & Other Heart Sounds - Auscultation of the heart reveals - No Murmurs and No Systolic Clicks.  Abdomen Inspection Inspection of the abdomen reveals - No Visible peristalsis and No Abnormal pulsations. Umbilicus - No Bleeding, No Urine drainage. Palpation/Percussion Palpation and Percussion of the abdomen reveal - Soft, Non Tender, No Rebound tenderness, No Rigidity (guarding) and No Cutaneous hyperesthesia. Note: Mild epigastric discomfort. Some left and right-sided upper abdominal discomfort. Abdomen obese but soft. Not distended. No distasis recti. No umbilical or other anterior abdominal wall hernias   Female Genitourinary Sexual Maturity Tanner 5 - Adult hair pattern. Note: No vaginal bleeding nor discharge   Peripheral Vascular Upper Extremity Inspection - Left - No Cyanotic nailbeds, Not Ischemic. Right - No Cyanotic nailbeds, Not  Ischemic.  Neurologic Neurologic evaluation reveals -normal attention span and ability to concentrate, able to name objects and repeat phrases. Appropriate fund of knowledge , normal sensation and normal coordination. Mental Status Affect - not angry, not paranoid. Cranial Nerves-Normal Bilaterally. Gait-Normal.  Neuropsychiatric Mental status exam performed with findings of-able to articulate well with normal speech/language, rate, volume and coherence, thought content normal with ability to perform basic computations and apply abstract reasoning and no evidence of hallucinations, delusions, obsessions or homicidal/suicidal ideation.  Musculoskeletal Global Assessment Spine, Ribs and Pelvis - no instability, subluxation or laxity. Right Upper Extremity - no instability, subluxation or laxity.  Lymphatic Head & Neck  General Head & Neck Lymphatics: Bilateral - Description - No Localized lymphadenopathy. Axillary  General Axillary Region: Bilateral - Description - No Localized lymphadenopathy. Femoral & Inguinal  Generalized Femoral & Inguinal Lymphatics: Left - Description - No Localized lymphadenopathy. Right - Description - No Localized lymphadenopathy.    Assessment & Plan  CHRONIC CHOLECYSTITIS WITH CALCULUS (K80.10) Impression: Postprandial upper abdominal pain with bloating and nausea. I think the story is a little more convincing today. His been a challenge to get a consistent history out of her. She can get a little tangential and talk about everything that is going wrong with her.. Eventually was able to focus her down and find a story of gallbladder attacks, especially when eating out. The fact that her gastric emptying is normal and her endoscopy was underwhelming and her cardiac workup was underwhelming makes me feel more confident that cholecystectomy will provide some relief for her.  With her fibromyalgia, constipation and other issues, I am guarded against  solving all her abdominal issues with just a lap cholecystectomy; but, now that she has been thinking about it and trying to tease out the symptoms, there is definitely a postprandial trigger. Reasonable to do outpatient single site procedure. Low threshold to do core liver biopsy is concerning as well. She is motivated to consider surgery in the hopes that it can help her get to a better place. She is obviously miserable with these attacks.  Glucose elevated -  needs PCP DM f/u  Pt ready for surgery   Current Plans You are being scheduled for surgery- Our schedulers will call you.  You should hear from our office's scheduling department within 5 working days about the location, date, and time of surgery. We try to make accommodations for patient's preferences in scheduling surgery, but sometimes the OR schedule or the surgeon's schedule prevents [place] from making those accommodations.  If you have not heard from our office [phone]) in 5 working days, call the office and ask for your surgeon's nurse.  If you have other questions about your diagnosis, plan, or surgery, call the office and ask for your surgeon's nurse.  Written instructions provided Pt Education - Pamphlet Given - Laparoscopic Gallbladder Surgery: discussed with patient and provided information. The anatomy & physiology of hepatobiliary & pancreatic function was discussed. The pathophysiology of gallbladder dysfunction was discussed. Natural history risks without surgery was discussed. I feel the risks of no intervention will lead to serious problems that outweigh the operative risks; therefore, I recommended cholecystectomy to remove the pathology. I explained laparoscopic techniques with possible need for an open approach. Probable cholangiogram to evaluate the bilary tract was explained as well.  Risks such as bleeding, infection, abscess, leak, injury to other organs, need for further treatment, heart attack, death,  and other risks were discussed. I noted a good likelihood this will help address the problem. Possibility that this will not correct all abdominal symptoms was explained. Goals of post-operative recovery were discussed as well. We will work to minimize complications. An educational handout further explaining the pathology and treatment options was given as well. Questions were answered. The patient expresses understanding & wishes to proceed with surgery.  Pt Education - [facility] Laparosopic Post Op HCI (Arzu Mcgaughey) Pt Education - [facility] Good Bowel Health (Jairen Goldfarb) Pt Education - Laparoscopic Cholecystectomy: gallbladder CHRONIC CONSTIPATION (K59.09) Impression: Would benefit from fiber bowel regimen.  Colonoscopy not showing any major issues  Adin Hector, M.D., F.A.C.S. Gastrointestinal and Minimally Invasive Surgery [facility] [address] [place], [place] [address] [phone] Main / Paging

## 2017-02-13 NOTE — Addendum Note (Signed)
Addendum  created 02/13/17 0931 by Rica Koyanagi, MD   Intraprocedure Staff edited

## 2017-03-03 ENCOUNTER — Other Ambulatory Visit: Payer: Self-pay | Admitting: Family

## 2017-03-05 ENCOUNTER — Telehealth: Payer: Self-pay | Admitting: Nurse Practitioner

## 2017-03-05 MED ORDER — ESCITALOPRAM OXALATE 20 MG PO TABS: 20.0000 mg | ORAL_TABLET | Freq: Every day | ORAL | 0 refills | Status: DC

## 2017-03-05 NOTE — Telephone Encounter (Signed)
Mrs. Kain phoned to get a message to A. Gayla Medicus, NP.  She never started the Prozac that was prescribed to her because she began to feel better after her last OV so she continued to take the Lexapro and would like a refill on it, if possible. She has a January appointment scheduled with Ashleigh.  Please advise.

## 2017-03-05 NOTE — Telephone Encounter (Signed)
LVM letting pt know that a 30 day supply of med was sent to pharmacy until she is able to see Ashleigh and discuss medication further. Asked her to call back with any questions or concerns if needed.

## 2017-03-05 NOTE — Telephone Encounter (Signed)
Copied from [place] [phone]. Topic: Quick Communication - See Telephone Encounter >> Mar 05, 2017 10:41 AM Bea Graff, NT wrote: CRM for notification. See Telephone encounter for: Pt tried to get her Lexapro refilled but was pharmacy stated this was denied. Pt is wanting to see if she can get a refill to last long enough to her appt on 03/13/17. Uses [facility] on [address]  03/05/17.

## 2017-03-13 ENCOUNTER — Ambulatory Visit (INDEPENDENT_AMBULATORY_CARE_PROVIDER_SITE_OTHER): Payer: Medicare HMO | Admitting: Nurse Practitioner

## 2017-03-13 ENCOUNTER — Encounter: Payer: Self-pay | Admitting: Nurse Practitioner

## 2017-03-13 VITALS — BP 140/68 | HR 74 | Temp 98.4°F | Resp 16 | Ht 60.0 in | Wt 174.0 lb

## 2017-03-13 DIAGNOSIS — G589 Mononeuropathy, unspecified: Secondary | ICD-10-CM

## 2017-03-13 DIAGNOSIS — E782 Mixed hyperlipidemia: Secondary | ICD-10-CM | POA: Diagnosis not present

## 2017-03-13 DIAGNOSIS — E119 Type 2 diabetes mellitus without complications: Secondary | ICD-10-CM

## 2017-03-13 DIAGNOSIS — E1141 Type 2 diabetes mellitus with diabetic mononeuropathy: Secondary | ICD-10-CM | POA: Diagnosis not present

## 2017-03-13 DIAGNOSIS — F330 Major depressive disorder, recurrent, mild: Secondary | ICD-10-CM | POA: Diagnosis not present

## 2017-03-13 MED ORDER — METFORMIN HCL 500 MG PO TABS: 500.0000 mg | ORAL_TABLET | Freq: Two times a day (BID) | ORAL | 7 refills | Status: DC

## 2017-03-13 MED ORDER — CELECOXIB 200 MG PO CAPS: 200.0000 mg | ORAL_CAPSULE | Freq: Every day | ORAL | 2 refills | Status: DC

## 2017-03-13 MED ORDER — GLUCOSE BLOOD VI STRP: ORAL_STRIP | 12 refills | Status: AC

## 2017-03-13 MED ORDER — GABAPENTIN 300 MG PO CAPS: 300.0000 mg | ORAL_CAPSULE | Freq: Three times a day (TID) | ORAL | 0 refills | Status: DC

## 2017-03-13 MED ORDER — ESCITALOPRAM OXALATE 20 MG PO TABS: 20.0000 mg | ORAL_TABLET | Freq: Every day | ORAL | 2 refills | Status: DC

## 2017-03-13 MED ORDER — LOVASTATIN 10 MG PO TABS: 10.0000 mg | ORAL_TABLET | Freq: Every day | ORAL | 2 refills | Status: DC

## 2017-03-13 NOTE — Patient Instructions (Addendum)
Please return to the lab downstairs for labwork when fasting- only water or black coffee for 8 hours prior  I will be in touch with her repeat A1c and we will decide what to do with your diabetes medications and when you need to come back.  I have placed a referral to psychology for counseling. Our office will call you to schedule this appointment. You should hear from our office in 7-10 days.  It was good to see you. Thanks for letting me take care of you today :)  Mindfulness-Based Stress Reduction Mindfulness-based stress reduction (MBSR) is a program that helps people learn to practice mindfulness. Mindfulness is the practice of intentionally paying attention to the present moment. It can be learned and practiced through techniques such as education, breathing exercises, meditation, and yoga. MBSR includes several mindfulness techniques in one program. MBSR works best when you understand the treatment, are willing to try new things, and can commit to spending time practicing what you learn. MBSR training may include learning about:  How your emotions, thoughts, and reactions affect your body.  New ways to respond to things that cause negative thoughts to start (triggers).  How to notice your thoughts and let go of them.  Practicing awareness of everyday things that you normally do without thinking.  The techniques and goals of different types of meditation.  What are the benefits of MBSR? MBSR can have many benefits, which include helping you to:  Develop self-awareness. This refers to knowing and understanding yourself.  Learn skills and attitudes that help you to participate in your own health care.  Learn new ways to care for yourself.  Be more accepting about how things are, and let things go.  Be less judgmental and approach things with an open mind.  Be patient with yourself and trust yourself more.  MBSR has also been shown to:  Reduce negative emotions, such as  depression and anxiety.  Improve memory and focus.  Change how you sense and approach pain.  Boost your body's ability to fight infections.  Help you connect better with other people.  Improve your sense of well-being.  Follow these instructions at home:  Find a local in-person or online MBSR program.  Set aside some time regularly for mindfulness practice.  Find a mindfulness practice that works best for you. This may include one or more of the following: ? Meditation. Meditation involves focusing your mind on a certain thought or activity. ? Breathing awareness exercises. These help you to stay present by focusing on your breath. ? Body scan. For this practice, you lie down and pay attention to each part of your body from head to toe. You can identify tension and soreness and intentionally relax parts of your body. ? Yoga. Yoga involves stretching and breathing, and it can improve your ability to move and be flexible. It can also provide an experience of testing your body's limits, which can help you release stress. ? Mindful eating. This way of eating involves focusing on the taste, texture, color, and smell of each bite of food. Because this slows down eating and helps you feel full sooner, it can be an important part of a weight-loss plan.  Find a podcast or recording that provides guidance for breathing awareness, body scan, or meditation exercises. You can listen to these any time when you have a free moment to rest without distractions.  Follow your treatment plan as told by your health care provider. This may include taking  regular medicines and making changes to your diet or lifestyle as recommended. How to practice mindfulness To do a basic awareness exercise:  Find a comfortable place to sit.  Pay attention to the present moment. Observe your thoughts, feelings, and surroundings just as they are.  Avoid placing judgment on yourself, your feelings, or your surroundings.  Make note of any judgment that comes up, and let it go.  Your mind may wander, and that is okay. Make note of when your thoughts drift, and return your attention to the present moment.  To do basic mindfulness meditation:  Find a comfortable place to sit. This may include a stable chair or a firm floor cushion. ? Sit upright with your back straight. Let your arms fall next to your side with your hands resting on your legs. ? If sitting in a chair, rest your feet flat on the floor. ? If sitting on a cushion, cross your legs in front of you.  Keep your head in a neutral position with your chin dropped slightly. Relax your jaw and rest the tip of your tongue on the roof of your mouth. Drop your gaze to the floor. You can close your eyes if you like.  Breathe normally and pay attention to your breath. Feel the air moving in and out of your nose. Feel your belly expanding and relaxing with each breath.  Your mind may wander, and that is okay. Make note of when your thoughts drift, and return your attention to your breath.  Avoid placing judgment on yourself, your feelings, or your surroundings. Make note of any judgment or feelings that come up, let them go, and bring your attention back to your breath.  When you are ready, lift your gaze or open your eyes. Pay attention to how your body feels after the meditation.  Where to find more information: You can find more information about MBSR from:  Your health care provider.  Community-based meditation centers or programs.  Programs offered near you.  Summary  Mindfulness-based stress reduction (MBSR) is a program that teaches you how to intentionally pay attention to the present moment. It is used with other treatments to help you cope better with daily stress, emotions, and pain.  MBSR focuses on developing self-awareness, which allows you to respond to life stress without judgment or negative emotions.  MBSR programs may involve  learning different mindfulness practices, such as breathing exercises, meditation, yoga, body scan, or mindful eating. Find a mindfulness practice that works best for you, and set aside time for it on a regular basis. This information is not intended to replace advice given to you by your health care provider. Make sure you discuss any questions you have with your health care provider. Document Released: 06/13/2016 Document Revised: 06/13/2016 Document Reviewed: 06/13/2016 Elsevier Interactive Patient Education  [employer].

## 2017-03-13 NOTE — Progress Notes (Signed)
Name: Deanna Watson   MRN: [id number]    DOB: 1950/07/19   Date:03/19/2017       Progress Note  Subjective  Chief Complaint  HPI Deanna Watson presents today for a follow up of her diabetes and depression. she's also requesting a refill of her cholesterol medication.  Depression- On her last visit she was switched from lexapro to prozac due to headaches, which she felt were related to her lexapro. She says she never tried the prozac because she was afraid of trying a new medication  She says the headaches have stopped and she decided to continue lexapro. She feels the lexapro does help her mood, but she does have constant stress related to being the primary caregiver for her husband. She is willing to try counseling today. She denies thoughts of hurting herself or others.  Diabetes-  At her last appointment we stoped glizide due to hypoglycemic episodes, and increased her metfomin to 1000 BID. She was unable to tolerate the high dose of metformin due to GI upset so she decreased her metformin back to to 500 BID and re-started the glipizde. She denies any further episodes of hypoglycemia today. Reports home glucose readings of 170-200 over past weeks. Denies tremor, diaphoresis, polyuria, polydipsia, polyphagia. Some numbness and tinging in feet, she denies any open sores or wounds to her feet.  Lab Results  Component Value Date   HGBA1C 6.5 12/11/2016   Cholesterol- maintained on lovastain 10 daily. Reports daily medication compliance without adverse medication effects including myalgias.  Lab Results  Component Value Date   CHOL 190 04/04/2016   HDL 73.20 04/04/2016   LDLCALC 99 04/04/2016   TRIG 92.0 04/04/2016   CHOLHDL 3 04/04/2016     Patient Active Problem List   Diagnosis Date Noted  . Chronic cholecystitis with calculus 01/23/2017  . Peripheral neuropathy 12/11/2016  . Tubular adenoma of colon 10/15/2016  . Medicare annual wellness visit, subsequent 04/04/2016   . Routine adult health maintenance 04/04/2016  . Type 2 diabetes mellitus without complication, without long-term current use of insulin ([place]) 03/01/2016  . Chest pain 03/01/2016  . Essential hypertension 03/01/2016  . Mild episode of recurrent major depressive disorder ([place]) 03/01/2016  . Mixed hyperlipidemia 03/01/2016    Past Surgical History:  Procedure Laterality Date  . CARDIAC ELECTROPHYSIOLOGY [place] AND ABLATION  2009  . CERVICAL [place] BIOPSY    . COLONOSCOPY    . LAPAROSCOPIC CHOLECYSTECTOMY SINGLE SITE WITH INTRAOPERATIVE CHOLANGIOGRAM N/A 01/23/2017   Procedure: LAPAROSCOPIC CHOLECYSTECTOMY SINGLE SITE WITH INTRAOPERATIVE CHOLANGIOGRAM ERAS PATHWAY;  Surgeon: Michael Boston, MD;  Location: [facility];  Service: General;  Laterality: N/A;  . LIVER BIOPSY N/A 01/23/2017   Procedure: BIOPSY OF LIVER;  Surgeon: Michael Boston, MD;  Location: [facility];  Service: General;  Laterality: N/A;  . REPLACEMENT TOTAL KNEE Left 2005  . UPPER GI ENDOSCOPY      Family History  Problem Relation Age of Onset  . Arthritis Mother   . Heart disease Mother   . Stroke Mother   . Hypertension Mother   . Kidney disease Mother   . Diabetes Mother   . Alcohol abuse Father   . Epilepsy Father   . Heart disease Brother   . Ovarian cancer Paternal Aunt   . Stomach cancer Paternal Uncle   . Breast cancer Maternal Aunt   . Stomach cancer Maternal Uncle   . Colon cancer Neg Hx     Social History   Socioeconomic History  . Marital  status: Married    Spouse name: Not on file  . Number of children: 1  . Years of education: 49  . Highest education level: Not on file  Social Needs  . Financial resource strain: Not on file  . Food insecurity - worry: Not on file  . Food insecurity - inability: Not on file  . Transportation needs - medical: Not on file  . Transportation needs - non-medical: Not on file  Occupational History  . Occupation: Retired  Tobacco Use  . Smoking status: Never Smoker  .  Smokeless tobacco: Never Used  Substance and Sexual Activity  . Alcohol use: No  . Drug use: No  . Sexual activity: Not on file  Other Topics Concern  . Not on file  Social History Narrative   Denies abuse and feels safe at home.      Current Outpatient Medications:  .  acetaminophen (TYLENOL) 500 MG tablet, Take 1,000 mg by mouth 2 (two) times daily as needed for moderate pain or headache., Disp: , Rfl:  .  albuterol (PROAIR HFA) 108 (90 Base) MCG/ACT inhaler, Inhale 1-2 puffs into the lungs every 6 (six) hours as needed for wheezing or shortness of breath., Disp: 1 Inhaler, Rfl: 0 .  budesonide-formoterol (SYMBICORT) 160-4.5 MCG/ACT inhaler, Inhale 1 puff into the lungs 2 (two) times daily., Disp: , Rfl:  .  calcium carbonate (TUMS - DOSED IN MG ELEMENTAL CALCIUM) 500 MG chewable tablet, Chew 1-2 tablets by mouth daily as needed for indigestion or heartburn., Disp: , Rfl:  .  celecoxib (CELEBREX) 200 MG capsule, Take 1 capsule (200 mg total) by mouth daily., Disp: 30 capsule, Rfl: 2 .  cetirizine (ZYRTEC) 10 MG tablet, Take 10 mg by mouth daily as needed for allergies., Disp: , Rfl:  .  Cholecalciferol (VITAMIN D3) 5000 units CAPS, Take 5,000 Units by mouth daily., Disp: , Rfl:  .  escitalopram (LEXAPRO) 20 MG tablet, Take 1 tablet (20 mg total) by mouth daily., Disp: 90 tablet, Rfl: 2 .  gabapentin (NEURONTIN) 300 MG capsule, Take 1 capsule (300 mg total) by mouth 3 (three) times daily., Disp: 270 capsule, Rfl: 0 .  glipiZIDE (GLUCOTROL XL) 2.5 MG 24 hr tablet, Take 2.5 mg by mouth daily., Disp: , Rfl:  .  Ketotifen Fumarate (ITCHY EYE DROPS OP), Apply 1 drop to eye daily as needed (itching eyes)., Disp: , Rfl:  .  lisinopril-hydrochlorothiazide (PRINZIDE,ZESTORETIC) 20-12.5 MG tablet, Take 1 tablet by mouth daily., Disp: , Rfl:  .  lovastatin (MEVACOR) 10 MG tablet, Take 1 tablet (10 mg total) by mouth at bedtime., Disp: 90 tablet, Rfl: 2 .  Menthol (ICY HOT) 5 % PTCH, Apply 1 patch  topically daily as needed (pain)., Disp: , Rfl:  .  metFORMIN (GLUCOPHAGE) 500 MG tablet, Take 1 tablet (500 mg total) by mouth 2 (two) times daily with a meal., Disp: 180 tablet, Rfl: 7 .  Multiple Vitamin (MULTIVITAMIN) tablet, Take 1 tablet by mouth daily., Disp: , Rfl:  .  omeprazole (PRILOSEC) 20 MG capsule, Take 2 capsules (40 mg total) by mouth daily., Disp: 30 capsule, Rfl: 0 .  traMADol (ULTRAM) 50 MG tablet, Take 1-2 tablets (50-100 mg total) by mouth every 6 (six) hours as needed for moderate pain or severe pain., Disp: 30 tablet, Rfl: 0 .  glucose blood test strip, Use to check blood sugars 2 times daily. Dx Code- E11.41, Disp: 100 each, Rfl: 12  Current Facility-Administered Medications:  .  0.9 %  sodium chloride infusion, 500 mL, Intravenous, Continuous, Armbruster, Carlota Raspberry, MD  No Known Allergies   ROS See HPI  Objective  Vitals:   03/13/17 1302  BP: 140/68  Pulse: 74  Resp: 16  Temp: 98.4 F (36.9 C)  TempSrc: Oral  SpO2: 96%  Weight: 174 lb (78.9 kg)  Height: 5' (1.524 m)   Body mass index is 33.98 kg/m.  Physical Exam Vtial signs reviewed. Constitutional: She is oriented to person, place, and time. She appears well-developed and well-nourished. obese  HENT: Head: Normocephalic and atraumatic.  Nose: Nose normal. Eyes: Conjunctivae are normal. No scleral icterus.  Neck: Normal range of motion. Neck supple. No JVD present. Cardiovascular: Normal rate, regular rhythm and intact distal pulses.   Pulmonary/Chest: Effort normal and breath sounds normal.  Musculoskeletal: Normal range of motion, no joint effusions. No gross deformities Neurological: She is alert and oriented to person, place, and time. She has normal strength.  Coordination, balance, strength, speech and gait are normal. DTRs intact. Skin: Skin is warm and dry.  Psychiatric: She has a normal mood and affect. Her behavior is normal. Judgment and thought content normal.    Diabetic Foot Exam  - Simple   Simple Foot Form Visual Inspection No deformities, no ulcerations, no other skin breakdown bilaterally:  Yes Sensation Testing See comments:  Yes Pulse Check Posterior Tibialis and Dorsalis pulse intact bilaterally:  Yes Comments Decreased sensation noted with monofilament testing to left foot.     Assessment & Plan F/U to be determined pending lab results.  -Reviewed Health Maintenance: - [facility] DIABETES FOOT EXAM; Future-today

## 2017-03-19 ENCOUNTER — Other Ambulatory Visit (INDEPENDENT_AMBULATORY_CARE_PROVIDER_SITE_OTHER): Payer: Medicare HMO

## 2017-03-19 ENCOUNTER — Encounter: Payer: Self-pay | Admitting: Nurse Practitioner

## 2017-03-19 DIAGNOSIS — E1141 Type 2 diabetes mellitus with diabetic mononeuropathy: Secondary | ICD-10-CM

## 2017-03-19 DIAGNOSIS — E782 Mixed hyperlipidemia: Secondary | ICD-10-CM

## 2017-03-19 LAB — BASIC METABOLIC PANEL
BUN: 16 mg/dL (ref 6–23)
CALCIUM: 9.4 mg/dL (ref 8.4–10.5)
CO2: 32 meq/L (ref 19–32)
Chloride: 100 mEq/L (ref 96–112)
Creatinine, Ser: 0.62 mg/dL (ref 0.40–1.20)
GFR: 101.77 mL/min (ref >60.00)
GLUCOSE: 153 mg/dL — AB (ref 70–99)
Potassium: 3.9 mEq/L (ref 3.5–5.1)
Sodium: 139 mEq/L (ref 135–145)

## 2017-03-19 LAB — LIPID PANEL
CHOL/HDL RATIO: 2
CHOLESTEROL: 171 mg/dL (ref 0–200)
HDL: 71.6 mg/dL (ref >39.00)
LDL CALC: 81 mg/dL (ref 0–99)
NonHDL: 99.16
TRIGLYCERIDES: 91 mg/dL (ref 0.0–149.0)
VLDL: 18.2 mg/dL (ref 0.0–40.0)

## 2017-03-19 LAB — HEMOGLOBIN A1C: Hgb A1c MFr Bld: 6.9 % — ABNORMAL HIGH (ref 4.6–6.5)

## 2017-03-19 NOTE — Assessment & Plan Note (Signed)
She has restarted her glipizde on her own with no recent hypoglycemia. We discussed the dangers of hypoglycemia, especially related to glipizide, and the importance of eating three full meals a day on glipizide. Ultimately, I would like to transition her to another agent and stop glipizide but we will see what her A1c is today then we can make a decision. - metFORMIN (GLUCOPHAGE) 500 MG tablet; Take 1 tablet (500 mg total) by mouth 2 (two) times daily with a meal.  Dispense: 180 tablet; Refill: 7 - Basic metabolic panel; Future - Hemoglobin A1c; Future - [facility] DIABETES FOOT EXAM; Future

## 2017-03-19 NOTE — Assessment & Plan Note (Signed)
Stable, continue current medication - lovastatin (MEVACOR) 10 MG tablet; Take 1 tablet (10 mg total) by mouth at bedtime.  Dispense: 90 tablet; Refill: 2 - Lipid panel; Future

## 2017-03-19 NOTE — Assessment & Plan Note (Signed)
Stable, continue current medication. Referral to counseling today. We also discussed nonpharmacological management of stress-See AVS for education provided to patient - escitalopram (LEXAPRO) 20 MG tablet; Take 1 tablet (20 mg total) by mouth daily.  Dispense: 90 tablet; Refill: 2 - Ambulatory referral to Psychology

## 2017-03-19 NOTE — Assessment & Plan Note (Signed)
Stable, continue current medication. We discussed routine foot care including wearing shoes at all times, routine visual foot inspection for sores or wounds. - gabapentin (NEURONTIN) 300 MG capsule; Take 1 capsule (300 mg total) by mouth 3 (three) times daily.  Dispense: 270 capsule; Refill: 0

## 2017-05-16 ENCOUNTER — Ambulatory Visit (INDEPENDENT_AMBULATORY_CARE_PROVIDER_SITE_OTHER): Payer: Medicare HMO | Admitting: Family

## 2017-05-16 ENCOUNTER — Other Ambulatory Visit (INDEPENDENT_AMBULATORY_CARE_PROVIDER_SITE_OTHER): Payer: Medicare HMO

## 2017-05-16 ENCOUNTER — Encounter: Payer: Self-pay | Admitting: Family

## 2017-05-16 VITALS — BP 140/70 | HR 78 | Temp 97.7°F | Ht 60.0 in | Wt 171.0 lb

## 2017-05-16 DIAGNOSIS — H02403 Unspecified ptosis of bilateral eyelids: Secondary | ICD-10-CM | POA: Diagnosis not present

## 2017-05-16 DIAGNOSIS — R197 Diarrhea, unspecified: Secondary | ICD-10-CM | POA: Diagnosis not present

## 2017-05-16 DIAGNOSIS — E119 Type 2 diabetes mellitus without complications: Secondary | ICD-10-CM

## 2017-05-16 LAB — COMPREHENSIVE METABOLIC PANEL
ALBUMIN: 4.1 g/dL (ref 3.5–5.2)
ALK PHOS: 73 U/L (ref 39–117)
ALT: 15 U/L (ref 0–35)
AST: 20 U/L (ref 0–37)
BUN: 16 mg/dL (ref 6–23)
CO2: 31 mEq/L (ref 19–32)
Calcium: 9.6 mg/dL (ref 8.4–10.5)
Chloride: 101 mEq/L (ref 96–112)
Creatinine, Ser: 0.6 mg/dL (ref 0.40–1.20)
GFR: 105.64 mL/min (ref >60.00)
Glucose, Bld: 192 mg/dL — ABNORMAL HIGH (ref 70–99)
POTASSIUM: 3.7 meq/L (ref 3.5–5.1)
Sodium: 141 mEq/L (ref 135–145)
TOTAL PROTEIN: 7.4 g/dL (ref 6.0–8.3)
Total Bilirubin: 0.3 mg/dL (ref 0.2–1.2)

## 2017-05-16 LAB — CBC WITH DIFFERENTIAL/PLATELET
Basophils Absolute: 0.1 10*3/uL (ref 0.0–0.1)
Basophils Relative: 1.2 % (ref 0.0–3.0)
EOS PCT: 3.7 % (ref 0.0–5.0)
Eosinophils Absolute: 0.2 10*3/uL (ref 0.0–0.7)
HCT: 38.1 % (ref 36.0–46.0)
Hemoglobin: 12.6 g/dL (ref 12.0–15.0)
Lymphocytes Relative: 31 % (ref 12.0–46.0)
Lymphs Abs: 1.5 10*3/uL (ref 0.7–4.0)
MCHC: 33.2 g/dL (ref 30.0–36.0)
MCV: 87.8 fl (ref 78.0–100.0)
MONOS PCT: 7.5 % (ref 3.0–12.0)
Monocytes Absolute: 0.4 10*3/uL (ref 0.1–1.0)
Neutro Abs: 2.8 10*3/uL (ref 1.4–7.7)
Neutrophils Relative %: 56.6 % (ref 43.0–77.0)
Platelets: 210 10*3/uL (ref 150.0–400.0)
RBC: 4.34 Mil/uL (ref 3.87–5.11)
RDW: 13.2 % (ref 11.5–15.5)
WBC: 5 10*3/uL (ref 4.0–10.5)

## 2017-05-16 MED ORDER — EMPAGLIFLOZIN 25 MG PO TABS: 25.0000 mg | ORAL_TABLET | Freq: Every day | ORAL | 2 refills | Status: DC

## 2017-05-16 MED ORDER — METFORMIN HCL ER 500 MG PO TB24: 500.0000 mg | ORAL_TABLET | Freq: Two times a day (BID) | ORAL | 1 refills | Status: DC

## 2017-05-16 NOTE — Progress Notes (Signed)
Deanna Watson is a 67 y.o. female with the following history as recorded in Epic[employer]:  Patient Active Problem List   Diagnosis Date Noted  . Chronic cholecystitis with calculus 01/23/2017  . Peripheral neuropathy 12/11/2016  . Tubular adenoma of colon 10/15/2016  . Medicare annual wellness visit, subsequent 04/04/2016  . Routine adult health maintenance 04/04/2016  . Type 2 diabetes mellitus without complication, without long-term current use of insulin ([place]) 03/01/2016  . Chest pain 03/01/2016  . Essential hypertension 03/01/2016  . Mild episode of recurrent major depressive disorder ([place]) 03/01/2016  . Mixed hyperlipidemia 03/01/2016    Current Outpatient Medications  Medication Sig Dispense Refill  . acetaminophen (TYLENOL) 500 MG tablet Take 1,000 mg by mouth 2 (two) times daily as needed for moderate pain or headache.    . albuterol (PROAIR HFA) 108 (90 Base) MCG/ACT inhaler Inhale 1-2 puffs into the lungs every 6 (six) hours as needed for wheezing or shortness of breath. 1 Inhaler 0  . budesonide-formoterol (SYMBICORT) 160-4.5 MCG/ACT inhaler Inhale 1 puff into the lungs 2 (two) times daily.    . calcium carbonate (TUMS - DOSED IN MG ELEMENTAL CALCIUM) 500 MG chewable tablet Chew 1-2 tablets by mouth daily as needed for indigestion or heartburn.    . celecoxib (CELEBREX) 200 MG capsule Take 1 capsule (200 mg total) by mouth daily. 30 capsule 2  . cetirizine (ZYRTEC) 10 MG tablet Take 10 mg by mouth daily as needed for allergies.    . Cholecalciferol (VITAMIN D3) 5000 units CAPS Take 5,000 Units by mouth daily.    . empagliflozin (JARDIANCE) 25 MG TABS tablet Take 25 mg by mouth daily. 30 tablet 2  . escitalopram (LEXAPRO) 20 MG tablet Take 1 tablet (20 mg total) by mouth daily. 90 tablet 2  . gabapentin (NEURONTIN) 300 MG capsule Take 1 capsule (300 mg total) by mouth 3 (three) times daily. 270 capsule 0  . glipiZIDE (GLUCOTROL XL) 2.5 MG 24 hr tablet Take 2.5 mg by mouth daily.     Marland Kitchen glucose blood test strip Use to check blood sugars 2 times daily. Dx Code- E11.41 100 each 12  . Ketotifen Fumarate (ITCHY EYE DROPS OP) Apply 1 drop to eye daily as needed (itching eyes).    Marland Kitchen lisinopril-hydrochlorothiazide (PRINZIDE,ZESTORETIC) 20-12.5 MG tablet Take 1 tablet by mouth daily.    Marland Kitchen lovastatin (MEVACOR) 10 MG tablet Take 1 tablet (10 mg total) by mouth at bedtime. 90 tablet 2  . Menthol (ICY HOT) 5 % PTCH Apply 1 patch topically daily as needed (pain).    . metFORMIN (GLUCOPHAGE XR) 500 MG 24 hr tablet Take 1 tablet (500 mg total) by mouth 2 (two) times daily. 60 tablet 1  . Multiple Vitamin (MULTIVITAMIN) tablet Take 1 tablet by mouth daily.    Marland Kitchen omeprazole (PRILOSEC) 20 MG capsule Take 2 capsules (40 mg total) by mouth daily. (Patient not taking: Reported on 05/16/2017) 30 capsule 0  . traMADol (ULTRAM) 50 MG tablet Take 1-2 tablets (50-100 mg total) by mouth every 6 (six) hours as needed for moderate pain or severe pain. (Patient not taking: Reported on 05/16/2017) 30 tablet 0   Current Facility-Administered Medications  Medication Dose Route Frequency Provider Last Rate Last Dose  . 0.9 %  sodium chloride infusion  500 mL Intravenous Continuous Armbruster, Carlota Raspberry, MD        Allergies: Patient has no known allergies.  Past Medical History:  Diagnosis Date  . Allergy   . Anemia  history of  . Arthritis   . Asthma   . Bell's palsy    lasted only about 2 days  . Chicken pox   . Chronic bronchitis ([place])   . COPD (chronic obstructive pulmonary disease) ([place])   . Depression   . Diabetes mellitus without complication ([place])   . Fatty liver    mild  . Fibromyalgia   . GERD (gastroesophageal reflux disease)   . Heart murmur   . History of blood transfusion   . Hyperlipidemia   . Hypertension   . Irregular heart beats   . Mild cardiomegaly   . Peripheral neuropathy   . PONV (postoperative nausea and vomiting)   . Pulmonary vascular congestion    mild  .  Sciatica   . WPW (Wolff-Parkinson-White syndrome)     Past Surgical History:  Procedure Laterality Date  . CARDIAC ELECTROPHYSIOLOGY [place] AND ABLATION  2009  . CERVICAL [place] BIOPSY    . COLONOSCOPY    . LAPAROSCOPIC CHOLECYSTECTOMY SINGLE SITE WITH INTRAOPERATIVE CHOLANGIOGRAM N/A 01/23/2017   Procedure: LAPAROSCOPIC CHOLECYSTECTOMY SINGLE SITE WITH INTRAOPERATIVE CHOLANGIOGRAM ERAS PATHWAY;  Surgeon: Michael Boston, MD;  Location: [facility];  Service: General;  Laterality: N/A;  . LIVER BIOPSY N/A 01/23/2017   Procedure: BIOPSY OF LIVER;  Surgeon: Michael Boston, MD;  Location: [facility];  Service: General;  Laterality: N/A;  . REPLACEMENT TOTAL KNEE Left 2005  . UPPER GI ENDOSCOPY      Family History  Problem Relation Age of Onset  . Arthritis Mother   . Heart disease Mother   . Stroke Mother   . Hypertension Mother   . Kidney disease Mother   . Diabetes Mother   . Alcohol abuse Father   . Epilepsy Father   . Heart disease Brother   . Ovarian cancer Paternal Aunt   . Stomach cancer Paternal Uncle   . Breast cancer Maternal Aunt   . Stomach cancer Maternal Uncle   . Colon cancer Neg Hx     Social History   Tobacco Use  . Smoking status: Never Smoker  . Smokeless tobacco: Never Used  Substance Use Topics  . Alcohol use: No    Subjective:  Patient presents with concerns for chronic problems with frequent bowel movements; notes that symptoms have been problematic since her gallbladder was removed in December 2018; at her last office visit, patient was documented to be taking Metformin 500 mg twice a day and Glipizide 2.5 mg daily; however, in the past few months, patient became concerned about blood sugar and she increased her Metformin to 2 tablets in the am and 2 tablets in the pm; she also stopped the Glipizide because her blood sugar was dropping too low; notes that she just cannot tolerate the increased dosage of Metformin; denies any blood in the stool; colonoscopy is up to  date- last done in 2018; denies any vomiting but tastes the bile in the back of her throat; + very gassy;   She is also worried about her vision; notes that one eye looks larger than the other; denies any blurred vision but notes she just doesn't feel like she is seeing as well recently.   Objective:  Vitals:   05/16/17 1323  BP: 140/70  Pulse: 78  Temp: 97.7 F (36.5 C)  TempSrc: Oral  SpO2: 96%  Weight: 171 lb 0.6 oz (77.6 kg)  Height: 5' (1.524 m)    General: Well developed, well nourished, in no acute distress  Skin : Warm  and dry.  Head: Normocephalic and atraumatic  Eyes: Sclera and conjunctiva clear; pupils round and reactive to light; extraocular movements intact  Ears: External normal; canals clear; tympanic membranes normal  Oropharynx: Pink, supple. No suspicious lesions  Neck: Supple without thyromegaly, adenopathy  Lungs: Respirations unlabored; clear to auscultation bilaterally without wheeze, rales, rhonchi  CVS exam: normal rate and regular rhythm.  Abdomen: Soft; nontender; nondistended; normoactive bowel sounds; no masses or hepatosplenomegaly  Neurologic: Alert and oriented; speech intact; face symmetrical; moves all extremities well; CNII-XII intact without focal deficit  Assessment:  1. Diarrhea, unspecified type   2. Type 2 diabetes mellitus without complication, without long-term current use of insulin (HCC)   3. Ptosis of both eyelids     Plan:  1. & 2. Suspect secondary to combination of intolerance to Metformin and body adjusting to recent gallbladder removal; will lower Metformin back to 500 mg bid ( she notes she can tolerate this with no difficulty) and try adding Jardiance 25 mg daily; check CBC, CMP today; follow-up to be determined; 3. Refer to ophthalmology- patient may be a surgical candidate;   No follow-ups on file.  Orders Placed This Encounter  Procedures  . CBC w/Diff    Standing Status:   Future    Number of Occurrences:   1     Standing Expiration Date:   05/16/2018  . Comp Met (CMET)    Standing Status:   Future    Number of Occurrences:   1    Standing Expiration Date:   05/16/2018  . Ambulatory referral to Ophthalmology    Referral Priority:   Routine    Referral Type:   Consultation    Referral Reason:   Specialty Services Required    Requested Specialty:   Ophthalmology    Number of Visits Requested:   1    Requested Prescriptions   Signed Prescriptions Disp Refills  . metFORMIN (GLUCOPHAGE XR) 500 MG 24 hr tablet 60 tablet 1    Sig: Take 1 tablet (500 mg total) by mouth 2 (two) times daily.  . empagliflozin (JARDIANCE) 25 MG TABS tablet 30 tablet 2    Sig: Take 25 mg by mouth daily.

## 2017-05-26 DIAGNOSIS — E113293 Type 2 diabetes mellitus with mild nonproliferative diabetic retinopathy without macular edema, bilateral: Secondary | ICD-10-CM | POA: Diagnosis not present

## 2017-05-26 DIAGNOSIS — H5203 Hypermetropia, bilateral: Secondary | ICD-10-CM | POA: Diagnosis not present

## 2017-05-26 DIAGNOSIS — H2513 Age-related nuclear cataract, bilateral: Secondary | ICD-10-CM | POA: Diagnosis not present

## 2017-05-26 DIAGNOSIS — H524 Presbyopia: Secondary | ICD-10-CM | POA: Diagnosis not present

## 2017-05-26 LAB — HM DIABETES EYE EXAM

## 2017-05-28 ENCOUNTER — Telehealth: Payer: Self-pay | Admitting: Nurse Practitioner

## 2017-05-28 NOTE — Telephone Encounter (Signed)
Rep will be bringing by samples later this afternoon.

## 2017-05-28 NOTE — Telephone Encounter (Signed)
We now have samples. LM informing patient that they have been placed up front for the patient to pick up. This will be for her to use until we get a response back from [place] regarding changing the medication due to cost.

## 2017-05-28 NOTE — Telephone Encounter (Signed)
Is there an alternative to Jardiance?

## 2017-05-28 NOTE — Telephone Encounter (Signed)
She needs to call her insurance to see if they will cover Invokana or Farxiga; these are both comparable to the [place].  How is her stomach doing with the medication changes?

## 2017-05-28 NOTE — Telephone Encounter (Signed)
Copied from [place] [id number]. Topic: Quick Communication - See Telephone Encounter >> May 28, 2017 10:54 AM Rutherford Nail, NT wrote: CRM for notification. See Telephone encounter for: 05/28/17. Patient calling stating she checked with her pharmacy on her empagliflozin (JARDIANCE) 25 MG TABS tablet and it was too expensive for her to pick up at this time. She is out of the medication and was calling to see if there were any samples that she could pick up today. Please advise.

## 2017-05-29 ENCOUNTER — Encounter: Payer: Self-pay | Admitting: Nurse Practitioner

## 2017-05-30 NOTE — Telephone Encounter (Signed)
Called and left message for patient.

## 2017-06-11 ENCOUNTER — Other Ambulatory Visit: Payer: Self-pay | Admitting: Nurse Practitioner

## 2017-06-11 NOTE — Telephone Encounter (Signed)
Copied from [place] [id number]. Topic: Quick Communication - Rx Refill/Question >> Jun 11, 2017  1:16 PM Deanna Watson B wrote: Medication: celecoxib (CELEBREX) 200 MG capsule [[id number]]  Has the patient contacted their pharmacy? Yes.   (Agent: If no, request that the patient contact the pharmacy for the refill.) Preferred Pharmacy (with phone number or street name): [facility] Agent: Please be advised that RX refills may take up to 3 business days. We ask that you follow-up with your pharmacy.  Medication: metFORMIN (GLUCOPHAGE XR) 500 MG 24 hr tablet [[id number]]  Pharmacy: [facility]

## 2017-06-12 ENCOUNTER — Encounter: Payer: Self-pay | Admitting: Nurse Practitioner

## 2017-06-12 DIAGNOSIS — E11319 Type 2 diabetes mellitus with unspecified diabetic retinopathy without macular edema: Secondary | ICD-10-CM | POA: Insufficient documentation

## 2017-06-13 NOTE — Telephone Encounter (Signed)
Deanna Watson on [facility] called spoke with  Staff pt has active refills on metformin and celebrex  Patient  Called and notified

## 2017-06-24 ENCOUNTER — Other Ambulatory Visit: Payer: Self-pay

## 2017-06-24 MED ORDER — METFORMIN HCL ER 500 MG PO TB24: 500.0000 mg | ORAL_TABLET | Freq: Two times a day (BID) | ORAL | 0 refills | Status: DC

## 2017-06-24 MED ORDER — CELECOXIB 200 MG PO CAPS: 200.0000 mg | ORAL_CAPSULE | Freq: Every day | ORAL | 0 refills | Status: DC

## 2017-07-01 ENCOUNTER — Telehealth: Payer: Self-pay | Admitting: Emergency Medicine

## 2017-07-01 NOTE — Telephone Encounter (Signed)
Called patient to schedule AWV. Spoke with patients husband who will have patient call back to schedule.

## 2017-07-20 ENCOUNTER — Other Ambulatory Visit: Payer: Self-pay | Admitting: Family

## 2017-08-13 ENCOUNTER — Other Ambulatory Visit (INDEPENDENT_AMBULATORY_CARE_PROVIDER_SITE_OTHER): Payer: 59

## 2017-08-13 ENCOUNTER — Encounter: Payer: Self-pay | Admitting: Nurse Practitioner

## 2017-08-13 ENCOUNTER — Ambulatory Visit (INDEPENDENT_AMBULATORY_CARE_PROVIDER_SITE_OTHER): Payer: 59 | Admitting: Nurse Practitioner

## 2017-08-13 VITALS — BP 134/64 | HR 68 | Temp 97.8°F | Resp 16 | Ht 60.0 in | Wt 167.0 lb

## 2017-08-13 DIAGNOSIS — E119 Type 2 diabetes mellitus without complications: Secondary | ICD-10-CM | POA: Diagnosis not present

## 2017-08-13 DIAGNOSIS — Z23 Encounter for immunization: Secondary | ICD-10-CM

## 2017-08-13 DIAGNOSIS — Z0001 Encounter for general adult medical examination with abnormal findings: Secondary | ICD-10-CM

## 2017-08-13 DIAGNOSIS — M2550 Pain in unspecified joint: Secondary | ICD-10-CM

## 2017-08-13 DIAGNOSIS — I10 Essential (primary) hypertension: Secondary | ICD-10-CM

## 2017-08-13 DIAGNOSIS — F330 Major depressive disorder, recurrent, mild: Secondary | ICD-10-CM | POA: Diagnosis not present

## 2017-08-13 DIAGNOSIS — E782 Mixed hyperlipidemia: Secondary | ICD-10-CM | POA: Diagnosis not present

## 2017-08-13 DIAGNOSIS — K219 Gastro-esophageal reflux disease without esophagitis: Secondary | ICD-10-CM

## 2017-08-13 LAB — HEMOGLOBIN A1C: Hgb A1c MFr Bld: 6.9 % — ABNORMAL HIGH (ref 4.6–6.5)

## 2017-08-13 LAB — URIC ACID: Uric Acid, Serum: 4.1 mg/dL (ref 2.4–7.0)

## 2017-08-13 LAB — TSH: TSH: 0.75 u[IU]/mL (ref 0.35–4.50)

## 2017-08-13 LAB — MAGNESIUM: Magnesium: 2.1 mg/dL (ref 1.5–2.5)

## 2017-08-13 NOTE — Patient Instructions (Addendum)
Please head downstairs for lab work/x-rays. If any of your test results are critically abnormal, you will be contacted right away. Otherwise, I will contact you within a week about your test results and any recommendations for abnormalities.  I will contact you with further recommendations when I get your lab results back.  Lets plan to meet back in about 6 months for diabetes follow up, or sooner if needed.  Health Maintenance, Female Adopting a healthy lifestyle and getting preventive care can go a long way to promote health and wellness. Talk with your health care provider about what schedule of regular examinations is right for you. This is a good chance for you to check in with your provider about disease prevention and staying healthy. In between checkups, there are plenty of things you can do on your own. Experts have done a lot of research about which lifestyle changes and preventive measures are most likely to keep you healthy. Ask your health care provider for more information. Weight and diet Eat a healthy diet  Be sure to include plenty of vegetables, fruits, low-fat dairy products, and lean protein.  Do not eat a lot of foods high in solid fats, added sugars, or salt.  Get regular exercise. This is one of the most important things you can do for your health. ? Most adults should exercise for at least 150 minutes each week. The exercise should increase your heart rate and make you sweat (moderate-intensity exercise). ? Most adults should also do strengthening exercises at least twice a week. This is in addition to the moderate-intensity exercise.  Maintain a healthy weight  Body mass index (BMI) is a measurement that can be used to identify possible weight problems. It estimates body fat based on height and weight. Your health care provider can help determine your BMI and help you achieve or maintain a healthy weight.  For females 2 years of age and older: ? A BMI below 18.5  is considered underweight. ? A BMI of 18.5 to 24.9 is normal. ? A BMI of 25 to 29.9 is considered overweight. ? A BMI of 30 and above is considered obese.  Watch levels of cholesterol and blood lipids  You should start having your blood tested for lipids and cholesterol at 68 years of age, then have this test every 5 years.  You may need to have your cholesterol levels checked more often if: ? Your lipid or cholesterol levels are high. ? You are older than 68 years of age. ? You are at high risk for heart disease.  Cancer screening Lung Cancer  Lung cancer screening is recommended for adults 12-43 years old who are at high risk for lung cancer because of a history of smoking.  A yearly low-dose CT scan of the lungs is recommended for people who: ? Currently smoke. ? Have quit within the past 15 years. ? Have at least a 30-pack-year history of smoking. A pack year is smoking an average of one pack of cigarettes a day for 1 year.  Yearly screening should continue until it has been 15 years since you quit.  Yearly screening should stop if you develop a health problem that would prevent you from having lung cancer treatment.  Breast Cancer  Practice breast self-awareness. This means understanding how your breasts normally appear and feel.  It also means doing regular breast self-exams. Let your health care provider know about any changes, no matter how small.  If you are in your 69s  or 67s, you should have a clinical breast exam (CBE) by a health care provider every 1-3 years as part of a regular health exam.  If you are 72 or older, have a CBE every year. Also consider having a breast X-ray (mammogram) every year.  If you have a family history of breast cancer, talk to your health care provider about genetic screening.  If you are at high risk for breast cancer, talk to your health care provider about having an MRI and a mammogram every year.  Breast cancer gene (BRCA)  assessment is recommended for women who have family members with BRCA-related cancers. BRCA-related cancers include: ? Breast. ? Ovarian. ? Tubal. ? Peritoneal cancers.  Results of the assessment will determine the need for genetic counseling and BRCA1 and BRCA2 testing.  Cervical Cancer Your health care provider may recommend that you be screened regularly for cancer of the pelvic organs (ovaries, uterus, and vagina). This screening involves a pelvic examination, including checking for microscopic changes to the surface of your cervix (Pap test). You may be encouraged to have this screening done every 3 years, beginning at age 9.  For women ages 16-65, health care providers may recommend pelvic exams and Pap testing every 3 years, or they may recommend the Pap and pelvic exam, combined with testing for human papilloma virus (HPV), every 5 years. Some types of HPV increase your risk of cervical cancer. Testing for HPV may also be done on women of any age with unclear Pap test results.  Other health care providers may not recommend any screening for nonpregnant women who are considered low risk for pelvic cancer and who do not have symptoms. Ask your health care provider if a screening pelvic exam is right for you.  If you have had past treatment for cervical cancer or a condition that could lead to cancer, you need Pap tests and screening for cancer for at least 20 years after your treatment. If Pap tests have been discontinued, your risk factors (such as having a new sexual partner) need to be reassessed to determine if screening should resume. Some women have medical problems that increase the chance of getting cervical cancer. In these cases, your health care provider may recommend more frequent screening and Pap tests.  Colorectal Cancer  This type of cancer can be detected and often prevented.  Routine colorectal cancer screening usually begins at 68 years of age and continues through 68  years of age.  Your health care provider may recommend screening at an earlier age if you have risk factors for colon cancer.  Your health care provider may also recommend using home test kits to check for hidden blood in the stool.  A small camera at the end of a tube can be used to examine your colon directly (sigmoidoscopy or colonoscopy). This is done to check for the earliest forms of colorectal cancer.  Routine screening usually begins at age 52.  Direct examination of the colon should be repeated every 5-10 years through 68 years of age. However, you may need to be screened more often if early forms of precancerous polyps or small growths are found.  Skin Cancer  Check your skin from head to toe regularly.  Tell your health care provider about any new moles or changes in moles, especially if there is a change in a mole's shape or color.  Also tell your health care provider if you have a mole that is larger than the size of a  pencil eraser.  Always use sunscreen. Apply sunscreen liberally and repeatedly throughout the day.  Protect yourself by wearing long sleeves, pants, a wide-brimmed hat, and sunglasses whenever you are outside.  Heart disease, diabetes, and high blood pressure  High blood pressure causes heart disease and increases the risk of stroke. High blood pressure is more likely to develop in: ? People who have blood pressure in the high end of the normal range (130-139/85-89 mm Hg). ? People who are overweight or obese. ? People who are African American.  If you are 79-74 years of age, have your blood pressure checked every 3-5 years. If you are 3 years of age or older, have your blood pressure checked every year. You should have your blood pressure measured twice-once when you are at a hospital or clinic, and once when you are not at a hospital or clinic. Record the average of the two measurements. To check your blood pressure when you are not at a hospital or  clinic, you can use: ? An automated blood pressure machine at a pharmacy. ? A home blood pressure monitor.  If you are between 48 years and 62 years old, ask your health care provider if you should take aspirin to prevent strokes.  Have regular diabetes screenings. This involves taking a blood sample to check your fasting blood sugar level. ? If you are at a normal weight and have a low risk for diabetes, have this test once every three years after 68 years of age. ? If you are overweight and have a high risk for diabetes, consider being tested at a younger age or more often. Preventing infection Hepatitis B  If you have a higher risk for hepatitis B, you should be screened for this virus. You are considered at high risk for hepatitis B if: ? You were born in a country where hepatitis B is common. Ask your health care provider which countries are considered high risk. ? Your parents were born in a high-risk country, and you have not been immunized against hepatitis B (hepatitis B vaccine). ? You have HIV or AIDS. ? You use needles to inject street drugs. ? You live with someone who has hepatitis B. ? You have had sex with someone who has hepatitis B. ? You get hemodialysis treatment. ? You take certain medicines for conditions, including cancer, organ transplantation, and autoimmune conditions.  Hepatitis C  Blood testing is recommended for: ? Everyone born from [birth year] through 1966. ? Anyone with known risk factors for hepatitis C.  Sexually transmitted infections (STIs)  You should be screened for sexually transmitted infections (STIs) including gonorrhea and chlamydia if: ? You are sexually active and are younger than 68 years of age. ? You are older than 68 years of age and your health care provider tells you that you are at risk for this type of infection. ? Your sexual activity has changed since you were last screened and you are at an increased risk for chlamydia or gonorrhea.  Ask your health care provider if you are at risk.  If you do not have HIV, but are at risk, it may be recommended that you take a prescription medicine daily to prevent HIV infection. This is called pre-exposure prophylaxis (PrEP). You are considered at risk if: ? You are sexually active and do not regularly use condoms or know the HIV status of your partner(s). ? You take drugs by injection. ? You are sexually active with a partner who has HIV.  Talk with your health care provider about whether you are at high risk of being infected with HIV. If you choose to begin PrEP, you should first be tested for HIV. You should then be tested every 3 months for as long as you are taking PrEP. Pregnancy  If you are premenopausal and you may become pregnant, ask your health care provider about preconception counseling.  If you may become pregnant, take 400 to 800 micrograms (mcg) of folic acid every day.  If you want to prevent pregnancy, talk to your health care provider about birth control (contraception). Osteoporosis and menopause  Osteoporosis is a disease in which the bones lose minerals and strength with aging. This can result in serious bone fractures. Your risk for osteoporosis can be identified using a bone density scan.  If you are 32 years of age or older, or if you are at risk for osteoporosis and fractures, ask your health care provider if you should be screened.  Ask your health care provider whether you should take a calcium or vitamin D supplement to lower your risk for osteoporosis.  Menopause may have certain physical symptoms and risks.  Hormone replacement therapy may reduce some of these symptoms and risks. Talk to your health care provider about whether hormone replacement therapy is right for you. Follow these instructions at home:  Schedule regular health, dental, and eye exams.  Stay current with your immunizations.  Do not use any tobacco products including cigarettes,  chewing tobacco, or electronic cigarettes.  If you are pregnant, do not drink alcohol.  If you are breastfeeding, limit how much and how often you drink alcohol.  Limit alcohol intake to no more than 1 drink per day for nonpregnant women. One drink equals 12 ounces of beer, 5 ounces of wine, or 1 ounces of hard liquor.  Do not use street drugs.  Do not share needles.  Ask your health care provider for help if you need support or information about quitting drugs.  Tell your health care provider if you often feel depressed.  Tell your health care provider if you have ever been abused or do not feel safe at home. This information is not intended to replace advice given to you by your health care provider. Make sure you discuss any questions you have with your health care provider. Document Released: 08/20/2010 Document Revised: 07/13/2015 Document Reviewed: 11/08/2014 Elsevier Interactive Patient Education  [employer].

## 2017-08-13 NOTE — Progress Notes (Signed)
Name: Deanna Watson   MRN: [id number]    DOB: 14-Mar-1950   Date:08/13/2017       Progress Note  Subjective  Chief Complaint  Chief Complaint  Patient presents with  . CPE    not fasting     HPI  Patient presents for annual CPE.  Diet, Exercise: tries to watch her diet, no routine exercise. Has lost a few pounds by watching her dietary intake and drinking more water  USPSTF grade A and B recommendations  Depression: maintained on lexapro 20 daily Reports daily medication compliance without noted adverse effects Reports good control of anxiety and depression with current dosage of lexapro Denies SI, HI  Depression screen [facility] 2/9 08/13/2017 03/14/2017 03/01/2016  Decreased Interest 0 2 0  Down, Depressed, Hopeless 0 2 1  PHQ - 2 Score 0 4 1  Altered sleeping - 2 -  Tired, decreased energy - 2 -  Change in appetite - 2 -  Feeling bad or failure about yourself  - 2 -  Trouble concentrating - 2 -  Moving slowly or fidgety/restless - 2 -  Suicidal thoughts - 0 -  PHQ-9 Score - 16 -   Hypertension: maintained lisinopril- HCTZ 20-12.5 daily Reports daily medication compliance without noted adverse effects BP Readings from Last 3 Encounters:  08/13/17 134/64  05/16/17 140/70  03/13/17 140/68   Obesity: Wt Readings from Last 3 Encounters:  08/13/17 167 lb (75.8 kg)  05/16/17 171 lb 0.6 oz (77.6 kg)  03/13/17 174 lb (78.9 kg)   BMI Readings from Last 3 Encounters:  08/13/17 32.61 kg/m  05/16/17 33.40 kg/m  03/13/17 33.98 kg/m    Alcohol: no  Tobacco use: no, never  Hep C: screening done  STD testing and prevention (chl/gon/syphilis): no concerns Intimate partner violence:denies  Abnormal Bleeding: denies Incontinence Symptoms: denies  Vaccinations: TDAP, pneumonia vaccinations given today  Advanced Care Planning: A voluntary discussion about advance care planning including the explanation and discussion of advance directives.  Discussed health care proxy and  Living will, and the patient DOES NOT  have a living will at present time. If patient does have living will, I have requested they bring this to the clinic to be scanned in to their chart.  Breast cancer: mammogram overdue, she received reminder to schedule appointment but has not called yet  Osteoporosis: DEXA scan up to date Fall prevention/vitamin D: taking calcium, vitamin D supplement  Cholesterol- maintained on lovastatin 10 daily Reports daily medication compliance without adverse medication effects  Lab Results  Component Value Date   CHOL 171 03/19/2017   HDL 71.60 03/19/2017   LDLCALC 81 03/19/2017   TRIG 91.0 03/19/2017   CHOLHDL 2 03/19/2017    Diabetes- maintained on glipizide 2.5 daily, jardiance 25 daily, metformin 500 BID Reports daily medication compliance without noted adverse medication effects. Does not report home glucose readings today  Lab Results  Component Value Date   HGBA1C 6.9 (H) 03/19/2017    Skin cancer: routinely wears sunscreen Colorectal cancer: colonoscopy up to date  Aspirin: not taking ECG: not indicated   Patient Active Problem List   Diagnosis Date Noted  . Diabetic retinopathy ([place]) 06/12/2017  . Chronic cholecystitis with calculus 01/23/2017  . Peripheral neuropathy 12/11/2016  . Tubular adenoma of colon 10/15/2016  . Medicare annual wellness visit, subsequent 04/04/2016  . Routine adult health maintenance 04/04/2016  . Type 2 diabetes mellitus without complication, without long-term current use of insulin ([place]) 03/01/2016  . Chest pain 03/01/2016  .  Essential hypertension 03/01/2016  . Mild episode of recurrent major depressive disorder ([place]) 03/01/2016  . Mixed hyperlipidemia 03/01/2016    Past Surgical History:  Procedure Laterality Date  . CARDIAC ELECTROPHYSIOLOGY [place] AND ABLATION  2009  . CERVICAL [place] BIOPSY    . COLONOSCOPY    . LAPAROSCOPIC CHOLECYSTECTOMY SINGLE SITE WITH INTRAOPERATIVE CHOLANGIOGRAM N/A  01/23/2017   Procedure: LAPAROSCOPIC CHOLECYSTECTOMY SINGLE SITE WITH INTRAOPERATIVE CHOLANGIOGRAM ERAS PATHWAY;  Surgeon: Michael Boston, MD;  Location: [facility];  Service: General;  Laterality: N/A;  . LIVER BIOPSY N/A 01/23/2017   Procedure: BIOPSY OF LIVER;  Surgeon: Michael Boston, MD;  Location: [facility];  Service: General;  Laterality: N/A;  . REPLACEMENT TOTAL KNEE Left 2005  . UPPER GI ENDOSCOPY      Family History  Problem Relation Age of Onset  . Arthritis Mother   . Heart disease Mother   . Stroke Mother   . Hypertension Mother   . Kidney disease Mother   . Diabetes Mother   . Alcohol abuse Father   . Epilepsy Father   . Heart disease Brother   . Ovarian cancer Paternal Aunt   . Stomach cancer Paternal Uncle   . Breast cancer Maternal Aunt   . Stomach cancer Maternal Uncle   . Colon cancer Neg Hx     Social History   Socioeconomic History  . Marital status: Married    Spouse name: Not on file  . Number of children: 1  . Years of education: 57  . Highest education level: Not on file  Occupational History  . Occupation: Retired  Scientific laboratory technician  . Financial resource strain: Not on file  . Food insecurity:    Worry: Not on file    Inability: Not on file  . Transportation needs:    Medical: Not on file    Non-medical: Not on file  Tobacco Use  . Smoking status: Never Smoker  . Smokeless tobacco: Never Used  Substance and Sexual Activity  . Alcohol use: No  . Drug use: No  . Sexual activity: Not on file  Lifestyle  . Physical activity:    Days per week: Not on file    Minutes per session: Not on file  . Stress: Not on file  Relationships  . Social connections:    Talks on phone: Not on file    Gets together: Not on file    Attends religious service: Not on file    Active member of club or organization: Not on file    Attends meetings of clubs or organizations: Not on file    Relationship status: Not on file  . Intimate partner violence:    Fear of current  or ex partner: Not on file    Emotionally abused: Not on file    Physically abused: Not on file    Forced sexual activity: Not on file  Other Topics Concern  . Not on file  Social History Narrative   Denies abuse and feels safe at home.      Current Outpatient Medications:  .  acetaminophen (TYLENOL) 500 MG tablet, Take 1,000 mg by mouth 2 (two) times daily as needed for moderate pain or headache., Disp: , Rfl:  .  albuterol (PROAIR HFA) 108 (90 Base) MCG/ACT inhaler, Inhale 1-2 puffs into the lungs every 6 (six) hours as needed for wheezing or shortness of breath., Disp: 1 Inhaler, Rfl: 0 .  budesonide-formoterol (SYMBICORT) 160-4.5 MCG/ACT inhaler, Inhale 1 puff into the lungs 2 (  two) times daily., Disp: , Rfl:  .  calcium carbonate (TUMS - DOSED IN MG ELEMENTAL CALCIUM) 500 MG chewable tablet, Chew 1-2 tablets by mouth daily as needed for indigestion or heartburn., Disp: , Rfl:  .  celecoxib (CELEBREX) 200 MG capsule, Take 1 capsule (200 mg total) by mouth daily., Disp: 90 capsule, Rfl: 0 .  cetirizine (ZYRTEC) 10 MG tablet, Take 10 mg by mouth daily as needed for allergies., Disp: , Rfl:  .  Cholecalciferol (VITAMIN D3) 5000 units CAPS, Take 5,000 Units by mouth daily., Disp: , Rfl:  .  empagliflozin (JARDIANCE) 25 MG TABS tablet, Take 25 mg by mouth daily., Disp: 30 tablet, Rfl: 2 .  escitalopram (LEXAPRO) 20 MG tablet, Take 1 tablet (20 mg total) by mouth daily., Disp: 90 tablet, Rfl: 2 .  gabapentin (NEURONTIN) 300 MG capsule, Take 1 capsule (300 mg total) by mouth 3 (three) times daily., Disp: 270 capsule, Rfl: 0 .  glipiZIDE (GLUCOTROL XL) 2.5 MG 24 hr tablet, Take 2.5 mg by mouth daily., Disp: , Rfl:  .  glucose blood test strip, Use to check blood sugars 2 times daily. Dx Code- E11.41, Disp: 100 each, Rfl: 12 .  Ketotifen Fumarate (ITCHY EYE DROPS OP), Apply 1 drop to eye daily as needed (itching eyes)., Disp: , Rfl:  .  lisinopril-hydrochlorothiazide (PRINZIDE,ZESTORETIC) 20-12.5  MG tablet, Take 1 tablet by mouth daily., Disp: , Rfl:  .  lovastatin (MEVACOR) 10 MG tablet, Take 1 tablet (10 mg total) by mouth at bedtime., Disp: 90 tablet, Rfl: 2 .  Menthol (ICY HOT) 5 % PTCH, Apply 1 patch topically daily as needed (pain)., Disp: , Rfl:  .  metFORMIN (GLUCOPHAGE XR) 500 MG 24 hr tablet, Take 1 tablet (500 mg total) by mouth 2 (two) times daily., Disp: 180 tablet, Rfl: 0 .  Multiple Vitamin (MULTIVITAMIN) tablet, Take 1 tablet by mouth daily., Disp: , Rfl:  .  omeprazole (PRILOSEC) 20 MG capsule, Take 2 capsules (40 mg total) by mouth daily. (Patient not taking: Reported on 05/16/2017), Disp: 30 capsule, Rfl: 0 .  traMADol (ULTRAM) 50 MG tablet, Take 1-2 tablets (50-100 mg total) by mouth every 6 (six) hours as needed for moderate pain or severe pain. (Patient not taking: Reported on 05/16/2017), Disp: 30 tablet, Rfl: 0  Current Facility-Administered Medications:  .  0.9 %  sodium chloride infusion, 500 mL, Intravenous, Continuous, Armbruster, Carlota Raspberry, MD  No Known Allergies   ROS  Constitutional: Negative for fever or weight change.  Respiratory: Negative for cough and shortness of breath.   Cardiovascular: Negative for chest pain or palpitations.  Gastrointestinal: Negative for abdominal pain, no bowel changes.  Musculoskeletal: Negative for gait problem. Positive for joint pain and swelling. Skin: Negative for rash.  Neurological: Negative for dizziness or headache.  No other specific complaints in a complete review of systems (except as listed in HPI above).  Joint swelling- This is not a new problem She c/o daily joint pain, worse in her feet. She says her feet constantly ache and occassionally her left great and second toes look red and swollen The pain is so bad sometimes that it is hard put her shoe on.  Objective  Vitals:   08/13/17 1345  BP: 134/64  Pulse: 68  Resp: 16  Temp: 97.8 F (36.6 C)  TempSrc: Oral  SpO2: 96%  Weight: 167 lb (75.8 kg)   Height: 5' (1.524 m)    Body mass index is 32.61 kg/m.  Physical Exam  Vital signs reviewed. Constitutional: Patient appears well-developed and well-nourished. No distress.  HENT: Head: Normocephalic and atraumatic. Ears: B TMs ok, no erythema or effusion; Nose: Nose normal. Mouth/Throat: Oropharynx is clear and moist. No oropharyngeal exudate.  Eyes: Conjunctivae and EOM are normal. Pupils are equal, round, and reactive to light. No scleral icterus.  Neck: Normal range of motion. Neck supple. No cervical adenopathy. No thyromegaly present.  Cardiovascular: Normal rate, regular rhythm and normal heart sounds. . No BLE edema. Distal pulses intact. Pulmonary/Chest: Effort normal and breath sounds normal. No respiratory distress. Abdominal: Soft. Bowel sounds are normal, no distension. There is no tenderness. no masses Breast: defd to mammo Musculoskeletal: Normal range of motion. No gross deformities Neurological: She is alert and oriented to person, place, and time. No cranial nerve deficit. Coordination, balance, strength, speech and gait are normal.  Skin: Skin is warm and dry. No rash noted. No erythema.  Psychiatric: Patient has a normal mood and affect. behavior is normal. Judgment and thought content normal.   Diabetic Foot Exam: Diabetic Foot Exam - Simple   Simple Foot Form Visual Inspection No deformities, no ulcerations, no other skin breakdown bilaterally:  Yes Sensation Testing Intact to touch and monofilament testing bilaterally:  Yes Pulse Check Posterior Tibialis and Dorsalis pulse intact bilaterally:  Yes Comments      Assessment & Plan RTC in 6 months for F/U: DM-recheck A1c CBC w/diff, CMET 05/16/17 WNL Lipid panel 03/19/17 stable  Arthralgia, unspecified joint Will check labs for gout F/U with further recommendations pending lab results - Uric acid; Future  Gastroesophageal reflux disease, esophagitis presence not specified Hx PPI use Update magnesium  level - Magnesium; Future

## 2017-08-14 ENCOUNTER — Encounter: Payer: Self-pay | Admitting: Nurse Practitioner

## 2017-08-14 NOTE — Assessment & Plan Note (Signed)
Stable Continue current medications Continue to monitor - TSH; Future

## 2017-08-14 NOTE — Assessment & Plan Note (Signed)
Stable Continue current medication Lipid panel up to date

## 2017-08-14 NOTE — Assessment & Plan Note (Signed)
Continue current medications Update labs F/U with further recommendations pending lab results - Hemoglobin A1c; Future - TSH; Future - HM DIABETES FOOT EXAM; Future

## 2017-08-14 NOTE — Assessment & Plan Note (Signed)
Stable Continue current medication F/U for new, worsening symptoms - TSH; Future

## 2017-08-14 NOTE — Assessment & Plan Note (Signed)
-  USPSTF grade A and B recommendations reviewed with patient; age-appropriate recommendations, preventive care, screening tests, etc discussed and encouraged; healthy living and sunscreen use encouraged; see AVS for patient education given to patient. Advanced directives packet given -Discussed importance of 150 minutes of physical activity weekly, eat 6 servings of fruit/vegetables daily and drink plenty of water and avoid sweet beverages.  - Follow up and care instructions discussed and provided in AVS.  -Reviewed Health Maintenance:  -Encouraged to schedule screening mammogram - [facility] DIABETES FOOT EXAM; Future-We discussed routine foot inspection at home, wearing supportive shoes, and avoiding going barefoot. -Need for Tdap vaccination- Tdap vaccine greater than or equal to 7yo IM -Need for 23-polyvalent pneumococcal polysaccharide vaccine- Pneumococcal polysaccharide vaccine 23-valent greater than or equal to 2yo subcutaneous/IM

## 2017-08-26 ENCOUNTER — Telehealth: Payer: Self-pay

## 2017-08-26 NOTE — Telephone Encounter (Signed)
Reason for CRM: Patient stated that she will come by to pick up the inhalers at the front desk on tomorrow, Wednesday 08/27/2017.    Thank You!!!

## 2017-08-26 NOTE — Telephone Encounter (Signed)
LVM letting pt know that I have 2 symbicort samples up front for her to pick up.

## 2017-09-01 ENCOUNTER — Other Ambulatory Visit: Payer: Self-pay | Admitting: Nurse Practitioner

## 2017-09-04 ENCOUNTER — Telehealth: Payer: Self-pay | Admitting: Nurse Practitioner

## 2017-09-04 DIAGNOSIS — M79673 Pain in unspecified foot: Secondary | ICD-10-CM

## 2017-09-04 NOTE — Telephone Encounter (Signed)
Copied from [place] [id number]. Topic: Quick Communication - See Telephone Encounter >> Sep 04, 2017  3:31 PM Gardiner Ramus wrote: CRM for notification. See Telephone encounter for: 09/04/17. Pt would like a referral to an orthopedic because she states her left foot has pain. Pt said she discussed with dr at last physical 08/13/17. Please advise

## 2017-09-05 ENCOUNTER — Other Ambulatory Visit: Payer: Self-pay | Admitting: Family

## 2017-09-05 DIAGNOSIS — M79673 Pain in unspecified foot: Secondary | ICD-10-CM

## 2017-09-05 NOTE — Telephone Encounter (Signed)
I will go on and put in referral for her. I am actually going to have her see a podiatrist- foot specialist.

## 2017-09-05 NOTE — Telephone Encounter (Signed)
Notified pt w/laura response. Pt states she would like to see Dr. Leanna Battles @ [facility] on [address]. He takes her insurance . Office # [phone]. Inform pt will notate w/referral../lmb

## 2017-09-05 NOTE — Telephone Encounter (Signed)
Deanna Watson is out of the office today.will hold until she return on Monday for her response.Marland KitchenJohny Chess

## 2017-09-12 DIAGNOSIS — M2041 Other hammer toe(s) (acquired), right foot: Secondary | ICD-10-CM | POA: Diagnosis not present

## 2017-09-12 DIAGNOSIS — G5762 Lesion of plantar nerve, left lower limb: Secondary | ICD-10-CM | POA: Diagnosis not present

## 2017-09-12 DIAGNOSIS — M2042 Other hammer toe(s) (acquired), left foot: Secondary | ICD-10-CM | POA: Diagnosis not present

## 2017-09-18 DIAGNOSIS — G5762 Lesion of plantar nerve, left lower limb: Secondary | ICD-10-CM | POA: Diagnosis not present

## 2017-09-25 ENCOUNTER — Other Ambulatory Visit: Payer: Self-pay

## 2017-09-25 MED ORDER — METFORMIN HCL ER 500 MG PO TB24: 500.0000 mg | ORAL_TABLET | Freq: Two times a day (BID) | ORAL | 2 refills | Status: DC

## 2017-10-01 ENCOUNTER — Telehealth: Payer: Self-pay | Admitting: Nurse Practitioner

## 2017-10-01 DIAGNOSIS — E1141 Type 2 diabetes mellitus with diabetic mononeuropathy: Secondary | ICD-10-CM

## 2017-10-01 NOTE — Telephone Encounter (Signed)
Copied from [place] [id number]. Topic: Quick Communication - Rx Refill/Question >> Oct 01, 2017  1:33 PM Margot Ables wrote: Medication: gabapentin (NEURONTIN) 300 MG capsule - pt is out of meds - pt states that [facility] was supposed to contact office a week ago - they advised no RX on file - send in 90 day supply  Has the patient contacted their pharmacy? yes Preferred Pharmacy (with phone number or street name): [facility]   Pt requesting call from [facility] : [facility] regarding [place] 25mg . She is asking if there are samples available. Please call to advise.

## 2017-10-02 MED ORDER — GABAPENTIN 300 MG PO CAPS: 300.0000 mg | ORAL_CAPSULE | Freq: Three times a day (TID) | ORAL | 0 refills | Status: DC

## 2017-10-02 MED ORDER — EMPAGLIFLOZIN 25 MG PO TABS: 25.0000 mg | ORAL_TABLET | Freq: Every day | ORAL | 0 refills | Status: DC

## 2017-10-02 NOTE — Telephone Encounter (Signed)
Okay please let me know if alternative needs to be sent

## 2017-10-02 NOTE — Telephone Encounter (Signed)
Spoke with pt and advised that we had no jardiance samples. She asked me to send in the jardiance to [facility] and she would call to see how much the medication is. If it is too expensive she would like an alternative sent in that is similar to the jardiance.

## 2017-10-06 ENCOUNTER — Other Ambulatory Visit: Payer: Self-pay | Admitting: Nurse Practitioner

## 2017-10-06 DIAGNOSIS — E1141 Type 2 diabetes mellitus with diabetic mononeuropathy: Secondary | ICD-10-CM

## 2017-10-17 ENCOUNTER — Telehealth: Payer: Self-pay | Admitting: Nurse Practitioner

## 2017-10-17 NOTE — Telephone Encounter (Signed)
Pt called regarding her message for a different medication for her diabetes. No answer, left message and advised to call back for any questions.

## 2017-10-17 NOTE — Telephone Encounter (Signed)
Copied from [place] [id number]. Topic: Quick Communication - See Telephone Encounter >> Oct 17, 2017  3:57 PM Mylinda Latina, NT wrote: CRM for notification. See Telephone encounter for: 10/17/17. Patient called and states she was prescribed empagliflozin (JARDIANCE) 25 MG TABS tablet. She states now she cant afford the Jardiance and her blood sugar is elevated. She states it was a high as 181. She is wondering is there anything else closer to the jardiance that she can take . Please advise... Patient states she has been out of this medication for a week   [facility] [address] ([address]), [place] [phone] (Phone) [phone] (Fax)

## 2017-10-17 NOTE — Telephone Encounter (Signed)
Please review

## 2017-10-21 NOTE — Telephone Encounter (Signed)
I called pt to advise of below. Bad connection. Call was dropped. Will try again later or PEC can inform pt of below.

## 2017-10-21 NOTE — Telephone Encounter (Signed)
There are 2 other medications similar to Jardiance; 1)Farxiga or 2) Invokana; she had indicated to [place] in mid-August she was going to check on her formulary coverage. This would be really helpful if she could do this please.

## 2017-10-22 NOTE — Telephone Encounter (Signed)
° °  Pt called back will check with insurance to see what they cover

## 2017-10-24 ENCOUNTER — Other Ambulatory Visit: Payer: Self-pay

## 2017-10-24 ENCOUNTER — Ambulatory Visit (HOSPITAL_COMMUNITY)
Admission: EM | Admit: 2017-10-24 | Discharge: 2017-10-24 | Disposition: A | Discharge status: Home / Self Care | Payer: Medicare HMO | Attending: Internal Medicine | Admitting: Internal Medicine

## 2017-10-24 DIAGNOSIS — M25561 Pain in right knee: Secondary | ICD-10-CM

## 2017-10-24 DIAGNOSIS — M5441 Lumbago with sciatica, right side: Secondary | ICD-10-CM | POA: Diagnosis not present

## 2017-10-24 MED ORDER — KETOROLAC TROMETHAMINE 60 MG/2ML IM SOLN
60.0000 mg | Freq: Once | INTRAMUSCULAR | Status: AC
  Administered 2017-10-24: 60 mg via INTRAMUSCULAR

## 2017-10-24 MED ORDER — METHYLPREDNISOLONE SODIUM SUCC 125 MG IJ SOLR
125.0000 mg | Freq: Once | INTRAMUSCULAR | Status: AC
  Administered 2017-10-24: 125 mg via INTRAMUSCULAR

## 2017-10-24 MED ORDER — METHYLPREDNISOLONE SODIUM SUCC 125 MG IJ SOLR
INTRAMUSCULAR | Status: AC
  Filled 2017-10-24: qty 2

## 2017-10-24 MED ORDER — TRAMADOL HCL 50 MG PO TABS: 50.0000 mg | ORAL_TABLET | Freq: Four times a day (QID) | ORAL | 0 refills | Status: DC | PRN

## 2017-10-24 MED ORDER — KETOROLAC TROMETHAMINE 60 MG/2ML IM SOLN
INTRAMUSCULAR | Status: AC
  Filled 2017-10-24: qty 2

## 2017-10-24 NOTE — Discharge Instructions (Addendum)
Although the pain is worse around your right knee, it seems likely that right sided sciatica is contributing to this pain.  Injections of ketorolac (anti inflammatory/pain reliever) and Solu-Medrol (steroid) were given at the urgent care today, to try and help decrease pain.   Ice to the low back, for 5 to 10 minutes, several times daily, may also help decrease pain.  Physical therapy can also be helpful in decreasing pain from sciatica.  A prescription for a few tramadol (15 tablets) was sent to the pharmacy, to help with the emergency pain until you can see your primary care provider on Monday 9/9.  Anticipate gradual improvement in discomfort over the next couple of weeks.

## 2017-10-24 NOTE — ED Provider Notes (Signed)
[place]    CSN: [id number] Arrival date & time: 10/24/17  1611     History   Chief Complaint Chief Complaint  Patient presents with  . Knee Pain    HPI Deanna Watson is a 67 y.o. female.   She has past medical history of right-sided sciatica and left knee replacement.  She presents today with pain around the right knee, seems to radiate up the right thigh and into the right buttock and low back.  This pain started about 2 weeks ago, after she drove to [place].  Pain has been getting worse.  The pain is so severe that is making her limp worse than usual.  She does not think the leg is weak, just that it hurts so much to move it.  No change in bowel or bladder function.  No loss of sensation.  She has tried pain patches to the knee, which have not been helpful.  Pain is bad during the daytime, and she is not sleeping at night because of pain.  She has an appointment with her PCP on Monday 9/9.    HPI  Past Medical History:  Diagnosis Date  . Allergy   . Anemia    history of  . Arthritis   . Asthma   . Bell's palsy    lasted only about 2 days  . Chicken pox   . Chronic bronchitis ([place])   . COPD (chronic obstructive pulmonary disease) ([place])   . Depression   . Diabetes mellitus without complication ([place])   . Fatty liver    mild  . Fibromyalgia   . GERD (gastroesophageal reflux disease)   . Heart murmur   . History of blood transfusion   . Hyperlipidemia   . Hypertension   . Irregular heart beats   . Mild cardiomegaly   . Peripheral neuropathy   . PONV (postoperative nausea and vomiting)   . Pulmonary vascular congestion    mild  . Sciatica   . WPW (Wolff-Parkinson-White syndrome)     Patient Active Problem List   Diagnosis Date Noted  . Diabetic retinopathy ([place]) 06/12/2017  . Chronic cholecystitis with calculus 01/23/2017  . Peripheral neuropathy 12/11/2016  . Tubular adenoma of colon 10/15/2016  . Medicare annual wellness visit, subsequent  04/04/2016  . Routine adult health maintenance 04/04/2016  . Type 2 diabetes mellitus without complication, without long-term current use of insulin ([place]) 03/01/2016  . Essential hypertension 03/01/2016  . Mild episode of recurrent major depressive disorder ([place]) 03/01/2016  . Mixed hyperlipidemia 03/01/2016    Past Surgical History:  Procedure Laterality Date  . CARDIAC ELECTROPHYSIOLOGY [place] AND ABLATION  2009  . CERVICAL CONE BIOPSY    . COLONOSCOPY    . LAPAROSCOPIC CHOLECYSTECTOMY SINGLE SITE WITH INTRAOPERATIVE CHOLANGIOGRAM N/A 01/23/2017   Procedure: LAPAROSCOPIC CHOLECYSTECTOMY SINGLE SITE WITH INTRAOPERATIVE CHOLANGIOGRAM ERAS PATHWAY;  Surgeon: Michael Boston, MD;  Location: [facility];  Service: General;  Laterality: N/A;  . LIVER BIOPSY N/A 01/23/2017   Procedure: BIOPSY OF LIVER;  Surgeon: Michael Boston, MD;  Location: [facility];  Service: General;  Laterality: N/A;  . REPLACEMENT TOTAL KNEE Left 2005  . UPPER GI ENDOSCOPY        Home Medications    Prior to Admission medications   Medication Sig Start Date End Date Taking? Authorizing Provider  acetaminophen (TYLENOL) 500 MG tablet Take 1,000 mg by mouth 2 (two) times daily as needed for moderate pain or headache.    [provider]  budesonide-formoterol (SYMBICORT) 160-4.5 MCG/ACT inhaler Inhale 1 puff into the lungs 2 (two) times daily.    [provider]  calcium carbonate (TUMS - DOSED IN MG ELEMENTAL CALCIUM) 500 MG chewable tablet Chew 1-2 tablets by mouth daily as needed for indigestion or heartburn.    [provider]  celecoxib (CELEBREX) 200 MG capsule TAKE 1 CAPSULE EVERY DAY 09/02/17   Lance Sell, NP  cetirizine (ZYRTEC) 10 MG tablet Take 10 mg by mouth daily as needed for allergies.    [provider]  Cholecalciferol (VITAMIN D3) 5000 units CAPS Take 5,000 Units by mouth daily.    [provider]  empagliflozin (JARDIANCE) 25 MG TABS tablet Take 25 mg by  mouth daily. 10/02/17   Lance Sell, NP  escitalopram (LEXAPRO) 20 MG tablet Take 1 tablet (20 mg total) by mouth daily. 03/13/17   Lance Sell, NP  gabapentin (NEURONTIN) 300 MG capsule Take 1 capsule (300 mg total) by mouth 3 (three) times daily. 10/02/17   Lance Sell, NP  glipiZIDE (GLUCOTROL XL) 2.5 MG 24 hr tablet Take 2.5 mg by mouth daily.    [provider]  glucose blood test strip Use to check blood sugars 2 times daily. Dx Code- E11.41 03/13/17   Lance Sell, NP  Ketotifen Fumarate (ITCHY EYE DROPS OP) Apply 1 drop to eye daily as needed (itching eyes).    [provider]  lisinopril-hydrochlorothiazide (PRINZIDE,ZESTORETIC) 20-12.5 MG tablet Take 1 tablet by mouth daily.    [provider]  lovastatin (MEVACOR) 10 MG tablet Take 1 tablet (10 mg total) by mouth at bedtime. 03/13/17   Lance Sell, NP  Menthol (ICY HOT) 5 % PTCH Apply 1 patch topically daily as needed (pain).    [provider]  metFORMIN (GLUCOPHAGE-XR) 500 MG 24 hr tablet Take 1 tablet (500 mg total) by mouth 2 (two) times daily. 09/25/17   Lance Sell, NP  Multiple Vitamin (MULTIVITAMIN) tablet Take 1 tablet by mouth daily.    [provider]  omeprazole (PRILOSEC) 20 MG capsule Take 2 capsules (40 mg total) by mouth daily. Patient not taking: Reported on 05/16/2017 12/11/16   Yetta Flock, MD  traMADol (ULTRAM) 50 MG tablet Take 1-2 tablets (50-100 mg total) by mouth every 6 (six) hours as needed for moderate pain or severe pain. Patient not taking: Reported on 05/16/2017 01/23/17   Michael Boston, MD  traMADol (ULTRAM) 50 MG tablet Take 1 tablet (50 mg total) by mouth every 6 (six) hours as needed. 10/24/17   Wynona Luna, MD    Family History Family History  Problem Relation Age of Onset  . Arthritis Mother   . Heart disease Mother   . Stroke Mother   . Hypertension Mother   . Kidney disease Mother   .  Diabetes Mother   . Alcohol abuse Father   . Epilepsy Father   . Heart disease Brother   . Ovarian cancer Paternal Aunt   . Stomach cancer Paternal Uncle   . Breast cancer Maternal Aunt   . Stomach cancer Maternal Uncle   . Colon cancer Neg Hx     Social History Social History   Tobacco Use  . Smoking status: Never Smoker  . Smokeless tobacco: Never Used  Substance Use Topics  . Alcohol use: No  . Drug use: No     Allergies   Patient has no known allergies.   Review of Systems Review of  Systems  All other systems reviewed and are negative.    Physical Exam Triage Vital Signs ED Triage Vitals  Enc Vitals Group     BP 10/24/17 1639 137/76     Pulse Rate 10/24/17 1639 63     Resp 10/24/17 1639 18     Temp 10/24/17 1639 97.6 F (36.4 C)     Temp Source 10/24/17 1639 Oral     SpO2 10/24/17 1639 100 %     Weight 10/24/17 1640 167 lb 12.8 oz (76.1 kg)     Height --      Pain Score --      Pain Loc --    Updated Vital Signs BP 137/76 (BP Location: Right Arm)   Pulse 63   Temp 97.6 F (36.4 C) (Oral)   Resp 18   Wt 76.1 kg   SpO2 100%   BMI 32.77 kg/m  Physical Exam  Constitutional: She is oriented to person, place, and time. No distress.  Seated in a push chair  HENT:  Head: Atraumatic.  Eyes:  Conjugate gaze observed, no eye redness/discharge  Neck: Neck supple.  Cardiovascular: Normal rate.  Pulmonary/Chest: No respiratory distress.  Abdominal: She exhibits no distension.  Musculoskeletal: Normal range of motion.  Bilateral knee contours are similar, with a well-healed incision site across the left knee.  No apparent effusion, no focal tenderness or warmth/bruising/redness to either knee.  She is able to flex and extend the right knee, painful.  Neurological: She is alert and oriented to person, place, and time.  Skin: Skin is warm and dry.  Nursing note and vitals reviewed.    UC Treatments / Results   Procedures Procedures (including  critical care time)  Medications Ordered in UC Medications  ketorolac (TORADOL) injection 60 mg (60 mg Intramuscular Given 10/24/17 1751)  methylPREDNISolone sodium succinate (SOLU-MEDROL) 125 mg/2 mL injection 125 mg (125 mg Intramuscular Given 10/24/17 1751)    Final Clinical Impressions(s) / UC Diagnoses   Final diagnoses:  Acute right-sided low back pain with right-sided sciatica  Acute pain of right knee     Discharge Instructions     Although the pain is worse around your right knee, it seems likely that right sided sciatica is contributing to this pain.  Injections of ketorolac (anti inflammatory/pain reliever) and Solu-Medrol (steroid) were given at the urgent care today, to try and help decrease pain.   Ice to the low back, for 5 to 10 minutes, several times daily, may also help decrease pain.  Physical therapy can also be helpful in decreasing pain from sciatica.  A prescription for a few tramadol (15 tablets) was sent to the pharmacy, to help with the emergency pain until you can see your primary care provider on Monday 9/9.  Anticipate gradual improvement in discomfort over the next couple of weeks.     ED Prescriptions    Medication Sig Dispense Auth. Provider   traMADol (ULTRAM) 50 MG tablet Take 1 tablet (50 mg total) by mouth every 6 (six) hours as needed. 15 tablet Wynona Luna, MD     Controlled Substance Prescriptions [place] Controlled Substance Registry consulted? Yes, I have consulted the [place] Controlled Substances Registry for this patient, and feel the risk/benefit ratio today is favorable for proceeding with this prescription for a controlled substance.   Wynona Luna, MD 10/26/17 [phone]

## 2017-10-24 NOTE — ED Triage Notes (Signed)
Pt states she has knee pain (RT). Pt has a total knee replacement (LT)

## 2017-10-27 ENCOUNTER — Encounter (HOSPITAL_COMMUNITY): Payer: Self-pay | Admitting: Emergency Medicine

## 2017-10-27 ENCOUNTER — Emergency Department (HOSPITAL_COMMUNITY)
Admission: EM | Admit: 2017-10-27 | Discharge: 2017-10-27 | Disposition: A | Discharge status: Home / Self Care | Payer: Medicare HMO | Attending: Emergency Medicine | Admitting: Emergency Medicine

## 2017-10-27 ENCOUNTER — Other Ambulatory Visit: Payer: Self-pay

## 2017-10-27 ENCOUNTER — Emergency Department (HOSPITAL_COMMUNITY): Payer: Medicare HMO

## 2017-10-27 ENCOUNTER — Encounter: Payer: Self-pay | Admitting: Nurse Practitioner

## 2017-10-27 ENCOUNTER — Ambulatory Visit (INDEPENDENT_AMBULATORY_CARE_PROVIDER_SITE_OTHER): Payer: Medicare HMO | Admitting: Nurse Practitioner

## 2017-10-27 VITALS — BP 150/88 | HR 71 | Ht 60.0 in | Wt 168.0 lb

## 2017-10-27 DIAGNOSIS — I10 Essential (primary) hypertension: Secondary | ICD-10-CM

## 2017-10-27 DIAGNOSIS — R0789 Other chest pain: Secondary | ICD-10-CM | POA: Diagnosis not present

## 2017-10-27 DIAGNOSIS — E119 Type 2 diabetes mellitus without complications: Secondary | ICD-10-CM

## 2017-10-27 DIAGNOSIS — R079 Chest pain, unspecified: Secondary | ICD-10-CM | POA: Insufficient documentation

## 2017-10-27 DIAGNOSIS — Z5321 Procedure and treatment not carried out due to patient leaving prior to being seen by health care provider: Secondary | ICD-10-CM | POA: Diagnosis not present

## 2017-10-27 LAB — CBC
HCT: 39.4 % (ref 36.0–46.0)
HEMOGLOBIN: 12.2 g/dL (ref 12.0–15.0)
MCH: 28.5 pg (ref 26.0–34.0)
MCHC: 31 g/dL (ref 30.0–36.0)
MCV: 92.1 fL (ref 78.0–100.0)
Platelets: 195 10*3/uL (ref 150–400)
RBC: 4.28 MIL/uL (ref 3.87–5.11)
RDW: 12.4 % (ref 11.5–15.5)
WBC: 6.8 10*3/uL (ref 4.0–10.5)

## 2017-10-27 LAB — BASIC METABOLIC PANEL
ANION GAP: 14 (ref 5–15)
BUN: 19 mg/dL (ref 8–23)
CHLORIDE: 100 mmol/L (ref 98–111)
CO2: 24 mmol/L (ref 22–32)
Calcium: 9.6 mg/dL (ref 8.9–10.3)
Creatinine, Ser: 0.71 mg/dL (ref 0.44–1.00)
GFR calc Af Amer: >60 mL/min (ref >60)
GFR calc non Af Amer: >60 mL/min (ref >60)
GLUCOSE: 214 mg/dL — AB (ref 70–99)
POTASSIUM: 4.2 mmol/L (ref 3.5–5.1)
Sodium: 138 mmol/L (ref 135–145)

## 2017-10-27 LAB — I-STAT TROPONIN, ED: Troponin i, poc: 0.02 ng/mL (ref 0.00–0.08)

## 2017-10-27 MED ORDER — LISINOPRIL-HYDROCHLOROTHIAZIDE 20-12.5 MG PO TABS: 1.0000 | ORAL_TABLET | Freq: Every day | ORAL | 1 refills | Status: DC

## 2017-10-27 NOTE — ED Triage Notes (Signed)
Pt reports being sent from her doctors office because she was having 8/10 generalized chest pain radiating to upper back that started last night. Hx of HTN, WPW and diabetes. Pt reports originally going to her doctors office for her chronic left knee pain.

## 2017-10-27 NOTE — Progress Notes (Signed)
Name: Deanna Watson   MRN: [id number]    DOB: 09-Sep-1950   Date:10/27/2017       Progress Note  Subjective  Chief Complaint Follow up  HPI  Deanna Watson is here today to discuss diabetes, she says she has been off her jardiance due to cost for a few weeks now, and has noticed elevated glucose readings in the 200s-300s at home. She also tells me that since yesterday, she has been feeling tightness in her midsternal chest and short of breath, occasionally feeling dizzy and feeling her heart skip beats. The chest pain has been constant since onset, and is not increased with movement or activity. She also reports a recent dry cough. She denies fevers, syncope, nausea, vomiting. Her blood pressure is slightly elevated today, she says she has been off her blood pressure medication due to no refills for the past several weeks  BP Readings from Last 3 Encounters:  10/27/17 (!) 150/88  10/24/17 137/76  08/13/17 134/64     Patient Active Problem List   Diagnosis Date Noted  . Diabetic retinopathy ([place]) 06/12/2017  . Chronic cholecystitis with calculus 01/23/2017  . Peripheral neuropathy 12/11/2016  . Tubular adenoma of colon 10/15/2016  . Medicare annual wellness visit, subsequent 04/04/2016  . Routine adult health maintenance 04/04/2016  . Type 2 diabetes mellitus without complication, without long-term current use of insulin ([place]) 03/01/2016  . Essential hypertension 03/01/2016  . Mild episode of recurrent major depressive disorder ([place]) 03/01/2016  . Mixed hyperlipidemia 03/01/2016    Past Surgical History:  Procedure Laterality Date  . CARDIAC ELECTROPHYSIOLOGY [place] AND ABLATION  2009  . CERVICAL CONE BIOPSY    . COLONOSCOPY    . LAPAROSCOPIC CHOLECYSTECTOMY SINGLE SITE WITH INTRAOPERATIVE CHOLANGIOGRAM N/A 01/23/2017   Procedure: LAPAROSCOPIC CHOLECYSTECTOMY SINGLE SITE WITH INTRAOPERATIVE CHOLANGIOGRAM ERAS PATHWAY;  Surgeon: Michael Boston, MD;  [facility];  Service:  General;  Laterality: N/A;  . LIVER BIOPSY N/A 01/23/2017   Procedure: BIOPSY OF LIVER;  Surgeon: Michael Boston, MD;  Location: [facility];  Service: General;  Laterality: N/A;  . REPLACEMENT TOTAL KNEE Left 2005  . UPPER GI ENDOSCOPY      Family History  Problem Relation Age of Onset  . Arthritis Mother   . Heart disease Mother   . Stroke Mother   . Hypertension Mother   . Kidney disease Mother   . Diabetes Mother   . Alcohol abuse Father   . Epilepsy Father   . Heart disease Brother   . Ovarian cancer Paternal Aunt   . Stomach cancer Paternal Uncle   . Breast cancer Maternal Aunt   . Stomach cancer Maternal Uncle   . Colon cancer Neg Hx     Social History   Socioeconomic History  . Marital status: Married    Spouse name: Not on file  . Number of children: 1  . Years of education: 109  . Highest education level: Not on file  Occupational History  . Occupation: Retired  Scientific laboratory technician  . Financial resource strain: Not on file  . Food insecurity:    Worry: Not on file    Inability: Not on file  . Transportation needs:    Medical: Not on file    Non-medical: Not on file  Tobacco Use  . Smoking status: Never Smoker  . Smokeless tobacco: Never Used  Substance and Sexual Activity  . Alcohol use: No  . Drug use: No  . Sexual activity: Not on file  Lifestyle  .  Physical activity:    Days per week: Not on file    Minutes per session: Not on file  . Stress: Not on file  Relationships  . Social connections:    Talks on phone: Not on file    Gets together: Not on file    Attends religious service: Not on file    Active member of club or organization: Not on file    Attends meetings of clubs or organizations: Not on file    Relationship status: Not on file  . Intimate partner violence:    Fear of current or ex partner: Not on file    Emotionally abused: Not on file    Physically abused: Not on file    Forced sexual activity: Not on file  Other Topics Concern  . Not on  file  Social History Narrative   Denies abuse and feels safe at home.      Current Outpatient Medications:  .  acetaminophen (TYLENOL) 500 MG tablet, Take 1,000 mg by mouth 2 (two) times daily as needed for moderate pain or headache., Disp: , Rfl:  .  budesonide-formoterol (SYMBICORT) 160-4.5 MCG/ACT inhaler, Inhale 1 puff into the lungs 2 (two) times daily., Disp: , Rfl:  .  calcium carbonate (TUMS - DOSED IN MG ELEMENTAL CALCIUM) 500 MG chewable tablet, Chew 1-2 tablets by mouth daily as needed for indigestion or heartburn., Disp: , Rfl:  .  celecoxib (CELEBREX) 200 MG capsule, TAKE 1 CAPSULE EVERY DAY, Disp: 90 capsule, Rfl: 0 .  cetirizine (ZYRTEC) 10 MG tablet, Take 10 mg by mouth daily as needed for allergies., Disp: , Rfl:  .  Cholecalciferol (VITAMIN D3) 5000 units CAPS, Take 5,000 Units by mouth daily., Disp: , Rfl:  .  escitalopram (LEXAPRO) 20 MG tablet, Take 1 tablet (20 mg total) by mouth daily., Disp: 90 tablet, Rfl: 2 .  gabapentin (NEURONTIN) 300 MG capsule, Take 1 capsule (300 mg total) by mouth 3 (three) times daily., Disp: 270 capsule, Rfl: 0 .  glipiZIDE (GLUCOTROL XL) 2.5 MG 24 hr tablet, Take 2.5 mg by mouth daily., Disp: , Rfl:  .  glucose blood test strip, Use to check blood sugars 2 times daily. Dx Code- E11.41, Disp: 100 each, Rfl: 12 .  Ketotifen Fumarate (ITCHY EYE DROPS OP), Apply 1 drop to eye daily as needed (itching eyes)., Disp: , Rfl:  .  lisinopril-hydrochlorothiazide (PRINZIDE,ZESTORETIC) 20-12.5 MG tablet, Take 1 tablet by mouth daily., Disp: 90 tablet, Rfl: 1 .  lovastatin (MEVACOR) 10 MG tablet, Take 1 tablet (10 mg total) by mouth at bedtime., Disp: 90 tablet, Rfl: 2 .  Menthol (ICY HOT) 5 % PTCH, Apply 1 patch topically daily as needed (pain)., Disp: , Rfl:  .  metFORMIN (GLUCOPHAGE-XR) 500 MG 24 hr tablet, Take 1 tablet (500 mg total) by mouth 2 (two) times daily., Disp: 180 tablet, Rfl: 2 .  Multiple Vitamin (MULTIVITAMIN) tablet, Take 1 tablet by  mouth daily., Disp: , Rfl:  .  omeprazole (PRILOSEC) 20 MG capsule, Take 2 capsules (40 mg total) by mouth daily., Disp: 30 capsule, Rfl: 0 .  traMADol (ULTRAM) 50 MG tablet, Take 1-2 tablets (50-100 mg total) by mouth every 6 (six) hours as needed for moderate pain or severe pain., Disp: 30 tablet, Rfl: 0 .  traMADol (ULTRAM) 50 MG tablet, Take 1 tablet (50 mg total) by mouth every 6 (six) hours as needed., Disp: 15 tablet, Rfl: 0 .  empagliflozin (JARDIANCE) 25 MG TABS tablet, Take 25  mg by mouth daily. (Patient not taking: Reported on 10/27/2017), Disp: 90 tablet, Rfl: 0  Current Facility-Administered Medications:  .  0.9 %  sodium chloride infusion, 500 mL, Intravenous, Continuous, Armbruster, Carlota Raspberry, MD  No Known Allergies   ROS See HPI  Objective  Vitals:   10/27/17 1311  BP: (!) 150/88  Pulse: 71  SpO2: 97%  Weight: 168 lb (76.2 kg)  Height: 5' (1.524 m)    Body mass index is 32.81 kg/m.  Physical Exam Vital signs reviewed. Constitutional: Patient appears well-developed and well-nourished. No distress.  HENT: Head: Normocephalic and atraumatic. Nose: Nose normal. Mouth/Throat: Oropharynx is clear and moist. No oropharyngeal exudate.  Eyes: Conjunctivae and EOM are normal. Pupils are equal, round, and reactive to light. No scleral icterus.  Neck: Normal range of motion. Neck supple. Cardiovascular: Normal rate, irregular rhythm and normal heart sounds.  No BLE edema. Distal pulses intact. Pulmonary/Chest: Effort normal and breath sounds normal. No respiratory distress. Neurological: She is alert and oriented to person, place, and time. No cranial nerve deficit. Coordination, balance, strength, speech and gait are normal.  Skin: Skin is warm and dry. No rash noted. No erythema.  Psychiatric: Patient has a normal mood and affect. behavior is normal. Judgment and thought content normal.   Assessment & Plan RTC for follow up after ED evaluation for chest pain; DM,  HTN  Tightness in chest Due to active chest pain, questionable EKG changes, and irregular heart beat noted on PE, I advised immediate transfer to the ED for further evaluation via ambulance She is agreeable to transfer to ED but refuses EMS transport and tells me that she will drive herself to the ED as she drove herself here. She will go immediately to [facility] for [facility], [facility] charge RN called to notify - EKG 12-Lead- I have personally reviewed the EKG tracing and agree with computerized printout as noted: sinus rhythm, changes noted from last EKG date 09/20/2016, with questionable ischemia

## 2017-10-27 NOTE — Assessment & Plan Note (Signed)
Lisinopril- HCTZ Refill sent, Resume daily as prescribed Instructed to schedule HTN follow up after ER evaluatoin - lisinopril-hydrochlorothiazide (PRINZIDE,ZESTORETIC) 20-12.5 MG tablet; Take 1 tablet by mouth daily.  Dispense: 90 tablet; Refill: 1

## 2017-10-27 NOTE — Patient Instructions (Addendum)
Please go immediately to the ER for further evaluation of chest pain.

## 2017-10-27 NOTE — Assessment & Plan Note (Signed)
No changes today, she is being sent to the ER For chest pain evaluation Instructed to schedule DM follow up after ER evaluation

## 2017-10-28 ENCOUNTER — Telehealth: Payer: Self-pay | Admitting: Nurse Practitioner

## 2017-10-28 NOTE — Telephone Encounter (Signed)
Copied from [place] [id number]. Topic: Referral - Request >> Oct 28, 2017  3:08 PM Mylinda Latina, NT wrote: Deanna Watson for CRM: Patient called and states she needs a referral to a cardiologist per Emergency Department Chest pain . Please call patient when the referral is placed CB# [phone]

## 2017-10-30 ENCOUNTER — Ambulatory Visit (INDEPENDENT_AMBULATORY_CARE_PROVIDER_SITE_OTHER)
Admission: RE | Admit: 2017-10-30 | Discharge: 2017-10-30 | Disposition: A | Discharge status: Home / Self Care | Payer: Medicare HMO | Source: Ambulatory Visit | Attending: Nurse Practitioner | Admitting: Nurse Practitioner

## 2017-10-30 ENCOUNTER — Encounter: Payer: Self-pay | Admitting: Nurse Practitioner

## 2017-10-30 ENCOUNTER — Ambulatory Visit (INDEPENDENT_AMBULATORY_CARE_PROVIDER_SITE_OTHER): Payer: Medicare HMO | Admitting: Nurse Practitioner

## 2017-10-30 VITALS — BP 160/84 | HR 77 | Ht 60.0 in | Wt 168.0 lb

## 2017-10-30 DIAGNOSIS — M79604 Pain in right leg: Secondary | ICD-10-CM

## 2017-10-30 DIAGNOSIS — R0789 Other chest pain: Secondary | ICD-10-CM

## 2017-10-30 DIAGNOSIS — E119 Type 2 diabetes mellitus without complications: Secondary | ICD-10-CM | POA: Diagnosis not present

## 2017-10-30 DIAGNOSIS — I10 Essential (primary) hypertension: Secondary | ICD-10-CM

## 2017-10-30 DIAGNOSIS — M25561 Pain in right knee: Secondary | ICD-10-CM | POA: Diagnosis not present

## 2017-10-30 MED ORDER — MELOXICAM 7.5 MG PO TABS: 7.5000 mg | ORAL_TABLET | Freq: Every day | ORAL | 0 refills | Status: DC

## 2017-10-30 NOTE — Assessment & Plan Note (Signed)
BP elevated off medication RX reflil has been sent, she was instructed to resume daily medication RTC in 2 weeks for F/U- repeat BP reading

## 2017-10-30 NOTE — Telephone Encounter (Signed)
Referral placed during pt OV today

## 2017-10-30 NOTE — Patient Instructions (Addendum)
Please pick up and resume your blood pressure medication  Please head downstairs for knee xray today. I have sent a prescription for mobic 7.5 once daily for 2 weeks, do not combine this with other anti-inflammatory medications Please start back exercises provided Please schedule a follow up appointment for further evaluation here with Dr Tamala Julian or Dr Raeford Razor, our sports medicine providers  Please call insurance and ask for jardiance substitute, let me know so I can send the new prescription  I have placed referral to cardiology.  Please come back to see me in about 2 weeks so I can recheck your blood pressure and see how your are doing.   Back Exercises If you have pain in your back, do these exercises 2-3 times each day or as told by your doctor. When the pain goes away, do the exercises once each day, but repeat the steps more times for each exercise (do more repetitions). If you do not have pain in your back, do these exercises once each day or as told by your doctor. Exercises Single Knee to Chest  Do these steps 3-5 times in a row for each leg: 1. Lie on your back on a firm bed or the floor with your legs stretched out. 2. Bring one knee to your chest. 3. Hold your knee to your chest by grabbing your knee or thigh. 4. Pull on your knee until you feel a gentle stretch in your lower back. 5. Keep doing the stretch for 10-30 seconds. 6. Slowly let go of your leg and straighten it.  Pelvic Tilt  Do these steps 5-10 times in a row: 1. Lie on your back on a firm bed or the floor with your legs stretched out. 2. Bend your knees so they point up to the ceiling. Your feet should be flat on the floor. 3. Tighten your lower belly (abdomen) muscles to press your lower back against the floor. This will make your tailbone point up to the ceiling instead of pointing down to your feet or the floor. 4. Stay in this position for 5-10 seconds while you gently tighten your muscles and breathe  evenly.  Cat-Cow  Do these steps until your lower back bends more easily: 1. Get on your hands and knees on a firm surface. Keep your hands under your shoulders, and keep your knees under your hips. You may put padding under your knees. 2. Let your head hang down, and make your tailbone point down to the floor so your lower back is round like the back of a cat. 3. Stay in this position for 5 seconds. 4. Slowly lift your head and make your tailbone point up to the ceiling so your back hangs low (sags) like the back of a cow. 5. Stay in this position for 5 seconds.  Press-Ups  Do these steps 5-10 times in a row: 1. Lie on your belly (face-down) on the floor. 2. Place your hands near your head, about shoulder-width apart. 3. While you keep your back relaxed and keep your hips on the floor, slowly straighten your arms to raise the top half of your body and lift your shoulders. Do not use your back muscles. To make yourself more comfortable, you may change where you place your hands. 4. Stay in this position for 5 seconds. 5. Slowly return to lying flat on the floor.  Bridges  Do these steps 10 times in a row: 1. Lie on your back on a firm surface. 2. [place]  your knees so they point up to the ceiling. Your feet should be flat on the floor. 3. Tighten your butt muscles and lift your butt off of the floor until your waist is almost as high as your knees. If you do not feel the muscles working in your butt and the back of your thighs, slide your feet 1-2 inches farther away from your butt. 4. Stay in this position for 3-5 seconds. 5. Slowly lower your butt to the floor, and let your butt muscles relax.  If this exercise is too easy, try doing it with your arms crossed over your chest. Belly Crunches  Do these steps 5-10 times in a row: 1. Lie on your back on a firm bed or the floor with your legs stretched out. 2. Bend your knees so they point up to the ceiling. Your feet should be flat on  the floor. 3. Cross your arms over your chest. 4. Tip your chin a little bit toward your chest but do not bend your neck. 5. Tighten your belly muscles and slowly raise your chest just enough to lift your shoulder blades a tiny bit off of the floor. 6. Slowly lower your chest and your head to the floor.  Back Lifts Do these steps 5-10 times in a row: 1. Lie on your belly (face-down) with your arms at your sides, and rest your forehead on the floor. 2. Tighten the muscles in your legs and your butt. 3. Slowly lift your chest off of the floor while you keep your hips on the floor. Keep the back of your head in line with the curve in your back. Look at the floor while you do this. 4. Stay in this position for 3-5 seconds. 5. Slowly lower your chest and your face to the floor.  Contact a doctor if:  Your back pain gets a lot worse when you do an exercise.  Your back pain does not lessen 2 hours after you exercise. If you have any of these problems, stop doing the exercises. Do not do them again unless your doctor says it is okay. Get help right away if:  You have sudden, very bad back pain. If this happens, stop doing the exercises. Do not do them again unless your doctor says it is okay. This information is not intended to replace advice given to you by your health care provider. Make sure you discuss any questions you have with your health care provider. Document Released: 03/09/2010 Document Revised: 07/13/2015 Document Reviewed: 03/31/2014 Elsevier Interactive Patient Education  [employer].

## 2017-10-30 NOTE — Assessment & Plan Note (Signed)
Glucose readings elevated off jardiance Instructed to call insurance company for formulary subsitite and notify office so new Rx can be sent

## 2017-10-30 NOTE — Progress Notes (Signed)
Name: Deanna Watson   MRN: [id number]    DOB: January 19, 1951   Date:10/30/2017       Progress Note  Subjective  Chief Complaint Follow up  HPI Deanna Watson is here today for follow up of chest pain, diabetes and right leg pain. I saw her in the office on 10/27/17, she was having active chest pain so I sent her immediately to the ER for evaluation, however she left before being seen by physician. She says today her chest pain is better. Her first troponin in ED was 0.02, BMET was normal aside from elevated glucose, CBC was normal, chest xray showed: 1. Cardiomegaly with mild pulmonary vascular congestion without frank pulmonary edema. 2. Aortic atherosclerosis.  Hypertension -maintained on lisinopril- HCTZ 20-12.5 daily Reports she is currently out of medication, ran out about 2 weeks ago and has not picked up refill from pharamcy  BP Readings from Last 3 Encounters:  10/30/17 (!) 160/84  10/27/17 (!) 163/85  10/27/17 (!) 150/88   Diabetes- maintained on metformin 500 BID and jardiance, however she ran out of jardiance in august and can not afford to refill, blood sugars have been 200-300 since, she called her insurance company and was told farxiga and invokana were not covered either Denies hypoglycemia, tremor, diaphoresis, polyuria, polydipsia, polyphagia.  Lab Results  Component Value Date   HGBA1C 6.9 (H) 08/13/2017   Leg pain- this is a new problem, began around middle of august, was seen at urgent care and diagnosed with sciatica, given a toradol and steroid injection which helped for some time but then the pain returned. She says her pain is not really in her lower back but more in her right leg and specifically her knee, aching pain, constant throughout the day. She has also tried tylenol with no improvement No weakness, falls, decreased sensation. She was told in the past she may eventually need a right knee replacement but she does not want to have surgery if she can avoid  it.  Patient Active Problem List   Diagnosis Date Noted  . Diabetic retinopathy ([place]) 06/12/2017  . Chronic cholecystitis with calculus 01/23/2017  . Peripheral neuropathy 12/11/2016  . Tubular adenoma of colon 10/15/2016  . Medicare annual wellness visit, subsequent 04/04/2016  . Routine adult health maintenance 04/04/2016  . Type 2 diabetes mellitus without complication, without long-term current use of insulin ([place]) 03/01/2016  . Essential hypertension 03/01/2016  . Mild episode of recurrent major depressive disorder ([place]) 03/01/2016  . Mixed hyperlipidemia 03/01/2016    Past Surgical History:  Procedure Laterality Date  . CARDIAC ELECTROPHYSIOLOGY [place] AND ABLATION  2009  . CERVICAL CONE BIOPSY    . COLONOSCOPY    . LAPAROSCOPIC CHOLECYSTECTOMY SINGLE SITE WITH INTRAOPERATIVE CHOLANGIOGRAM N/A 01/23/2017   Procedure: LAPAROSCOPIC CHOLECYSTECTOMY SINGLE SITE WITH INTRAOPERATIVE CHOLANGIOGRAM ERAS PATHWAY;  Surgeon: Michael Boston, MD;  Location: [facility];  Service: General;  Laterality: N/A;  . LIVER BIOPSY N/A 01/23/2017   Procedure: BIOPSY OF LIVER;  Surgeon: Michael Boston, MD;  Location: [facility];  Service: General;  Laterality: N/A;  . REPLACEMENT TOTAL KNEE Left 2005  . UPPER GI ENDOSCOPY      Family History  Problem Relation Age of Onset  . Arthritis Mother   . Heart disease Mother   . Stroke Mother   . Hypertension Mother   . Kidney disease Mother   . Diabetes Mother   . Alcohol abuse Father   . Epilepsy Father   . Heart disease Brother   .  Ovarian cancer Paternal Aunt   . Stomach cancer Paternal Uncle   . Breast cancer Maternal Aunt   . Stomach cancer Maternal Uncle   . Colon cancer Neg Hx     Social History   Socioeconomic History  . Marital status: Married    Spouse name: Not on file  . Number of children: 1  . Years of education: 66  . Highest education level: Not on file  Occupational History  . Occupation: Retired  Scientific laboratory technician  . Financial  resource strain: Not on file  . Food insecurity:    Worry: Not on file    Inability: Not on file  . Transportation needs:    Medical: Not on file    Non-medical: Not on file  Tobacco Use  . Smoking status: Never Smoker  . Smokeless tobacco: Never Used  Substance and Sexual Activity  . Alcohol use: No  . Drug use: No  . Sexual activity: Not on file  Lifestyle  . Physical activity:    Days per week: Not on file    Minutes per session: Not on file  . Stress: Not on file  Relationships  . Social connections:    Talks on phone: Not on file    Gets together: Not on file    Attends religious service: Not on file    Active member of club or organization: Not on file    Attends meetings of clubs or organizations: Not on file    Relationship status: Not on file  . Intimate partner violence:    Fear of current or ex partner: Not on file    Emotionally abused: Not on file    Physically abused: Not on file    Forced sexual activity: Not on file  Other Topics Concern  . Not on file  Social History Narrative   Denies abuse and feels safe at home.      Current Outpatient Medications:  .  acetaminophen (TYLENOL) 500 MG tablet, Take 1,000 mg by mouth 2 (two) times daily as needed for moderate pain or headache., Disp: , Rfl:  .  budesonide-formoterol (SYMBICORT) 160-4.5 MCG/ACT inhaler, Inhale 1 puff into the lungs 2 (two) times daily., Disp: , Rfl:  .  calcium carbonate (TUMS - DOSED IN MG ELEMENTAL CALCIUM) 500 MG chewable tablet, Chew 1-2 tablets by mouth daily as needed for indigestion or heartburn., Disp: , Rfl:  .  celecoxib (CELEBREX) 200 MG capsule, TAKE 1 CAPSULE EVERY DAY, Disp: 90 capsule, Rfl: 0 .  cetirizine (ZYRTEC) 10 MG tablet, Take 10 mg by mouth daily as needed for allergies., Disp: , Rfl:  .  Cholecalciferol (VITAMIN D3) 5000 units CAPS, Take 5,000 Units by mouth daily., Disp: , Rfl:  .  empagliflozin (JARDIANCE) 25 MG TABS tablet, Take 25 mg by mouth daily., Disp: 90  tablet, Rfl: 0 .  escitalopram (LEXAPRO) 20 MG tablet, Take 1 tablet (20 mg total) by mouth daily., Disp: 90 tablet, Rfl: 2 .  gabapentin (NEURONTIN) 300 MG capsule, Take 1 capsule (300 mg total) by mouth 3 (three) times daily., Disp: 270 capsule, Rfl: 0 .  glipiZIDE (GLUCOTROL XL) 2.5 MG 24 hr tablet, Take 2.5 mg by mouth daily., Disp: , Rfl:  .  glucose blood test strip, Use to check blood sugars 2 times daily. Dx Code- E11.41, Disp: 100 each, Rfl: 12 .  Ketotifen Fumarate (ITCHY EYE DROPS OP), Apply 1 drop to eye daily as needed (itching eyes)., Disp: , Rfl:  .  lisinopril-hydrochlorothiazide (PRINZIDE,ZESTORETIC) 20-12.5 MG tablet, Take 1 tablet by mouth daily., Disp: 90 tablet, Rfl: 1 .  lovastatin (MEVACOR) 10 MG tablet, Take 1 tablet (10 mg total) by mouth at bedtime., Disp: 90 tablet, Rfl: 2 .  Menthol (ICY HOT) 5 % PTCH, Apply 1 patch topically daily as needed (pain)., Disp: , Rfl:  .  metFORMIN (GLUCOPHAGE-XR) 500 MG 24 hr tablet, Take 1 tablet (500 mg total) by mouth 2 (two) times daily., Disp: 180 tablet, Rfl: 2 .  Multiple Vitamin (MULTIVITAMIN) tablet, Take 1 tablet by mouth daily., Disp: , Rfl:  .  omeprazole (PRILOSEC) 20 MG capsule, Take 2 capsules (40 mg total) by mouth daily., Disp: 30 capsule, Rfl: 0 .  traMADol (ULTRAM) 50 MG tablet, Take 1-2 tablets (50-100 mg total) by mouth every 6 (six) hours as needed for moderate pain or severe pain., Disp: 30 tablet, Rfl: 0 .  traMADol (ULTRAM) 50 MG tablet, Take 1 tablet (50 mg total) by mouth every 6 (six) hours as needed., Disp: 15 tablet, Rfl: 0  Current Facility-Administered Medications:  .  0.9 %  sodium chloride infusion, 500 mL, Intravenous, Continuous, Armbruster, Carlota Raspberry, MD  No Known Allergies   ROS See HPI  Objective  Vitals:   10/30/17 0831  BP: (!) 160/84  Pulse: 77  SpO2: 97%  Weight: 168 lb (76.2 kg)  Height: 5' (1.524 m)    Body mass index is 32.81 kg/m.  Physical Exam Vital signs  reviewed. Constitutional: Patient appears well-developed and well-nourished. No distress.  HENT: Head: Normocephalic and atraumatic. Nose: Nose normal. Mouth/Throat: Oropharynx is clear and moist. No oropharyngeal exudate.  Eyes: Conjunctivae and EOM are normal. Pupils are equal, round, and reactive to light. No scleral icterus.  Neck: Normal range of motion. Neck supple. Cardiovascular: Normal rate, irregular rhythm and normal heart sounds.  No BLE edema. Distal pulses intact. Pulmonary/Chest: Effort normal and breath sounds normal. No respiratory distress. Neurological: She is alert and oriented to person, place, and time. No cranial nerve deficit. Coordination, balance, strength, speech and gait are normal.  Skin: Skin is warm and dry. No rash noted. No erythema.  Psychiatric: Patient has a normal mood and affect. behavior is normal. Judgment and thought content normal.   Assessment & Plan RTC in 2 weeks for F/u: HTN-resuming medication   Tightness in chest Improved, will place referral to cardiology for further evaluation  Discussed red flags and return precautions including when to seek emergency care - Ambulatory referral to Cardiology  Pain of right lower extremity Knee xray today Course of mobic-dosing and side effects discussed Also discussed home management of lower back pain including daily exercises and return precautions and provided additional information on AVS She was instructed to schedule follow up appointment with sports med providers here in our clinic - meloxicam (MOBIC) 7.5 MG tablet; Take 1 tablet (7.5 mg total) by mouth daily.  Dispense: 14 tablet; Refill: 0 - DG Knee Complete 4 Views Right; Future

## 2017-10-31 ENCOUNTER — Ambulatory Visit: Payer: Self-pay

## 2017-10-31 ENCOUNTER — Ambulatory Visit (INDEPENDENT_AMBULATORY_CARE_PROVIDER_SITE_OTHER): Payer: Medicare HMO | Admitting: Family Medicine

## 2017-10-31 ENCOUNTER — Encounter: Payer: Self-pay | Admitting: Family Medicine

## 2017-10-31 VITALS — BP 148/87 | HR 78 | Ht 60.0 in | Wt 165.0 lb

## 2017-10-31 DIAGNOSIS — M25561 Pain in right knee: Secondary | ICD-10-CM

## 2017-10-31 DIAGNOSIS — I724 Aneurysm of artery of lower extremity: Secondary | ICD-10-CM

## 2017-10-31 NOTE — Patient Instructions (Signed)
Nice to meet you  Please try ice on the knee  Take tylenol 650 mg three times a day is the best evidence based medicine we have for arthritis.  Glucosamine sulfate 750mg  twice a day is a supplement that has been shown to help moderate to severe arthritis. Vitamin D 2000 IU daily Fish oil 2 grams daily.  Tumeric 500mg  twice daily.  Capsaicin topically up to four times a day may also help with pain. Cortisone injections are an option if these interventions do not seem to make a difference or need more relief.  If cortisone injections do not help, there are different types of shots that may help but they take longer to take effect.  We can discuss this at follow up.  Please see me back in 3-4 weeks if your pain doesn't improve

## 2017-10-31 NOTE — Progress Notes (Signed)
Deanna Watson - 67 y.o. female MRN [id number]  Date of birth: [birth year]  SUBJECTIVE:  Including CC & ROS.  Chief Complaint  Patient presents with  . Knee Pain    Deanna Watson is a 67 y.o. female that is presenting with right knee pain. Ongoing for two weeks. Pain is located at her medial aspect and radiates down the anterior aspect of her lower leg. She recalls the pain beginning after she drove for a long period to [place]. Flexion triggers severe pain. Admits to swelling. She has been applying ice. Denies surgeries. Denies injury or trauma.  Denies any locking or giving way.  She has a history of left knee replacement.  She went to urgent care on 10/24/17 received a IM prednisone injection and Toradol.   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   Review of Systems  Constitutional: Negative for fever.  HENT: Negative for congestion.   Respiratory: Negative for cough.   Cardiovascular: Negative for chest pain.  Gastrointestinal: Negative for abdominal pain.  Musculoskeletal: Positive for arthralgias.  Neurological: Negative for weakness.  Hematological: Negative for adenopathy.  Psychiatric/Behavioral: Negative for agitation.    HISTORY: Past Medical, Surgical, Social, and Family History Reviewed & Updated per EMR.   Pertinent Historical Findings include:  Past Medical History:  Diagnosis Date  . Allergy   . Anemia    history of  . Arthritis   . Asthma   . Bell's palsy    lasted only about 2 days  . Chicken pox   . Chronic bronchitis ([place])   . COPD (chronic obstructive pulmonary disease) ([place])   . Depression   . Diabetes mellitus without complication ([place])   . Fatty liver    mild  . Fibromyalgia   . GERD (gastroesophageal reflux disease)   . Heart murmur   . History of blood transfusion   . Hyperlipidemia   . Hypertension   . Irregular heart beats   . Mild cardiomegaly   . Peripheral  neuropathy   . PONV (postoperative nausea and vomiting)   . Pulmonary vascular congestion    mild  . Sciatica   . WPW (Wolff-Parkinson-White syndrome)     Past Surgical History:  Procedure Laterality Date  . CARDIAC ELECTROPHYSIOLOGY [place] AND ABLATION  2009  . CERVICAL CONE BIOPSY    . COLONOSCOPY    . LAPAROSCOPIC CHOLECYSTECTOMY SINGLE SITE WITH INTRAOPERATIVE CHOLANGIOGRAM N/A 01/23/2017   Procedure: LAPAROSCOPIC CHOLECYSTECTOMY SINGLE SITE WITH INTRAOPERATIVE CHOLANGIOGRAM ERAS PATHWAY;  Surgeon: Michael Boston, MD;  Location: [facility];  Service: General;  Laterality: N/A;  . LIVER BIOPSY N/A 01/23/2017   Procedure: BIOPSY OF LIVER;  Surgeon: Michael Boston, MD;  Location: [facility];  Service: General;  Laterality: N/A;  . REPLACEMENT TOTAL KNEE Left 2005  . UPPER GI ENDOSCOPY      No Known Allergies  Family History  Problem Relation Age of Onset  . Arthritis Mother   . Heart disease Mother   . Stroke Mother   . Hypertension Mother   . Kidney disease Mother   . Diabetes Mother   . Alcohol abuse Father   . Epilepsy Father   . Heart disease Brother   . Ovarian cancer Paternal Aunt   . Stomach cancer Paternal Uncle   . Breast cancer Maternal Aunt   . Stomach cancer Maternal Uncle   . Colon cancer Neg Hx      Social History   Socioeconomic History  .  Marital status: Married    Spouse name: Not on file  . Number of children: 1  . Years of education: 70  . Highest education level: Not on file  Occupational History  . Occupation: Retired  Scientific laboratory technician  . Financial resource strain: Not on file  . Food insecurity:    Worry: Not on file    Inability: Not on file  . Transportation needs:    Medical: Not on file    Non-medical: Not on file  Tobacco Use  . Smoking status: Never Smoker  . Smokeless tobacco: Never Used  Substance and Sexual Activity  . Alcohol use: No  . Drug use: No  . Sexual activity: Not on file  Lifestyle  . Physical activity:    Days per week:  Not on file    Minutes per session: Not on file  . Stress: Not on file  Relationships  . Social connections:    Talks on phone: Not on file    Gets together: Not on file    Attends religious service: Not on file    Active member of club or organization: Not on file    Attends meetings of clubs or organizations: Not on file    Relationship status: Not on file  . Intimate partner violence:    Fear of current or ex partner: Not on file    Emotionally abused: Not on file    Physically abused: Not on file    Forced sexual activity: Not on file  Other Topics Concern  . Not on file  Social History Narrative   Denies abuse and feels safe at home.      PHYSICAL EXAM:  VS: BP (!) 148/87   Pulse 78   Ht 5' (1.524 m)   Wt 165 lb (74.8 kg)   SpO2 99%   BMI 32.22 kg/m  Physical Exam Gen: NAD, alert, cooperative with exam, well-appearing ENT: normal lips, normal nasal mucosa,  Eye: normal EOM, normal conjunctiva and lids CV:  no edema, +2 pedal pulses   Resp: no accessory muscle use, non-labored, ia  Skin: no rashes, no areas of induration  Neuro: normal tone, normal sensation to touch Psych:  normal insight, alert and oriented MSK:  Right Knee: Normal to inspection with no erythema or effusion or obvious bony abnormalities. Palpation normal with no warmth Tender to palpation over the medial joint line. ROM full in flexion and extension and lower leg rotation. Ligaments with solid consistent endpoints including  LCL, MCL. Pain with McMurray's testing. Pain with patellar compression. Patellar and quadriceps tendons unremarkable. Hamstring and quadriceps strength is normal.  Neurovascularly intact    Aspiration/Injection Procedure Note Deanna Watson [birth year]  Procedure: Injection Indications: Right knee pain.  Procedure Details Consent: Risks of procedure as well as the alternatives and risks of each were explained to the (patient/caregiver).  Consent for procedure  obtained. Time Out: Verified patient identification, verified procedure, site/side was marked, verified correct patient position, special equipment/implants available, medications/allergies/relevent history reviewed, required imaging and test results available.  Performed.  The area was cleaned with iodine and alcohol swabs.    The right knee superior lateral suprapatellar pouch was injected using 1 cc's of 40 mg Depomedrol and 4 cc's of 1% lidocaine with a 22 1 1/2" needle.  Ultrasound was used. Images were obtained in Long views showing the injection.    A sterile dressing was applied.  Patient did tolerate procedure well.        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  -Injection today -Counseled on supportive therapy. -If no improvement consider physical therapy

## 2017-11-02 DIAGNOSIS — M179 Osteoarthritis of knee, unspecified: Secondary | ICD-10-CM | POA: Insufficient documentation

## 2017-11-02 DIAGNOSIS — M1710 Unilateral primary osteoarthritis, unspecified knee: Secondary | ICD-10-CM | POA: Insufficient documentation

## 2017-11-02 DIAGNOSIS — I724 Aneurysm of artery of lower extremity: Secondary | ICD-10-CM | POA: Insufficient documentation

## 2017-11-02 NOTE — Assessment & Plan Note (Signed)
Found incidentally on the knee x-ray -Referral to vascular surgery

## 2017-11-02 NOTE — Assessment & Plan Note (Signed)
Pain seems to be consistent with degenerative changes as well as a component of meniscal irritation.  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  -Injection today -Counseled on supportive therapy. -If no improvement consider physical therapy

## 2017-11-03 ENCOUNTER — Other Ambulatory Visit: Payer: Self-pay

## 2017-11-03 DIAGNOSIS — I724 Aneurysm of artery of lower extremity: Secondary | ICD-10-CM

## 2017-11-05 ENCOUNTER — Encounter: Payer: Self-pay | Admitting: Nurse Practitioner

## 2017-11-05 ENCOUNTER — Ambulatory Visit (INDEPENDENT_AMBULATORY_CARE_PROVIDER_SITE_OTHER): Payer: Medicare HMO | Admitting: Nurse Practitioner

## 2017-11-05 ENCOUNTER — Ambulatory Visit: Payer: 59 | Admitting: Nurse Practitioner

## 2017-11-05 VITALS — BP 120/70 | HR 82 | Ht 60.0 in | Wt 164.0 lb

## 2017-11-05 DIAGNOSIS — M25561 Pain in right knee: Secondary | ICD-10-CM

## 2017-11-05 MED ORDER — KETOROLAC TROMETHAMINE 30 MG/ML IJ SOLN
30.0000 mg | Freq: Once | INTRAMUSCULAR | Status: AC
  Administered 2017-11-05: 30 mg via INTRAMUSCULAR

## 2017-11-05 MED ORDER — METHYLPREDNISOLONE ACETATE 40 MG/ML IJ SUSP
40.0000 mg | Freq: Once | INTRAMUSCULAR | Status: AC
  Administered 2017-11-05: 40 mg via INTRAMUSCULAR

## 2017-11-05 NOTE — Patient Instructions (Addendum)
We have given you injections for your pain today. Please schedule a follow up appointment with Dr Raeford Razor for Thursday or Friday.   Knee Pain, Adult Many things can cause knee pain. The pain often goes away on its own with time and rest. If the pain does not go away, tests may be done to find out what is causing the pain. Follow these instructions at home: Activity  Rest your knee.  Do not do things that cause pain.  Avoid activities where both feet leave the ground at the same time (high-impact activities). Examples are running, jumping rope, and doing jumping jacks. General instructions  Take medicines only as told by your doctor.  Raise (elevate) your knee when you are resting. Make sure your knee is higher than your heart.  Sleep with a pillow under your knee.  If told, put ice on the knee: ? Put ice in a plastic bag. ? Place a towel between your skin and the bag. ? Leave the ice on for 20 minutes, 2-3 times a day.  Ask your doctor if you should wear an elastic knee support.  Lose weight if you are overweight. Being overweight can make your knee hurt more.  Do not use any tobacco products. These include cigarettes, chewing tobacco, or electronic cigarettes. If you need help quitting, ask your doctor. Smoking may slow down healing. Contact a doctor if:  The pain does not stop.  The pain changes or gets worse.  You have a fever along with knee pain.  Your knee gives out or locks up.  Your knee swells, and becomes worse. Get help right away if:  Your knee feels warm.  You cannot move your knee.  You have very bad knee pain.  You have chest pain.  You have trouble breathing. Summary  Many things can cause knee pain. The pain often goes away on its own with time and rest.  Avoid activities that put stress on your knee. These include running and jumping rope.  Get help right away if you cannot move your knee, or if your knee feels warm, or if you have trouble  breathing. This information is not intended to replace advice given to you by your health care provider. Make sure you discuss any questions you have with your health care provider. Document Released: 05/03/2008 Document Revised: 01/30/2016 Document Reviewed: 01/30/2016 Elsevier Interactive Patient Education  2017 [employer].

## 2017-11-05 NOTE — Progress Notes (Signed)
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 The pain was greatly improved for about 2 days after the injection, then this past Monday she was walking and felt her leg give out and the pain has been severe again since. She describes as constant throbbing in her right knee, worse with ambulation, driving. She has tried the tramadol she was prescribed and it helps the pain some but she does not like the way it makes her feel. She is taking vitamin D but has not picked up other supplements that were instructed by SM.    Patient Active Problem List   Diagnosis Date Noted  . Right knee pain 11/02/2017  . 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 Diabetic retinopathy ([place]) 06/12/2017  . Chronic cholecystitis with calculus 01/23/2017  . Peripheral neuropathy 12/11/2016  . Tubular adenoma of colon 10/15/2016  . Medicare annual wellness visit, subsequent 04/04/2016  . Routine adult health maintenance 04/04/2016  . Type 2 diabetes mellitus without complication, without long-term current use of insulin ([place]) 03/01/2016  . Essential hypertension 03/01/2016  . Mild episode of recurrent major depressive disorder ([place]) 03/01/2016  . Mixed hyperlipidemia 03/01/2016    Social History   Tobacco Use  . Smoking status: Never Smoker  . Smokeless tobacco: Never Used  Substance Use Topics  . Alcohol use: No     Current Outpatient Medications:  .  acetaminophen (TYLENOL) 500 MG tablet, Take 1,000 mg by mouth 2 (two) times daily as needed for moderate pain or  headache., Disp: , Rfl:  .  budesonide-formoterol (SYMBICORT) 160-4.5 MCG/ACT inhaler, Inhale 1 puff into the lungs 2 (two) times daily., Disp: , Rfl:  .  calcium carbonate (TUMS - DOSED IN MG ELEMENTAL CALCIUM) 500 MG chewable tablet, Chew 1-2 tablets by mouth daily as needed for indigestion or heartburn., Disp: , Rfl:  .  celecoxib (CELEBREX) 200 MG capsule, TAKE 1 CAPSULE EVERY DAY, Disp: 90 capsule, Rfl: 0 .  cetirizine (ZYRTEC) 10 MG tablet, Take 10 mg by mouth daily as needed for allergies., Disp: , Rfl:  .  Cholecalciferol (VITAMIN D3) 5000 units CAPS, Take 5,000 Units by mouth daily., Disp: , Rfl:  .  empagliflozin (JARDIANCE) 25 MG TABS tablet, Take 25 mg by mouth daily., Disp: 90 tablet, Rfl: 0 .  escitalopram (LEXAPRO) 20 MG tablet, Take 1 tablet (20 mg total) by mouth daily., Disp: 90 tablet, Rfl: 2 .  gabapentin (NEURONTIN) 300 MG capsule, Take 1 capsule (300 mg total) by mouth 3 (three) times daily., Disp: 270 capsule, Rfl: 0 .  glucose blood test strip, Use to check blood sugars 2 times daily. Dx Code- E11.41, Disp: 100 each, Rfl: 12 .  Ketotifen Fumarate (ITCHY EYE DROPS OP), Apply 1 drop to eye daily as needed (itching eyes)., Disp: , Rfl:  .  lisinopril-hydrochlorothiazide (PRINZIDE,ZESTORETIC) 20-12.5 MG tablet, Take 1 tablet by mouth daily., Disp: 90 tablet, Rfl: 1 .  lovastatin (MEVACOR) 10 MG tablet, Take 1 tablet (10 mg total) by mouth at bedtime., Disp: 90 tablet, Rfl: 2 .  meloxicam (MOBIC) 7.5 MG tablet, Take 1 tablet (7.5 mg total) by mouth daily., Disp: 14 tablet, Rfl: 0 .  Menthol (ICY HOT) 5 % PTCH, Apply 1 patch topically daily as needed (pain)., Disp: , Rfl:  .  metFORMIN (GLUCOPHAGE-XR) 500 MG 24 hr tablet, Take 1 tablet (500 mg total) by mouth 2 (two) times daily., Disp: 180 tablet, Rfl: 2 .  Multiple Vitamin (MULTIVITAMIN) tablet, Take 1 tablet by mouth daily., Disp: , Rfl:  .  omeprazole (PRILOSEC) 20 MG capsule, Take 2 capsules (40 mg total) by mouth daily.,  Disp: 30 capsule, Rfl: 0 .  traMADol (ULTRAM) 50 MG tablet, Take 1 tablet (50 mg total) by mouth every 6 (six) hours as needed., Disp: 15 tablet, Rfl: 0  Current Facility-Administered Medications:  .  0.9 %  sodium chloride infusion, 500 mL, Intravenous, Continuous, Armbruster, Carlota Raspberry, MD  No Known Allergies  ROS  No other specific complaints in a complete review of systems (except as listed in HPI above).  Objective  Vitals:   11/05/17 1435  BP: 120/70  Pulse: 82  SpO2: 95%  Weight: 164 lb (74.4 kg)  Height: 5' (1.524 m)    Body mass index is 32.03 kg/m.  Nursing Note and Vital Signs reviewed.  Physical Exam  Constitutional: Patient appears well-developed and well-nourished. No distress.  HEENT: head atraumatic, normocephalic, pupils equal and reactive to light, EOM's intact,neck supple without lymphadenopathy, oropharynx pink and moist without exudate Cardiovascular: Normal rate, regular rhythm, distal pulses intact Pulmonary/Chest: Effort normal, No respiratory distress or retractions. Musculoskeletal: Normal range of motion, No gross deformities. Right knee is without erythema, effusion, warmth; tenderness to medial knee. Neurological: [name] is alert and oriented to person, place, and time. No cranial nerve deficit. Coordination, balance, strength, speech  are normal. Ambulatory with cane Skin: Skin is warm and dry. No rash noted. No erythema.  Psychiatric: Patient has a normal mood and affect. behavior is normal. Judgment and thought content normal.   Assessment & Plan  Right knee pain, unspecified chronicity Will treat with depomedrol and toradol for acute pain today and have her F/U back up with [facility] provider this week -Red flags and when to present for emergency care or RTC including fever >101.92F, chest pain, shortness of breath, new/worsening/un-resolving symptoms, reviewed and provided in AVS. - methylPREDNISolone acetate (DEPO-MEDROL) injection 40 mg -  ketorolac (TORADOL) 30 MG/ML injection 30 mg

## 2017-11-07 ENCOUNTER — Encounter: Payer: Self-pay | Admitting: Family Medicine

## 2017-11-07 ENCOUNTER — Ambulatory Visit (INDEPENDENT_AMBULATORY_CARE_PROVIDER_SITE_OTHER): Payer: Medicare HMO | Admitting: Family Medicine

## 2017-11-07 DIAGNOSIS — M25561 Pain in right knee: Secondary | ICD-10-CM | POA: Diagnosis not present

## 2017-11-07 DIAGNOSIS — G8929 Other chronic pain: Secondary | ICD-10-CM

## 2017-11-07 NOTE — Progress Notes (Signed)
Deanna Watson - 67 y.o. female MRN [id number]  Date of birth: [birth year]  SUBJECTIVE:  Including CC & ROS.  Chief Complaint  Patient presents with  . Follow-up    Deanna Watson is a 67 y.o. female that is here today for right knee pain follow up. Pain has not improved. She received an intra-articular injection on 10/31/17 with improvement for a couple of days. She was in a lot of pain and was seen on 11/05/17, she received a Toradol and depo intramuscular injection with no improvement in her pain.  She states the pain is located upper right thigh and radiates down her leg and knee.  Pain is described as stabbing. Admits to difficulty walking.    Review of Systems  Constitutional: Negative for fever.  HENT: Negative for congestion.   Respiratory: Negative for cough.   Cardiovascular: Negative for chest pain.  Gastrointestinal: Negative for abdominal pain.  Musculoskeletal: Positive for arthralgias and gait problem.  Skin: Negative for color change.  Neurological: Negative for weakness.  Hematological: Negative for adenopathy.  Psychiatric/Behavioral: Negative for agitation.    HISTORY: Past Medical, Surgical, Social, and Family History Reviewed & Updated per EMR.   Pertinent Historical Findings include:  Past Medical History:  Diagnosis Date  . Allergy   . Anemia    history of  . Arthritis   . Asthma   . Bell's palsy    lasted only about 2 days  . Chicken pox   . Chronic bronchitis ([place])   . COPD (chronic obstructive pulmonary disease) ([place])   . Depression   . Diabetes mellitus without complication ([place])   . Fatty liver    mild  . Fibromyalgia   . GERD (gastroesophageal reflux disease)   . Heart murmur   . History of blood transfusion   . Hyperlipidemia   . Hypertension   . Irregular heart beats   . Mild cardiomegaly   . Peripheral neuropathy   . PONV (postoperative nausea and vomiting)   . Pulmonary vascular congestion    mild  . Sciatica   . WPW  (Wolff-Parkinson-White syndrome)     Past Surgical History:  Procedure Laterality Date  . CARDIAC ELECTROPHYSIOLOGY [place] AND ABLATION  2009  . CERVICAL [place] BIOPSY    . COLONOSCOPY    . LAPAROSCOPIC CHOLECYSTECTOMY SINGLE SITE WITH INTRAOPERATIVE CHOLANGIOGRAM N/A 01/23/2017   Procedure: LAPAROSCOPIC CHOLECYSTECTOMY SINGLE SITE WITH INTRAOPERATIVE CHOLANGIOGRAM ERAS PATHWAY;  Surgeon: Michael Boston, MD;  Location: [facility];  Service: General;  Laterality: N/A;  . LIVER BIOPSY N/A 01/23/2017   Procedure: BIOPSY OF LIVER;  Surgeon: Michael Boston, MD;  Location: [facility];  Service: General;  Laterality: N/A;  . REPLACEMENT TOTAL KNEE Left 2005  . UPPER GI ENDOSCOPY      No Known Allergies  Family History  Problem Relation Age of Onset  . Arthritis Mother   . Heart disease Mother   . Stroke Mother   . Hypertension Mother   . Kidney disease Mother   . Diabetes Mother   . Alcohol abuse Father   . Epilepsy Father   . Heart disease Brother   . Ovarian cancer Paternal Aunt   . Stomach cancer Paternal Uncle   . Breast cancer Maternal Aunt   . Stomach cancer Maternal Uncle   . Colon cancer Neg Hx      Social History   Socioeconomic History  . Marital status: Married    Spouse name: Not on file  . Number of children: 1  .  Years of education: 36  . Highest education level: Not on file  Occupational History  . Occupation: Retired  Scientific laboratory technician  . Financial resource strain: Not on file  . Food insecurity:    Worry: Not on file    Inability: Not on file  . Transportation needs:    Medical: Not on file    Non-medical: Not on file  Tobacco Use  . Smoking status: Never Smoker  . Smokeless tobacco: Never Used  Substance and Sexual Activity  . Alcohol use: No  . Drug use: No  . Sexual activity: Not on file  Lifestyle  . Physical activity:    Days per week: Not on file    Minutes per session: Not on file  . Stress: Not on file  Relationships  . Social connections:    Talks  on phone: Not on file    Gets together: Not on file    Attends religious service: Not on file    Active member of club or organization: Not on file    Attends meetings of clubs or organizations: Not on file    Relationship status: Not on file  . Intimate partner violence:    Fear of current or ex partner: Not on file    Emotionally abused: Not on file    Physically abused: Not on file    Forced sexual activity: Not on file  Other Topics Concern  . Not on file  Social History Narrative   Denies abuse and feels safe at home.      PHYSICAL EXAM:  VS: BP 132/66   Pulse (!) 56   Ht 5' (1.524 m)   Wt 164 lb (74.4 kg)   SpO2 98%   BMI 32.03 kg/m  Physical Exam Gen: NAD, alert, cooperative with exam, well-appearing ENT: normal lips, normal nasal mucosa,  Eye: normal EOM, normal conjunctiva and lids CV:  no edema, +2 pedal pulses   Resp: no accessory muscle use, non-labored,  Skin: no rashes, no areas of induration  Neuro: normal tone, normal sensation to touch Psych:  normal insight, alert and oriented MSK:  Right knee:  No effusion. Tenderness palpation over the medial lateral joint line. Normal flexion and extension. Normal strength resistance. Normal internal and external rotation of the right hip. Negative straight leg raise. Negative McMurray's test. Neurovascular intact     ASSESSMENT & PLAN:   Right knee pain Was pain-free for a few days with the steroid injection.  Pain is returned. -We will try for Visco supplementation. -Counseled on home exercise therapy. -If no improvement may need to consider physical therapy.

## 2017-11-07 NOTE — Patient Instructions (Signed)
GOod to see you  Please try the exercises  Please try ice and compression  Please try the naproxen and tylenol  Take tylenol 650 mg three times a day is the best evidence based medicine we have for arthritis.  Glucosamine sulfate 750mg  twice a day is a supplement that has been shown to help moderate to severe arthritis. Vitamin D 2000 IU daily Fish oil 2 grams daily.  Tumeric 500mg  twice daily.  Capsaicin topically up to four times a day may also help with pain. We will call you about the gel injections.

## 2017-11-09 NOTE — Assessment & Plan Note (Signed)
Was pain-free for a few days with the steroid injection.  Pain is returned. -We will try for Visco supplementation. -Counseled on home exercise therapy. -If no improvement may need to consider physical therapy.

## 2017-11-11 ENCOUNTER — Other Ambulatory Visit: Payer: Self-pay | Admitting: Nurse Practitioner

## 2017-11-11 DIAGNOSIS — F330 Major depressive disorder, recurrent, mild: Secondary | ICD-10-CM

## 2017-11-13 ENCOUNTER — Ambulatory Visit: Payer: Medicare HMO | Admitting: Nurse Practitioner

## 2017-11-21 ENCOUNTER — Ambulatory Visit: Payer: Medicare HMO | Admitting: Family Medicine

## 2017-11-21 ENCOUNTER — Telehealth: Payer: Self-pay | Admitting: Nurse Practitioner

## 2017-11-21 NOTE — Telephone Encounter (Signed)
Copied from [place] [id number]. Topic: Inquiry >> Nov 21, 2017  1:23 PM Oliver Pila B wrote: Reason for CRM: pt called to ask if Dr. Raeford Razor has received approval for the injections from [facility] yet; contact to advise

## 2017-11-22 ENCOUNTER — Other Ambulatory Visit: Payer: Self-pay

## 2017-11-22 ENCOUNTER — Encounter (HOSPITAL_COMMUNITY): Payer: Self-pay | Admitting: Emergency Medicine

## 2017-11-22 ENCOUNTER — Ambulatory Visit (HOSPITAL_COMMUNITY)
Admission: EM | Admit: 2017-11-22 | Discharge: 2017-11-22 | Disposition: A | Discharge status: Home / Self Care | Payer: Medicare HMO | Attending: Family Medicine | Admitting: Family Medicine

## 2017-11-22 DIAGNOSIS — G8929 Other chronic pain: Secondary | ICD-10-CM

## 2017-11-22 DIAGNOSIS — M25561 Pain in right knee: Secondary | ICD-10-CM

## 2017-11-22 MED ORDER — DICLOFENAC SODIUM 1 % TD GEL: 4.0000 g | Freq: Four times a day (QID) | TRANSDERMAL | 0 refills | Status: DC

## 2017-11-22 MED ORDER — KETOROLAC TROMETHAMINE 60 MG/2ML IM SOLN
INTRAMUSCULAR | Status: AC
  Filled 2017-11-22: qty 2

## 2017-11-22 MED ORDER — KETOROLAC TROMETHAMINE 60 MG/2ML IM SOLN
60.0000 mg | Freq: Once | INTRAMUSCULAR | Status: AC
  Administered 2017-11-22: 60 mg via INTRAMUSCULAR

## 2017-11-22 NOTE — ED Provider Notes (Signed)
  [place]    CSN: [id number] Arrival date & time: 11/22/17  1422  Musculoskeletal Exam  Patient: Deanna Watson DOB: 1950/10/12  DOS: 11/22/2017  SUBJECTIVE:  Chief Complaint:   Chief Complaint  Patient presents with  . Knee Pain    Kathreen Dileo is a 67 y.o.  female for evaluation and treatment of R knee pain.   Onset:  2 months ago.  No inj or change in activity.  Character:  aching  Progression of issue:  is unchanged Associated symptoms: Swelling, decreased range of motion Treatment: to date has been joint steroid injections, IM steroid injections, Celebrex, ice, stretches/exercises, and a Toradol injection.   She is waiting on insurance approval for Visco supplementation. Neurovascular symptoms: no  ROS: Musculoskeletal/Extremities: +R knee pain  Past Medical History:  Diagnosis Date  . Allergy   . Anemia    history of  . Arthritis   . Asthma   . Bell's palsy    lasted only about 2 days  . Chicken pox   . Chronic bronchitis ([place])   . COPD (chronic obstructive pulmonary disease) ([employer])   . Depression   . Diabetes mellitus without complication ([place])   . Fatty liver    mild  . Fibromyalgia   . GERD (gastroesophageal reflux disease)   . Heart murmur   . History of blood transfusion   . Hyperlipidemia   . Hypertension   . Irregular heart beats   . Mild cardiomegaly   . Peripheral neuropathy   . PONV (postoperative nausea and vomiting)   . Pulmonary vascular congestion    mild  . Sciatica   . WPW (Wolff-Parkinson-White syndrome)     Objective: VITAL SIGNS: BP 139/70 (BP Location: Right Arm)   Pulse 69   Temp 98.2 F (36.8 C) (Oral)   Resp 16   SpO2 99%  Constitutional: Well formed, well developed. No acute distress. Thorax & Lungs: No accessory muscle use Musculoskeletal: Right knee.   Normal active range of motion: no.   Normal passive range of motion: yes Tenderness to palpation: no Deformity: no Ecchymosis: no Tests  positive: None Tests negative: Lachman's, McMurray's, Stines, patellar apprehension/grind, varus/valgus Neurologic: Normal sensory function.  Psychiatric: Normal mood. Age appropriate judgment and insight. Alert & oriented x 3.      Final Clinical Impressions(s) / UC Diagnoses   Final diagnoses:  Chronic pain of right knee   Patient requested Toradol injection today.  We will give this and she will follow-up with her current team this coming week.  We will try Voltaren gel over the knee as well.  Continue icing and use stretches/exercises.  The patient voiced understanding and agreement to the plan.   Discharge Instructions     Ice/cold pack over area for 10-15 min twice daily.  Try your best to do the stretches/exercises.  Follow up with Dr. Chauncey Cruel.     ED Prescriptions    Medication Sig Dispense Auth. Provider   diclofenac sodium (VOLTAREN) 1 % GEL Apply 4 g topically 4 (four) times daily. 1 Tube Shelda Pal, DO     Controlled Substance Prescriptions [place] Controlled Substance Registry consulted? Not Applicable   Shelda Pal, [place] 11/22/17 1324

## 2017-11-22 NOTE — ED Triage Notes (Signed)
The patient presented to the [facility] with a complaint of right knee pain x 2 months. The patient reported that she was seen in September here for the same and she received a "shot" and it helped. She reported that it started hurting again and she has been seen by her PCP and sports medicine for the same.

## 2017-11-22 NOTE — Discharge Instructions (Signed)
Ice/cold pack over area for 10-15 min twice daily.  Try your best to do the stretches/exercises.  Follow up with Dr. Chauncey Cruel.

## 2017-11-24 ENCOUNTER — Ambulatory Visit (INDEPENDENT_AMBULATORY_CARE_PROVIDER_SITE_OTHER): Payer: Medicare HMO | Admitting: Nurse Practitioner

## 2017-11-24 ENCOUNTER — Encounter: Payer: Self-pay | Admitting: Nurse Practitioner

## 2017-11-24 ENCOUNTER — Telehealth: Payer: Self-pay | Admitting: Family Medicine

## 2017-11-24 VITALS — BP 140/80 | HR 66 | Ht 60.0 in | Wt 168.0 lb

## 2017-11-24 DIAGNOSIS — I10 Essential (primary) hypertension: Secondary | ICD-10-CM

## 2017-11-24 DIAGNOSIS — Z23 Encounter for immunization: Secondary | ICD-10-CM

## 2017-11-24 DIAGNOSIS — G8929 Other chronic pain: Secondary | ICD-10-CM | POA: Diagnosis not present

## 2017-11-24 DIAGNOSIS — M25561 Pain in right knee: Secondary | ICD-10-CM | POA: Diagnosis not present

## 2017-11-24 DIAGNOSIS — E119 Type 2 diabetes mellitus without complications: Secondary | ICD-10-CM | POA: Diagnosis not present

## 2017-11-24 LAB — POCT GLYCOSYLATED HEMOGLOBIN (HGB A1C): Hemoglobin A1C: 7.2 % — AB (ref 4.0–5.6)

## 2017-11-24 MED ORDER — GLIMEPIRIDE 2 MG PO TABS: 2.0000 mg | ORAL_TABLET | Freq: Every day | ORAL | 1 refills | Status: DC

## 2017-11-24 NOTE — Telephone Encounter (Signed)
Left message on VM.

## 2017-11-24 NOTE — Patient Instructions (Addendum)
Start amaryl 2mg  once daily with breakfast. Continue metformin.  Please record your blood sugars in a log twice daily, once in the morning when you first wake up up before eating anything and then again 2 hours after dinner or before bedtime. Please follow up for readings <80, >200.   Diabetes Mellitus and Nutrition When you have diabetes (diabetes mellitus), it is very important to have healthy eating habits because your blood sugar (glucose) levels are greatly affected by what you eat and drink. Eating healthy foods in the appropriate amounts, at about the same times every day, can help you:  Control your blood glucose.  Lower your risk of heart disease.  Improve your blood pressure.  Reach or maintain a healthy weight.  Every person with diabetes is different, and each person has different needs for a meal plan. Your health care provider may recommend that you work with a diet and nutrition specialist (dietitian) to make a meal plan that is best for you. Your meal plan may vary depending on factors such as:  The calories you need.  The medicines you take.  Your weight.  Your blood glucose, blood pressure, and cholesterol levels.  Your activity level.  Other health conditions you have, such as heart or kidney disease.  How do carbohydrates affect me? Carbohydrates affect your blood glucose level more than any other type of food. Eating carbohydrates naturally increases the amount of glucose in your blood. Carbohydrate counting is a method for keeping track of how many carbohydrates you eat. Counting carbohydrates is important to keep your blood glucose at a healthy level, especially if you use insulin or take certain oral diabetes medicines. It is important to know how many carbohydrates you can safely have in each meal. This is different for every person. Your dietitian can help you calculate how many carbohydrates you should have at each meal and for snack. Foods that contain  carbohydrates include:  Bread, cereal, rice, pasta, and crackers.  Potatoes and corn.  Peas, beans, and lentils.  Milk and yogurt.  Fruit and juice.  Desserts, such as cakes, cookies, ice cream, and candy.  How does alcohol affect me? Alcohol can cause a sudden decrease in blood glucose (hypoglycemia), especially if you use insulin or take certain oral diabetes medicines. Hypoglycemia can be a life-threatening condition. Symptoms of hypoglycemia (sleepiness, dizziness, and confusion) are similar to symptoms of having too much alcohol. If your health care provider says that alcohol is safe for you, follow these guidelines:  Limit alcohol intake to no more than 1 drink per day for nonpregnant women and 2 drinks per day for men. One drink equals 12 oz of beer, 5 oz of wine, or 1 oz of hard liquor.  Do not drink on an empty stomach.  Keep yourself hydrated with water, diet soda, or unsweetened iced tea.  Keep in mind that regular soda, juice, and other mixers may contain a lot of sugar and must be counted as carbohydrates.  What are tips for following this plan? Reading food labels  Start by checking the serving size on the label. The amount of calories, carbohydrates, fats, and other nutrients listed on the label are based on one serving of the food. Many foods contain more than one serving per package.  Check the total grams (g) of carbohydrates in one serving. You can calculate the number of servings of carbohydrates in one serving by dividing the total carbohydrates by 15. For example, if a food has 30 g  of total carbohydrates, it would be equal to 2 servings of carbohydrates.  Check the number of grams (g) of saturated and trans fats in one serving. Choose foods that have low or no amount of these fats.  Check the number of milligrams (mg) of sodium in one serving. Most people should limit total sodium intake to less than 2,300 mg per day.  Always check the nutrition  information of foods labeled as "low-fat" or "nonfat". These foods may be higher in added sugar or refined carbohydrates and should be avoided.  Talk to your dietitian to identify your daily goals for nutrients listed on the label. Shopping  Avoid buying canned, premade, or processed foods. These foods tend to be high in fat, sodium, and added sugar.  Shop around the outside edge of the grocery store. This includes fresh fruits and vegetables, bulk grains, fresh meats, and fresh dairy. Cooking  Use low-heat cooking methods, such as baking, instead of high-heat cooking methods like deep frying.  Cook using healthy oils, such as olive, canola, or sunflower oil.  Avoid cooking with butter, cream, or high-fat meats. Meal planning  Eat meals and snacks regularly, preferably at the same times every day. Avoid going long periods of time without eating.  Eat foods high in fiber, such as fresh fruits, vegetables, beans, and whole grains. Talk to your dietitian about how many servings of carbohydrates you can eat at each meal.  Eat 4-6 ounces of lean protein each day, such as lean meat, chicken, fish, eggs, or tofu. 1 ounce is equal to 1 ounce of meat, chicken, or fish, 1 egg, or 1/4 cup of tofu.  Eat some foods each day that contain healthy fats, such as avocado, nuts, seeds, and fish. Lifestyle   Check your blood glucose regularly.  Exercise at least 30 minutes 5 or more days each week, or as told by your health care provider.  Take medicines as told by your health care provider.  Do not use any products that contain nicotine or tobacco, such as cigarettes and e-cigarettes. If you need help quitting, ask your health care provider.  Work with a Social worker or diabetes educator to identify strategies to manage stress and any emotional and social challenges. What are some questions to ask my health care provider?  Do I need to meet with a diabetes educator?  Do I need to meet with a  dietitian?  What number can I call if I have questions?  When are the best times to check my blood glucose? Where to find more information:  American Diabetes Association: diabetes.org/food-and-fitness/food  Academy of Nutrition and Dietetics: PokerClues.dk  [employer] of Diabetes and Digestive and Kidney Diseases (NIH): ContactWire.be Summary  A healthy meal plan will help you control your blood glucose and maintain a healthy lifestyle.  Working with a diet and nutrition specialist (dietitian) can help you make a meal plan that is best for you.  Keep in mind that carbohydrates and alcohol have immediate effects on your blood glucose levels. It is important to count carbohydrates and to use alcohol carefully. This information is not intended to replace advice given to you by your health care provider. Make sure you discuss any questions you have with your health care provider. Document Released: 11/01/2004 Document Revised: 03/11/2016 Document Reviewed: 03/11/2016 Elsevier Interactive Patient Education  [employer].

## 2017-11-24 NOTE — Assessment & Plan Note (Signed)
Stable Continue current  Medication Continue to monitor

## 2017-11-24 NOTE — Assessment & Plan Note (Signed)
Although her a1c is well controlled on jardiance, Due to inabilitiy to afford jardiance will switch to Amaryl - dosing and side effects discussed  Continue metformin Continue to monitor glucose at home Additional education provided on AVS RTC in 1 month for F/U - POCT HgB A1C - glimepiride (AMARYL) 2 MG tablet; Take 1 tablet (2 mg total) by mouth daily before breakfast.  Dispense: 30 tablet; Refill: 1

## 2017-11-24 NOTE — Telephone Encounter (Signed)
Patient would like to know the status of a PA started for a gel injection.

## 2017-11-24 NOTE — Progress Notes (Signed)
Deanna Watson is a 67 y.o. female with the following history as recorded in EpicCare:  Patient Active Problem List   Diagnosis Date Noted  . Right knee pain 11/02/2017  . 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 Diabetic retinopathy ([place]) 06/12/2017  . Chronic cholecystitis with calculus 01/23/2017  . Peripheral neuropathy 12/11/2016  . Tubular adenoma of colon 10/15/2016  . Medicare annual wellness visit, subsequent 04/04/2016  . Routine adult health maintenance 04/04/2016  . Type 2 diabetes mellitus without complication, without long-term current use of insulin ([place]) 03/01/2016  . Essential hypertension 03/01/2016  . Mild episode of recurrent major depressive disorder ([place]) 03/01/2016  . Mixed hyperlipidemia 03/01/2016    Current Outpatient Medications  Medication Sig Dispense Refill  . acetaminophen (TYLENOL) 500 MG tablet Take 1,000 mg by mouth 2 (two) times daily as needed for moderate pain or headache.    . budesonide-formoterol (SYMBICORT) 160-4.5 MCG/ACT inhaler Inhale 1 puff into the lungs 2 (two) times daily.    . calcium carbonate (TUMS - DOSED IN MG ELEMENTAL CALCIUM) 500 MG chewable tablet Chew 1-2 tablets by mouth daily as needed for indigestion or heartburn.    . celecoxib (CELEBREX) 200 MG capsule TAKE 1 CAPSULE EVERY DAY 90 capsule 0  . cetirizine (ZYRTEC) 10 MG tablet Take 10 mg by mouth daily as needed for allergies.    . Cholecalciferol (VITAMIN D3) 5000 units CAPS Take 5,000 Units by mouth daily.    . diclofenac sodium (VOLTAREN) 1 % GEL Apply 4 g topically 4 (four) times daily. 1 Tube 0  . empagliflozin (JARDIANCE) 25 MG TABS tablet Take 25 mg by mouth daily. 90 tablet 0  . escitalopram (LEXAPRO) 20 MG tablet TAKE 1 TABLET EVERY DAY 90 tablet 2  . gabapentin (NEURONTIN) 300 MG capsule Take 1 capsule (300 mg total) by mouth 3 (three) times daily. 270 capsule 0  . glucose blood test strip Use to check blood sugars 2 times daily. Dx Code- E11.41 100 each 12   . Ketotifen Fumarate (ITCHY EYE DROPS OP) Apply 1 drop to eye daily as needed (itching eyes).    Marland Kitchen lisinopril-hydrochlorothiazide (PRINZIDE,ZESTORETIC) 20-12.5 MG tablet Take 1 tablet by mouth daily. 90 tablet 1  . lovastatin (MEVACOR) 10 MG tablet Take 1 tablet (10 mg total) by mouth at bedtime. 90 tablet 2  . meloxicam (MOBIC) 7.5 MG tablet Take 1 tablet (7.5 mg total) by mouth daily. 14 tablet 0  . Menthol (ICY HOT) 5 % PTCH Apply 1 patch topically daily as needed (pain).    . metFORMIN (GLUCOPHAGE-XR) 500 MG 24 hr tablet Take 1 tablet (500 mg total) by mouth 2 (two) times daily. 180 tablet 2  . Multiple Vitamin (MULTIVITAMIN) tablet Take 1 tablet by mouth daily.    Marland Kitchen omeprazole (PRILOSEC) 20 MG capsule Take 2 capsules (40 mg total) by mouth daily. 30 capsule 0  . glimepiride (AMARYL) 2 MG tablet Take 1 tablet (2 mg total) by mouth daily before breakfast. 30 tablet 1   Current Facility-Administered Medications  Medication Dose Route Frequency Provider Last Rate Last Dose  . 0.9 %  sodium chloride infusion  500 mL Intravenous Continuous Armbruster, Carlota Raspberry, MD        Allergies: Patient has no known allergies.  Past Medical History:  Diagnosis Date  . Allergy   . Anemia    history of  . Arthritis   . Asthma   . Bell's palsy    lasted only about 2 days  .  Chicken pox   . Chronic bronchitis ([place])   . COPD (chronic obstructive pulmonary disease) ([place])   . Depression   . Diabetes mellitus without complication ([place])   . Fatty liver    mild  . Fibromyalgia   . GERD (gastroesophageal reflux disease)   . Heart murmur   . History of blood transfusion   . Hyperlipidemia   . Hypertension   . Irregular heart beats   . Mild cardiomegaly   . Peripheral neuropathy   . PONV (postoperative nausea and vomiting)   . Pulmonary vascular congestion    mild  . Sciatica   . WPW (Wolff-Parkinson-White syndrome)     Past Surgical History:  Procedure Laterality Date  . CARDIAC  ELECTROPHYSIOLOGY [place] AND ABLATION  2009  . CERVICAL CONE BIOPSY    . COLONOSCOPY    . LAPAROSCOPIC CHOLECYSTECTOMY SINGLE SITE WITH INTRAOPERATIVE CHOLANGIOGRAM N/A 01/23/2017   Procedure: LAPAROSCOPIC CHOLECYSTECTOMY SINGLE SITE WITH INTRAOPERATIVE CHOLANGIOGRAM ERAS PATHWAY;  Surgeon: Michael Boston, MD;  Location: [facility];  Service: General;  Laterality: N/A;  . LIVER BIOPSY N/A 01/23/2017   Procedure: BIOPSY OF LIVER;  Surgeon: Michael Boston, MD;  Location: [facility];  Service: General;  Laterality: N/A;  . REPLACEMENT TOTAL KNEE Left 2005  . UPPER GI ENDOSCOPY      Family History  Problem Relation Age of Onset  . Arthritis Mother   . Heart disease Mother   . Stroke Mother   . Hypertension Mother   . Kidney disease Mother   . Diabetes Mother   . Alcohol abuse Father   . Epilepsy Father   . Heart disease Brother   . Ovarian cancer Paternal Aunt   . Stomach cancer Paternal Uncle   . Breast cancer Maternal Aunt   . Stomach cancer Maternal Uncle   . Colon cancer Neg Hx     Social History   Tobacco Use  . Smoking status: Never Smoker  . Smokeless tobacco: Never Used  Substance Use Topics  . Alcohol use: No     Subjective:  Deanna Watson is here today for follow up of HTN, DM  Hypertension -maintained on lisinopril- HCTZ 20-12.5 daily, which she had run out of at her last OV on 10/30/17, refill was sent and she returns today for follow up Reports she has resumed medicine daily as instructed, readings have improved at home.  BP Readings from Last 3 Encounters:  11/24/17 140/80  11/22/17 139/70  11/07/17 132/66    Diabetes- maintained on metformin 500 BID and jardiance, but ran out of jardiance last week. She continues to have trouble filling jardiance due to cost, has called insurance company and was told the alternate options for jardiance but these are unaffordable to her as well, on high tier and she has limited income Reports recent glucose readings around 130s on  jardiance and she feels well on the medication. No tremor, diaphoresis, polyuria, polydipsia, polyphagia.    Lab Results  Component Value Date   HGBA1C 7.2 (A) 11/24/2017    No LMP recorded. Patient is postmenopausal.  ROS: See HPI  Objective:  Vitals:   11/24/17 1333  BP: 140/80  Pulse: 66  SpO2: 96%  Weight: 168 lb (76.2 kg)  Height: 5' (1.524 m)    General: Well developed, well nourished, in no acute distress  Skin : Warm and dry.  Head: Normocephalic and atraumatic  Eyes: Sclera and conjunctiva clear; pupils round and reactive to light; extraocular movements intact  Oropharynx: Pink, supple.  No suspicious lesions  Neck: Supple. Lungs: Respirations unlabored; clear to auscultation bilaterally without wheeze, rales, rhonchi  CVS exam: normal rate, regular rhythm, normal S1, S2, distal pulses intact Neurologic: Alert and oriented; speech intact; face symmetrical; moves all extremities well; CNII-XII intact without focal deficit    Assessment:  1. Type 2 diabetes mellitus without complication, without long-term current use of insulin ([place])   2. Need for influenza vaccination   3. Essential hypertension   4. Chronic pain of right knee     Plan:   Reviewed HM: Need for influenza vaccination- Flu vaccine HIGH DOSE PF  Return in about 1 month (around 12/25/2017). for F/U- DM- changing jardiance to metformin due to cost Orders Placed This Encounter  Procedures  . For home use only DME 4 wheeled rolling walker with seat ([id number])    Order Specific Question:   Patient needs a walker to treat with the following condition    Answer:   Knee pain [225018]  . Flu vaccine HIGH DOSE PF  . POCT HgB A1C    Requested Prescriptions   Signed Prescriptions Disp Refills  . glimepiride (AMARYL) 2 MG tablet 30 tablet 1    Sig: Take 1 tablet (2 mg total) by mouth daily before breakfast.

## 2017-11-24 NOTE — Assessment & Plan Note (Signed)
She is requesting walker for knee pain, which she has been following with SM for Walker Rx sent Continue follow up with SM as planned - For home use only DME 4 wheeled rolling walker with seat ([id number])

## 2017-11-25 NOTE — Telephone Encounter (Signed)
Spoke with patient-informed of Insurance approval. Will proceed with Orthovisc injections.

## 2017-11-28 ENCOUNTER — Ambulatory Visit: Payer: Medicare HMO | Admitting: Family Medicine

## 2017-11-28 ENCOUNTER — Ambulatory Visit (INDEPENDENT_AMBULATORY_CARE_PROVIDER_SITE_OTHER): Payer: Medicare HMO | Admitting: Family Medicine

## 2017-11-28 ENCOUNTER — Encounter: Payer: Self-pay | Admitting: Family Medicine

## 2017-11-28 ENCOUNTER — Other Ambulatory Visit: Payer: Medicare HMO

## 2017-11-28 VITALS — BP 134/78 | HR 79 | Ht 60.0 in

## 2017-11-28 DIAGNOSIS — M25461 Effusion, right knee: Secondary | ICD-10-CM

## 2017-11-28 DIAGNOSIS — M1711 Unilateral primary osteoarthritis, right knee: Secondary | ICD-10-CM

## 2017-11-28 NOTE — Patient Instructions (Signed)
Nice to see you  Please continue ice and compression on the knee  Please follow up in one week.

## 2017-11-28 NOTE — Progress Notes (Signed)
Deanna Watson - 66 y.o. female MRN [id number]  Date of birth: 08-Mar-1950  SUBJECTIVE:  Including CC & ROS.  Chief Complaint  Patient presents with  . Orthovisc    Deanna Watson is a 67 y.o. female that is here today for Orthovisc injection. She has been having mild to severe pain in her right knee.  Having pain with ambulation and feels that her knee has swollen more than usual.  Pain is localized to the medial aspect.  Pain is not improved with previous modalities.  Has received an injection within the past 3 months with limited improvement of her symptoms.  Independent review of the left knee x-ray from 9/12 shows a mild to moderate degenerative change of the medial compartment.  Aneurysm is also present.   Review of Systems  Constitutional: Negative for fever.  HENT: Negative for congestion.   Respiratory: Negative for cough.   Cardiovascular: Negative for chest pain.  Gastrointestinal: Negative for abdominal distention.  Musculoskeletal: Positive for arthralgias and gait problem.  Skin: Negative for color change.  Neurological: Negative for weakness.  Hematological: Negative for adenopathy.  Psychiatric/Behavioral: Negative for agitation.    HISTORY: Past Medical, Surgical, Social, and Family History Reviewed & Updated per EMR.   Pertinent Historical Findings include:  Past Medical History:  Diagnosis Date  . Allergy   . Anemia    history of  . Arthritis   . Asthma   . Bell's palsy    lasted only about 2 days  . Chicken pox   . Chronic bronchitis ([place])   . COPD (chronic obstructive pulmonary disease) ([place])   . Depression   . Diabetes mellitus without complication ([place])   . Fatty liver    mild  . Fibromyalgia   . GERD (gastroesophageal reflux disease)   . Heart murmur   . History of blood transfusion   . Hyperlipidemia   . Hypertension   . Irregular heart beats   . Mild cardiomegaly   . Peripheral neuropathy   . PONV (postoperative nausea and vomiting)     . Pulmonary vascular congestion    mild  . Sciatica   . WPW (Wolff-Parkinson-White syndrome)     Past Surgical History:  Procedure Laterality Date  . CARDIAC ELECTROPHYSIOLOGY [place] AND ABLATION  2009  . CERVICAL CONE BIOPSY    . COLONOSCOPY    . LAPAROSCOPIC CHOLECYSTECTOMY SINGLE SITE WITH INTRAOPERATIVE CHOLANGIOGRAM N/A 01/23/2017   Procedure: LAPAROSCOPIC CHOLECYSTECTOMY SINGLE SITE WITH INTRAOPERATIVE CHOLANGIOGRAM ERAS PATHWAY;  Surgeon: Michael Boston, MD;  Location[facility];  Service: General;  Laterality: N/A;  . LIVER BIOPSY N/A 01/23/2017   Procedure: BIOPSY OF LIVER;  Surgeon: Michael Boston, MD;  Location: [facility];  Service: General;  Laterality: N/A;  . REPLACEMENT TOTAL KNEE Left 2005  . UPPER GI ENDOSCOPY      No Known Allergies  Family History  Problem Relation Age of Onset  . Arthritis Mother   . Heart disease Mother   . Stroke Mother   . Hypertension Mother   . Kidney disease Mother   . Diabetes Mother   . Alcohol abuse Father   . Epilepsy Father   . Heart disease Brother   . Ovarian cancer Paternal Aunt   . Stomach cancer Paternal Uncle   . Breast cancer Maternal Aunt   . Stomach cancer Maternal Uncle   . Colon cancer Neg Hx      Social History   Socioeconomic History  . Marital status: Married    Spouse  name: Not on file  . Number of children: 1  . Years of education: 69  . Highest education level: Not on file  Occupational History  . Occupation: Retired  Scientific laboratory technician  . Financial resource strain: Not on file  . Food insecurity:    Worry: Not on file    Inability: Not on file  . Transportation needs:    Medical: Not on file    Non-medical: Not on file  Tobacco Use  . Smoking status: Never Smoker  . Smokeless tobacco: Never Used  Substance and Sexual Activity  . Alcohol use: No  . Drug use: No  . Sexual activity: Not on file  Lifestyle  . Physical activity:    Days per week: Not on file    Minutes per session: Not on file  .  Stress: Not on file  Relationships  . Social connections:    Talks on phone: Not on file    Gets together: Not on file    Attends religious service: Not on file    Active member of club or organization: Not on file    Attends meetings of clubs or organizations: Not on file    Relationship status: Not on file  . Intimate partner violence:    Fear of current or ex partner: Not on file    Emotionally abused: Not on file    Physically abused: Not on file    Forced sexual activity: Not on file  Other Topics Concern  . Not on file  Social History Narrative   Denies abuse and feels safe at home.      PHYSICAL EXAM:  VS: BP 134/78   Pulse 79   Ht 5' (1.524 m)   SpO2 99%   BMI 32.81 kg/m  Physical Exam Gen: NAD, alert, cooperative with exam, well-appearing ENT: normal lips, normal nasal mucosa,  Eye: normal EOM, normal conjunctiva and lids CV:  no edema, +2 pedal pulses   Resp: no accessory muscle use, non-labored,  Skin: no rashes, no areas of induration  Neuro: normal tone, normal sensation to touch Psych:  normal insight, alert and oriented MSK:  Right Knee: Normal to inspection with no erythema  Mild to moderate effusion. Palpation normal with no warmth, Tenderness palpation of the medial joint line. Good flexion and normal extension Patellar and quadriceps tendons unremarkable. Hamstring and quadriceps strength is normal.    Aspiration/Injection Procedure Note Deanna Watson April 17, 1950  Procedure: Aspiration and Injection Indications: right knee pain   Procedure Details Consent: Risks of procedure as well as the alternatives and risks of each were explained to the (patient/caregiver).  Consent for procedure obtained. Time Out: Verified patient identification, verified procedure, site/side was marked, verified correct patient position, special equipment/implants available, medications/allergies/relevent history reviewed, required imaging and test results available.   Performed.  The area was cleaned with iodine and alcohol swabs.    The right knee superior lateral suprapatellar pouch was injected using 3 cc's of 1% lidocaine with a 22 1 1/2" needle in order to achieve anesthesia of the skin in the tract.  An 18-gauge inch and a half needle was inserted under ultrasound guidance for aspiration.  The syringe was then switched and a mixture of Orthovisc was then injected.Marland Kitchen  Ultrasound was used. Images were obtained in Long views showing the injection.    Amount of Fluid Aspirated: 52mL Character of Fluid: straw colored and cloudy Fluid was sent QVZ:DGLO count, culture, crystal identification and glucose A sterile dressing was applied.  Patient did tolerate procedure well.         ASSESSMENT & PLAN:   OA (osteoarthritis) of knee Has significant effusion on exam and started her first series of Orthovisc. -Aspiration was sent for analysis. -First Orthovisc injection today. -We will follow-up in 1 week to continue the series.

## 2017-11-29 ENCOUNTER — Other Ambulatory Visit: Payer: Self-pay | Admitting: Nurse Practitioner

## 2017-11-29 DIAGNOSIS — E1141 Type 2 diabetes mellitus with diabetic mononeuropathy: Secondary | ICD-10-CM

## 2017-11-29 LAB — SYNOVIAL CELL COUNT + DIFF, W/ CRYSTALS
BASOPHILS, %: 0 %
EOSINOPHILS-SYNOVIAL: 0 % (ref 0–2)
Lymphocytes-Synovial Fld: 3 % (ref 0–74)
Monocyte/Macrophage: 40 % (ref 0–69)
Neutrophil, Synovial: 57 % — ABNORMAL HIGH (ref 0–24)
SYNOVIOCYTES, %: 0 % (ref 0–15)
WBC, Synovial: 1395 cells/uL — ABNORMAL HIGH (ref <150)

## 2017-11-30 NOTE — Assessment & Plan Note (Signed)
Has significant effusion on exam and started her first series of Orthovisc. -Aspiration was sent for analysis. -First Orthovisc injection today. -We will follow-up in 1 week to continue the series.

## 2017-12-01 ENCOUNTER — Telehealth: Payer: Self-pay | Admitting: Family Medicine

## 2017-12-01 NOTE — Telephone Encounter (Signed)
Unable to leave VM for patient. If she calls back please have her speak with a nurse/CMA and inform that her aspiration doesn't suggest an infection but inflammation. The [facility] can report results to patient.   If any questions then please take the best time and phone number to call and I will try to call her back.   Rosemarie Ax, MD [facility] 12/01/2017, 9:15 AM

## 2017-12-05 ENCOUNTER — Encounter: Payer: Self-pay | Admitting: Family Medicine

## 2017-12-05 ENCOUNTER — Ambulatory Visit: Payer: Self-pay

## 2017-12-05 ENCOUNTER — Ambulatory Visit (INDEPENDENT_AMBULATORY_CARE_PROVIDER_SITE_OTHER): Payer: Medicare HMO | Admitting: Family Medicine

## 2017-12-05 VITALS — BP 154/64 | HR 72 | Resp 16 | Wt 168.0 lb

## 2017-12-05 DIAGNOSIS — M1711 Unilateral primary osteoarthritis, right knee: Secondary | ICD-10-CM | POA: Diagnosis not present

## 2017-12-05 NOTE — Progress Notes (Addendum)
Deanna Watson - 67 y.o. female MRN [id number]  Date of birth: 11/06/1950  SUBJECTIVE:  Including CC & ROS.  No chief complaint on file.   Deanna Watson is a 67 y.o. female that is  Presenting for a second in her series of orthovisc injections for right knee OA and pain.   Review of Systems  HISTORY: Past Medical, Surgical, Social, and Family History Reviewed & Updated per EMR.   Pertinent Historical Findings include:  Past Medical History:  Diagnosis Date  . Allergy   . Anemia    history of  . Arthritis   . Asthma   . Bell's palsy    lasted only about 2 days  . Chicken pox   . Chronic bronchitis ([place])   . COPD (chronic obstructive pulmonary disease) ([place])   . Depression   . Diabetes mellitus without complication ([place])   . Fatty liver    mild  . Fibromyalgia   . GERD (gastroesophageal reflux disease)   . Heart murmur   . History of blood transfusion   . Hyperlipidemia   . Hypertension   . Irregular heart beats   . Mild cardiomegaly   . Peripheral neuropathy   . PONV (postoperative nausea and vomiting)   . Pulmonary vascular congestion    mild  . Sciatica   . WPW (Wolff-Parkinson-White syndrome)     Past Surgical History:  Procedure Laterality Date  . CARDIAC ELECTROPHYSIOLOGY [place] AND ABLATION  2009  . CERVICAL CONE BIOPSY    . COLONOSCOPY    . LAPAROSCOPIC CHOLECYSTECTOMY SINGLE SITE WITH INTRAOPERATIVE CHOLANGIOGRAM N/A 01/23/2017   Procedure: LAPAROSCOPIC CHOLECYSTECTOMY SINGLE SITE WITH INTRAOPERATIVE CHOLANGIOGRAM ERAS PATHWAY;  Surgeon: Michael Boston, MD;  Location: [facility];  Service: General;  Laterality: N/A;  . LIVER BIOPSY N/A 01/23/2017   Procedure: BIOPSY OF LIVER;  Surgeon: Michael Boston, MD;  Location: [facility];  Service: General;  Laterality: N/A;  . REPLACEMENT TOTAL KNEE Left 2005  . UPPER GI ENDOSCOPY      No Known Allergies  Family History  Problem Relation Age of Onset  . Arthritis Mother   . Heart disease Mother   . Stroke Mother    . Hypertension Mother   . Kidney disease Mother   . Diabetes Mother   . Alcohol abuse Father   . Epilepsy Father   . Heart disease Brother   . Ovarian cancer Paternal Aunt   . Stomach cancer Paternal Uncle   . Breast cancer Maternal Aunt   . Stomach cancer Maternal Uncle   . Colon cancer Neg Hx      Social History   Socioeconomic History  . Marital status: Married    Spouse name: Not on file  . Number of children: 1  . Years of education: 99  . Highest education level: Not on file  Occupational History  . Occupation: Retired  Scientific laboratory technician  . Financial resource strain: Not on file  . Food insecurity:    Worry: Not on file    Inability: Not on file  . Transportation needs:    Medical: Not on file    Non-medical: Not on file  Tobacco Use  . Smoking status: Never Smoker  . Smokeless tobacco: Never Used  Substance and Sexual Activity  . Alcohol use: No  . Drug use: No  . Sexual activity: Not on file  Lifestyle  . Physical activity:    Days per week: Not on file    Minutes per session: Not on file  .  Stress: Not on file  Relationships  . Social connections:    Talks on phone: Not on file    Gets together: Not on file    Attends religious service: Not on file    Active member of club or organization: Not on file    Attends meetings of clubs or organizations: Not on file    Relationship status: Not on file  . Intimate partner violence:    Fear of current or ex partner: Not on file    Emotionally abused: Not on file    Physically abused: Not on file    Forced sexual activity: Not on file  Other Topics Concern  . Not on file  Social History Narrative   Denies abuse and feels safe at home.      PHYSICAL EXAM:  VS: BP (!) 154/64   Pulse 72   Resp 16   Wt 168 lb (76.2 kg)   SpO2 96%   BMI 32.81 kg/m  Physical Exam Gen: NAD, alert, cooperative with exam, well-appearing   Aspiration/Injection Procedure Note Deanna Watson [birth year]  Procedure:  Aspiration and Injection Indications: right knee pain   Procedure Details Consent: Risks of procedure as well as the alternatives and risks of each were explained to the (patient/caregiver).  Consent for procedure obtained. Time Out: Verified patient identification, verified procedure, site/side was marked, verified correct patient position, special equipment/implants available, medications/allergies/relevent history reviewed, required imaging and test results available.  Performed.  The area was cleaned with iodine and alcohol swabs.    The right knee superior lateral suprapatellar pouch was injected 3  cc's of 1% lidocaine with a 22 1 1/2" needle to anesthetize the skin in the tract.  An 18-gauge inch and half needle was inserted to aspirate.  The syringe was then switched off and a mixture containing Orthovisc was injected.  Ultrasound was used. Images were obtained in Transverse and Long views showing the injection.    Amount of Fluid Aspirated: 55mL Character of Fluid: clear, straw colored and cloudy Fluid was sent for:n/a A sterile dressing was applied.  Patient did tolerate procedure well.       ASSESSMENT & PLAN:   OA (osteoarthritis) of knee Completed her second injection in a series of 3 of Orthovisc. - she would benefit from the use of a rollator for ambulation  -Follow-up in 1 week.

## 2017-12-05 NOTE — Patient Instructions (Signed)
Good to see you  Please follow up in one week to finish the series

## 2017-12-05 NOTE — Assessment & Plan Note (Addendum)
Completed her second injection in a series of 3 of Orthovisc. - she would benefit from the use of a rollator for ambulation  -Follow-up in 1 week.

## 2017-12-10 NOTE — Addendum Note (Signed)
Addended by: Rosemarie Ax on: 12/10/2017 10:34 AM   Modules accepted: Orders

## 2017-12-18 NOTE — Progress Notes (Signed)
Deanna Watson - 67 y.o. female MRN [id number]  Date of birth: [birth year]  SUBJECTIVE:  Including CC & ROS.  Chief Complaint  Patient presents with  . Follow-up    R knee pain    Deanna Watson is a 67 y.o. female that is presenting for f/u of R knee pain.  She is here today to have her 3rd Orthovisc injection in a series of 3.  She states that she has gotten some limited relief from the prior 2 injections but states that she still cannot walk "right" and has an antalgic gait pattern.  She reports that she con't to have tenderness to palpation and has to sleep w/ a pillow between her knees.  Blair Promise, ATC am serving as a Presenter, broadcasting for Dr. Raeford Razor today.   Review of Systems  HISTORY: Past Medical, Surgical, Social, and Family History Reviewed & Updated per EMR.   Pertinent Historical Findings include:  Past Medical History:  Diagnosis Date  . Allergy   . Anemia    history of  . Arthritis   . Asthma   . Bell's palsy    lasted only about 2 days  . Chicken pox   . Chronic bronchitis ([place])   . COPD (chronic obstructive pulmonary disease) ([place])   . Depression   . Diabetes mellitus without complication ([place])   . Fatty liver    mild  . Fibromyalgia   . GERD (gastroesophageal reflux disease)   . Heart murmur   . History of blood transfusion   . Hyperlipidemia   . Hypertension   . Irregular heart beats   . Mild cardiomegaly   . Peripheral neuropathy   . PONV (postoperative nausea and vomiting)   . Pulmonary vascular congestion    mild  . Sciatica   . WPW (Wolff-Parkinson-White syndrome)     Past Surgical History:  Procedure Laterality Date  . CARDIAC ELECTROPHYSIOLOGY [place] AND ABLATION  2009  . CERVICAL CONE BIOPSY    . COLONOSCOPY    . LAPAROSCOPIC CHOLECYSTECTOMY SINGLE SITE WITH INTRAOPERATIVE CHOLANGIOGRAM N/A 01/23/2017   Procedure: LAPAROSCOPIC CHOLECYSTECTOMY SINGLE SITE WITH INTRAOPERATIVE CHOLANGIOGRAM ERAS PATHWAY;  Surgeon: Michael Boston, MD;   Location[facility];  Service: General;  Laterality: N/A;  . LIVER BIOPSY N/A 01/23/2017   Procedure: BIOPSY OF LIVER;  Surgeon: Michael Boston, MD;  Location: [facility];  Service: General;  Laterality: N/A;  . REPLACEMENT TOTAL KNEE Left 2005  . UPPER GI ENDOSCOPY      No Known Allergies  Family History  Problem Relation Age of Onset  . Arthritis Mother   . Heart disease Mother   . Stroke Mother   . Hypertension Mother   . Kidney disease Mother   . Diabetes Mother   . Alcohol abuse Father   . Epilepsy Father   . Heart disease Brother   . Ovarian cancer Paternal Aunt   . Stomach cancer Paternal Uncle   . Breast cancer Maternal Aunt   . Stomach cancer Maternal Uncle   . Colon cancer Neg Hx      Social History   Socioeconomic History  . Marital status: Married    Spouse name: Not on file  . Number of children: 1  . Years of education: 48  . Highest education level: Not on file  Occupational History  . Occupation: Retired  Scientific laboratory technician  . Financial resource strain: Not on file  . Food insecurity:    Worry: Not on file    Inability:  Not on file  . Transportation needs:    Medical: Not on file    Non-medical: Not on file  Tobacco Use  . Smoking status: Never Smoker  . Smokeless tobacco: Never Used  Substance and Sexual Activity  . Alcohol use: No  . Drug use: No  . Sexual activity: Not on file  Lifestyle  . Physical activity:    Days per week: Not on file    Minutes per session: Not on file  . Stress: Not on file  Relationships  . Social connections:    Talks on phone: Not on file    Gets together: Not on file    Attends religious service: Not on file    Active member of club or organization: Not on file    Attends meetings of clubs or organizations: Not on file    Relationship status: Not on file  . Intimate partner violence:    Fear of current or ex partner: Not on file    Emotionally abused: Not on file    Physically abused: Not on file    Forced sexual  activity: Not on file  Other Topics Concern  . Not on file  Social History Narrative   Denies abuse and feels safe at home.      PHYSICAL EXAM:  VS: BP 138/70 (BP Location: Left Arm, Patient Position: Sitting, Cuff Size: Normal)   Pulse 70   Ht 5' (1.524 m)   Wt 170 lb 1.3 oz (77.1 kg)   SpO2 96%   BMI 33.22 kg/m  Physical Exam Gen: NAD, alert, cooperative with exam, well-appearing    Aspiration/Injection Procedure Note Chiquitta Matty 1950-11-07  Procedure: Aspiration and Injection Indications: right knee pain   Procedure Details Consent: Risks of procedure as well as the alternatives and risks of each were explained to the (patient/caregiver).  Consent for procedure obtained. Time Out: Verified patient identification, verified procedure, site/side was marked, verified correct patient position, special equipment/implants available, medications/allergies/relevent history reviewed, required imaging and test results available.  Performed.  The area was cleaned with iodine and alcohol swabs.    The right knee superior lateral suprapatellar pouch was injected using using 4 cc's of 1% lidocaine with a 22 1 1/2" needle.  An 18-gauge inch and half needle was then inserted for aspiration.  The syringe was switched and a 2 mL 15 mg/mL of Orthovisc was injected. Ultrasound was used. Images were obtained in  Long views showing the injection.    Amount of Fluid Aspirated: 3mL Character of Fluid: clear and straw colored Fluid was sent for:n/a A sterile dressing was applied.  Patient did tolerate procedure well.     ASSESSMENT & PLAN:   OA (osteoarthritis) of knee Completed her series of Orthovisc today. -Can follow-up in 4 weeks.   -Could consider medial unloader brace   The above documentation has been reviewed and is accurate and complete. Clearance Coots, MD 12/22/2017, 10:43 PM>

## 2017-12-19 ENCOUNTER — Ambulatory Visit: Payer: Medicare HMO | Admitting: Nurse Practitioner

## 2017-12-19 ENCOUNTER — Ambulatory Visit (INDEPENDENT_AMBULATORY_CARE_PROVIDER_SITE_OTHER): Payer: Medicare HMO | Admitting: Family Medicine

## 2017-12-19 ENCOUNTER — Ambulatory Visit: Payer: Self-pay

## 2017-12-19 VITALS — BP 138/70 | HR 70 | Ht 60.0 in | Wt 170.1 lb

## 2017-12-19 DIAGNOSIS — M1711 Unilateral primary osteoarthritis, right knee: Secondary | ICD-10-CM | POA: Diagnosis not present

## 2017-12-19 NOTE — Patient Instructions (Signed)
Good to see you  Have fun on your vacation  Please see me in 4 weeks if you would like.

## 2017-12-22 ENCOUNTER — Ambulatory Visit: Payer: Medicare HMO | Admitting: Cardiology

## 2017-12-22 NOTE — Assessment & Plan Note (Signed)
Completed her series of Orthovisc today. -Can follow-up in 4 weeks.   -Could consider medial unloader brace

## 2017-12-23 ENCOUNTER — Encounter: Payer: Self-pay | Admitting: Vascular Surgery

## 2017-12-23 ENCOUNTER — Other Ambulatory Visit: Payer: Self-pay

## 2017-12-23 ENCOUNTER — Ambulatory Visit: Payer: Medicare HMO | Admitting: Vascular Surgery

## 2017-12-23 ENCOUNTER — Ambulatory Visit (HOSPITAL_COMMUNITY)
Admission: RE | Admit: 2017-12-23 | Discharge: 2017-12-23 | Disposition: A | Discharge status: Home / Self Care | Payer: Medicare HMO | Source: Ambulatory Visit | Attending: Vascular Surgery | Admitting: Vascular Surgery

## 2017-12-23 VITALS — BP 138/77 | HR 54 | Temp 97.5°F | Resp 16 | Ht 60.0 in | Wt 170.0 lb

## 2017-12-23 DIAGNOSIS — F330 Major depressive disorder, recurrent, mild: Secondary | ICD-10-CM | POA: Diagnosis not present

## 2017-12-23 DIAGNOSIS — J449 Chronic obstructive pulmonary disease, unspecified: Secondary | ICD-10-CM | POA: Diagnosis not present

## 2017-12-23 DIAGNOSIS — I724 Aneurysm of artery of lower extremity: Secondary | ICD-10-CM | POA: Insufficient documentation

## 2017-12-23 DIAGNOSIS — E1141 Type 2 diabetes mellitus with diabetic mononeuropathy: Secondary | ICD-10-CM | POA: Diagnosis not present

## 2017-12-23 NOTE — Progress Notes (Signed)
[facility] [place]  Patient name: Deanna Watson MRN: [id number] DOB: 05-Nov-1950 Sex: female  REASON FOR CONSULT: Discuss recent diagnosis of right popliteal artery aneurysm  HPI: Deanna Watson is a 67 y.o. female, who is here today for discussion of recent diagnosis of right popliteal artery aneurysm.  She is here today with her daughter.  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  Knowledge of this and does not have any prior history of aneurysmal disease.  She has had a total left knee replacement has had multiple injections into her right knee and continues to have difficulty associated with this.  She has no history of lower extremity arterial insufficiency or claudication type symptoms.  Past Medical History:  Diagnosis Date  . Allergy   . Anemia    history of  . Arthritis   . Asthma   . Bell's palsy    lasted only about 2 days  . Chicken pox   . Chronic bronchitis ([place])   . COPD (chronic obstructive pulmonary disease) ([place])   . Depression   . Diabetes mellitus without complication ([place])   . Fatty liver    mild  . Fibromyalgia   . GERD (gastroesophageal reflux disease)   . Heart murmur   . History of blood transfusion   . Hyperlipidemia   . Hypertension   . Irregular heart beats   . Mild cardiomegaly   . Peripheral neuropathy   . PONV (postoperative nausea and vomiting)   . Pulmonary vascular congestion    mild  . Sciatica   . WPW (Wolff-Parkinson-White syndrome)     Family History  Problem Relation Age of Onset  . Arthritis Mother   . Heart disease Mother   . Stroke Mother   . Hypertension Mother   . Kidney disease Mother   . Diabetes Mother   . Alcohol abuse Father   . Epilepsy Father   . Heart disease Brother   . Ovarian cancer Paternal Aunt   . Stomach cancer Paternal Uncle   . Breast cancer Maternal Aunt   . Stomach  cancer Maternal Uncle   . Colon cancer Neg Hx     SOCIAL HISTORY: Social History   Socioeconomic History  . Marital status: Married    Spouse name: Not on file  . Number of children: 1  . Years of education: 61  . Highest education level: Not on file  Occupational History  . Occupation: Retired  Scientific laboratory technician  . Financial resource strain: Not on file  . Food insecurity:    Worry: Not on file    Inability: Not on file  . Transportation needs:    Medical: Not on file    Non-medical: Not on file  Tobacco Use  . Smoking status: Never Smoker  . Smokeless tobacco: Never Used  Substance and Sexual Activity  . Alcohol use: No  . Drug use: No  . Sexual activity: Not on file  Lifestyle  . Physical activity:    Days per week: Not on file    Minutes per session: Not on file  . Stress: Not on file  Relationships  . Social connections:    Talks on phone: Not on file    Gets together: Not on file    Attends religious service: Not on file    Active member of club or organization: Not on file    Attends meetings of clubs or organizations:  Not on file    Relationship status: Not on file  . Intimate partner violence:    Fear of current or ex partner: Not on file    Emotionally abused: Not on file    Physically abused: Not on file    Forced sexual activity: Not on file  Other Topics Concern  . Not on file  Social History Narrative   Denies abuse and feels safe at home.     No Known Allergies  Current Outpatient Medications  Medication Sig Dispense Refill  . acetaminophen (TYLENOL) 500 MG tablet Take 1,000 mg by mouth 2 (two) times daily as needed for moderate pain or headache.    . budesonide-formoterol (SYMBICORT) 160-4.5 MCG/ACT inhaler Inhale 1 puff into the lungs 2 (two) times daily.    . calcium carbonate (TUMS - DOSED IN MG ELEMENTAL CALCIUM) 500 MG chewable tablet Chew 1-2 tablets by mouth daily as needed for indigestion or heartburn.    . celecoxib (CELEBREX) 200 MG  capsule TAKE 1 CAPSULE EVERY DAY 90 capsule 0  . cetirizine (ZYRTEC) 10 MG tablet Take 10 mg by mouth daily as needed for allergies.    . Cholecalciferol (VITAMIN D3) 5000 units CAPS Take 5,000 Units by mouth daily.    Marland Kitchen escitalopram (LEXAPRO) 20 MG tablet TAKE 1 TABLET EVERY DAY 90 tablet 2  . gabapentin (NEURONTIN) 300 MG capsule TAKE 1 CAPSULE THREE TIMES DAILY 270 capsule 0  . glimepiride (AMARYL) 2 MG tablet Take 1 tablet (2 mg total) by mouth daily before breakfast. 30 tablet 1  . glucose blood test strip Use to check blood sugars 2 times daily. Dx Code- E11.41 100 each 12  . lisinopril-hydrochlorothiazide (PRINZIDE,ZESTORETIC) 20-12.5 MG tablet Take 1 tablet by mouth daily. 90 tablet 1  . lovastatin (MEVACOR) 10 MG tablet Take 1 tablet (10 mg total) by mouth at bedtime. 90 tablet 2  . Menthol (ICY HOT) 5 % PTCH Apply 1 patch topically daily as needed (pain).    . metFORMIN (GLUCOPHAGE-XR) 500 MG 24 hr tablet TAKE 1 TABLET TWICE DAILY 180 tablet 0  . Multiple Vitamin (MULTIVITAMIN) tablet Take 1 tablet by mouth daily.    Marland Kitchen omeprazole (PRILOSEC) 20 MG capsule Take 2 capsules (40 mg total) by mouth daily. 30 capsule 0   Current Facility-Administered Medications  Medication Dose Route Frequency Provider Last Rate Last Dose  . 0.9 %  sodium chloride infusion  500 mL Intravenous Continuous Armbruster, Carlota Raspberry, MD        REVIEW OF SYSTEMS:  [X]  denotes positive finding, [ ]  denotes negative finding Cardiac  Comments:  Chest pain or chest pressure: x   Shortness of breath upon exertion: x   Short of breath when lying flat:    Irregular heart rhythm: x       Vascular    Pain in calf, thigh, or hip brought on by ambulation: x   Pain in feet at night that wakes you up from your sleep:  x   Blood clot in your veins:    Leg swelling:         Pulmonary    Oxygen at home:    Productive cough:     Wheezing:         Neurologic    Sudden weakness in arms or legs:  x   Sudden numbness in  arms or legs:  x   Sudden onset of difficulty speaking or slurred speech: x   Temporary loss of vision in one  eye:     Problems with dizziness:         Gastrointestinal    Blood in stool:     Vomited blood:         Genitourinary    Burning when urinating:     Blood in urine:        Psychiatric    Major depression:         Hematologic    Bleeding problems:    Problems with blood clotting too easily:        Skin    Rashes or ulcers:        Constitutional    Fever or chills:      PHYSICAL EXAM: Vitals:   12/23/17 1527  BP: 138/77  Pulse: (!) 54  Resp: 16  Temp: (!) 97.5 F (36.4 C)  TempSrc: Oral  SpO2: 96%  Weight: 170 lb (77.1 kg)  Height: 5' (1.524 m)    GENERAL: The patient is a well-nourished female, in no acute distress. The vital signs are documented above. CARDIOVASCULAR: As radial and 2+ dorsalis pedis pulses bilaterally.  She does have prominent popliteal pulse on the right. PULMONARY: There is good air exchange  ABDOMEN: Soft and non-tender  MUSCULOSKELETAL: There are no major deformities or cyanosis. NEUROLOGIC: No focal weakness or paresthesias are detected. SKIN: There are no ulcers or rashes noted. PSYCHIATRIC: The patient has a normal affect.  DATA:  Duplex in our office today does not show any evidence of popliteal aneurysm.  I reviewed her plain films from September 29, 2017.  This does show a 6 mm calcified superficial femoral and above-knee popliteal artery and what appears to be a large 2-1/2 cm popliteal artery aneurysm and then normal popliteal artery below this.  MEDICAL ISSUES: I discussed this at length with the patient and her daughter present.  Unclear as to why we are not seeing popliteal dilatation with very clear-cut appearing aneurysm on vein film.  I have recommended CT angiogram of her abdomen pelvis and runoff.  Explained that there is a high incidence of abdominal aortic aneurysm in patients with popliteal artery aneurysms.  We  will see her back following the study for further discussion   Rosetta Posner, MD [facility] Tel [phone] Pager [phone]

## 2017-12-25 ENCOUNTER — Ambulatory Visit: Payer: Medicare HMO | Admitting: Nurse Practitioner

## 2017-12-26 ENCOUNTER — Encounter: Payer: Self-pay | Admitting: Nurse Practitioner

## 2017-12-26 ENCOUNTER — Other Ambulatory Visit: Payer: Self-pay

## 2017-12-26 ENCOUNTER — Other Ambulatory Visit (INDEPENDENT_AMBULATORY_CARE_PROVIDER_SITE_OTHER): Payer: Medicare HMO

## 2017-12-26 ENCOUNTER — Ambulatory Visit (INDEPENDENT_AMBULATORY_CARE_PROVIDER_SITE_OTHER): Payer: Medicare HMO | Admitting: Nurse Practitioner

## 2017-12-26 VITALS — BP 138/80 | HR 71 | Ht 60.0 in | Wt 169.0 lb

## 2017-12-26 DIAGNOSIS — E119 Type 2 diabetes mellitus without complications: Secondary | ICD-10-CM

## 2017-12-26 DIAGNOSIS — R200 Anesthesia of skin: Secondary | ICD-10-CM

## 2017-12-26 DIAGNOSIS — R413 Other amnesia: Secondary | ICD-10-CM

## 2017-12-26 DIAGNOSIS — I714 Abdominal aortic aneurysm, without rupture, unspecified: Secondary | ICD-10-CM

## 2017-12-26 DIAGNOSIS — I724 Aneurysm of artery of lower extremity: Secondary | ICD-10-CM

## 2017-12-26 LAB — VITAMIN D 25 HYDROXY (VIT D DEFICIENCY, FRACTURES): VITD: 65.17 ng/mL (ref 30.00–100.00)

## 2017-12-26 LAB — VITAMIN B12: Vitamin B-12: 709 pg/mL (ref 211–911)

## 2017-12-26 LAB — SEDIMENTATION RATE: Sed Rate: 65 mm/hr — ABNORMAL HIGH (ref 0–30)

## 2017-12-26 MED ORDER — EMPAGLIFLOZIN 25 MG PO TABS: 25.0000 mg | ORAL_TABLET | Freq: Every day | ORAL | 1 refills | Status: DC

## 2017-12-26 NOTE — Progress Notes (Signed)
Deanna Watson is a 67 y.o. female with the following history as recorded in EpicCare:  Patient Active Problem List   Diagnosis Date Noted  . OA (osteoarthritis) of knee 11/02/2017  . 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 Diabetic retinopathy ([place]) 06/12/2017  . Chronic cholecystitis with calculus 01/23/2017  . Peripheral neuropathy 12/11/2016  . Tubular adenoma of colon 10/15/2016  . Medicare annual wellness visit, subsequent 04/04/2016  . Routine adult health maintenance 04/04/2016  . Type 2 diabetes mellitus without complication, without long-term current use of insulin ([place]) 03/01/2016  . Essential hypertension 03/01/2016  . Mild episode of recurrent major depressive disorder ([place]) 03/01/2016  . Mixed hyperlipidemia 03/01/2016    Current Outpatient Medications  Medication Sig Dispense Refill  . acetaminophen (TYLENOL) 500 MG tablet Take 1,000 mg by mouth 2 (two) times daily as needed for moderate pain or headache.    . budesonide-formoterol (SYMBICORT) 160-4.5 MCG/ACT inhaler Inhale 1 puff into the lungs 2 (two) times daily.    . calcium carbonate (TUMS - DOSED IN MG ELEMENTAL CALCIUM) 500 MG chewable tablet Chew 1-2 tablets by mouth daily as needed for indigestion or heartburn.    . celecoxib (CELEBREX) 200 MG capsule TAKE 1 CAPSULE EVERY DAY 90 capsule 0  . cetirizine (ZYRTEC) 10 MG tablet Take 10 mg by mouth daily as needed for allergies.    . Cholecalciferol (VITAMIN D3) 5000 units CAPS Take 5,000 Units by mouth daily.    Marland Kitchen escitalopram (LEXAPRO) 20 MG tablet TAKE 1 TABLET EVERY DAY 90 tablet 2  . gabapentin (NEURONTIN) 300 MG capsule TAKE 1 CAPSULE THREE TIMES DAILY 270 capsule 0  . glucose blood test strip Use to check blood sugars 2 times daily. Dx Code- E11.41 100 each 12  . lisinopril-hydrochlorothiazide (PRINZIDE,ZESTORETIC) 20-12.5 MG tablet Take 1 tablet by mouth daily. 90 tablet 1  . lovastatin (MEVACOR) 10 MG tablet Take 1 tablet (10 mg total) by mouth at  bedtime. 90 tablet 2  . Menthol (ICY HOT) 5 % PTCH Apply 1 patch topically daily as needed (pain).    . metFORMIN (GLUCOPHAGE-XR) 500 MG 24 hr tablet TAKE 1 TABLET TWICE DAILY 180 tablet 0  . Multiple Vitamin (MULTIVITAMIN) tablet Take 1 tablet by mouth daily.    Marland Kitchen omeprazole (PRILOSEC) 20 MG capsule Take 2 capsules (40 mg total) by mouth daily. 30 capsule 0  . empagliflozin (JARDIANCE) 25 MG TABS tablet Take 25 mg by mouth daily. 30 tablet 1   Current Facility-Administered Medications  Medication Dose Route Frequency Provider Last Rate Last Dose  . 0.9 %  sodium chloride infusion  500 mL Intravenous Continuous Armbruster, Carlota Raspberry, MD        Allergies: Patient has no known allergies.  Past Medical History:  Diagnosis Date  . Allergy   . Anemia    history of  . Arthritis   . Asthma   . Bell's palsy    lasted only about 2 days  . Chicken pox   . Chronic bronchitis ([place])   . COPD (chronic obstructive pulmonary disease) ([place])   . Depression   . Diabetes mellitus without complication ([place])   . Fatty liver    mild  . Fibromyalgia   . GERD (gastroesophageal reflux disease)   . Heart murmur   . History of blood transfusion   . Hyperlipidemia   . Hypertension   . Irregular heart beats   . Mild cardiomegaly   . Peripheral neuropathy   . PONV (postoperative nausea and  vomiting)   . Pulmonary vascular congestion    mild  . Sciatica   . WPW (Wolff-Parkinson-White syndrome)     Past Surgical History:  Procedure Laterality Date  . CARDIAC ELECTROPHYSIOLOGY [place] AND ABLATION  2009  . CERVICAL CONE BIOPSY    . COLONOSCOPY    . LAPAROSCOPIC CHOLECYSTECTOMY SINGLE SITE WITH INTRAOPERATIVE CHOLANGIOGRAM N/A 01/23/2017   Procedure: LAPAROSCOPIC CHOLECYSTECTOMY SINGLE SITE WITH INTRAOPERATIVE CHOLANGIOGRAM ERAS PATHWAY;  Surgeon: Michael Boston, MD;  Location: [facility];  Service: General;  Laterality: N/A;  . LIVER BIOPSY N/A 01/23/2017   Procedure: BIOPSY OF LIVER;  Surgeon: Michael Boston, MD;  Location: [facility];  Service: General;  Laterality: N/A;  . REPLACEMENT TOTAL KNEE Left 2005  . UPPER GI ENDOSCOPY      Family History  Problem Relation Age of Onset  . Arthritis Mother   . Heart disease Mother   . Stroke Mother   . Hypertension Mother   . Kidney disease Mother   . Diabetes Mother   . Alcohol abuse Father   . Epilepsy Father   . Heart disease Brother   . Ovarian cancer Paternal Aunt   . Stomach cancer Paternal Uncle   . Breast cancer Maternal Aunt   . Stomach cancer Maternal Uncle   . Colon cancer Neg Hx     Social History   Tobacco Use  . Smoking status: Never Smoker  . Smokeless tobacco: Never Used  Substance Use Topics  . Alcohol use: No     Subjective:   Deanna Watson is here today for diabetes follow up, at her last OV on 11/24/17, we changed her jardiance to amaryl 2 daily due to medication cost. Her A1c has been fairly well controlled on jardiance but could not longer afford the medication and she said her insurance company was not able to provide an affordable alternative to try. She has also been maintained on metformin 500BID, which was continued. She tells me today that she started the amaryl, began to notice drops in glucose- down to 50s at times, feels shaky and weak and has to eat candy, stopped taking her am metformin dose but continued to experience drops in glucose. Tells me she does sometimes miss meals, often sleeps past breakfast. Glucometer today shows recent readings 50s-200s. She wants to go back on jardiance,  thinks she can afford it now  Lab Results  Component Value Date   HGBA1C 7.2 (A) 11/24/2017   She is also requesting evaluation of left middle finger numbness and forgetfulness today. States she notices intermittent numbness to her left middle finger for some time now, no injuries, deformities, swelling or discoloration. She has also noticed feeling forgetful for some time now, no syncope, confusion, just forgets her  thoughts.   ROS- See HPI  Objective:  Vitals:   12/26/17 1439 12/26/17 1504  BP: (!) 148/80 138/80  Pulse: 71   SpO2: 97%   Weight: 169 lb (76.7 kg)   Height: 5' (1.524 m)     General: Well developed, well nourished, in no acute distress  Skin : Warm and dry.  Head: Normocephalic and atraumatic  Eyes: Sclera and conjunctiva clear; pupils round and reactive to light; extraocular movements intact  Oropharynx: Pink, supple. No suspicious lesions  Neck: Supple. Lungs: Respirations unlabored; clear to auscultation bilaterally without wheeze, rales, rhonchi  CVS exam: normal rate, regular rhythm, normal S1, S2, distal pulses intact Neurologic: Alert and oriented; speech intact; face symmetrical; moves all extremities well; CNII-XII intact  without focal deficit   ] Assessment:  1. Type 2 diabetes mellitus without complication, without long-term current use of insulin ([place])   2. Numbness   3. Memory problem     Plan:   Return in about 1 month (around 01/25/2018) for DM- resuming jardiance, stopping amaryl.  Orders Placed This Encounter  Procedures  . Vitamin B12    Standing Status:   Future    Standing Expiration Date:   12/26/2018  . VITAMIN D 25 Hydroxy (Vit-D Deficiency, Fractures)    Standing Status:   Future    Standing Expiration Date:   12/26/2018  . ANA    Standing Status:   Future    Standing Expiration Date:   12/26/2018  . Sedimentation rate    Standing Status:   Future    Standing Expiration Date:   12/26/2018  . Rheumatoid factor    Standing Status:   Future    Standing Expiration Date:   12/27/2018    Requested Prescriptions   Signed Prescriptions Disp Refills  . empagliflozin (JARDIANCE) 25 MG TABS tablet 30 tablet 1    Sig: Take 25 mg by mouth daily.    Numbness Could be related to diabetes 08/13/17 TSH WNL Additional labs today F/U with further recommendations pending lab results - Vitamin B12; Future - VITAMIN D 25 Hydroxy (Vit-D Deficiency,  Fractures); Future - ANA; Future - Sedimentation rate; Future - Rheumatoid factor; Future  Memory problem Recent CBC, BMET, TSH WNL Additional labs today F/U with further recommendations pending lab results - Vitamin B12; Future

## 2017-12-26 NOTE — Assessment & Plan Note (Addendum)
Diabetes was well controlled on metformin and jardiance Due to episodes of hypoglycemia, stopping amaryl, continue metformin as prescribed, restart jardiance She will let me know if unable to afford  Jardiance again, samples given today RTC in 1 month for follow up-check glucose logs - empagliflozin (JARDIANCE) 25 MG TABS tablet; Take 25 mg by mouth daily.  Dispense: 30 tablet; Refill: 1

## 2017-12-26 NOTE — Patient Instructions (Addendum)
I have sent refill of jardiance, let me know if you can not pick up. Resume metformin 500 twice daily.  I will see you back in 1 month for follow up.

## 2017-12-29 LAB — ANA: Anti Nuclear Antibody(ANA): NEGATIVE

## 2017-12-29 LAB — RHEUMATOID FACTOR

## 2018-01-02 ENCOUNTER — Ambulatory Visit
Admission: RE | Admit: 2018-01-02 | Discharge: 2018-01-02 | Disposition: A | Discharge status: Home / Self Care | Payer: Medicare HMO | Source: Ambulatory Visit | Attending: Vascular Surgery | Admitting: Vascular Surgery

## 2018-01-02 DIAGNOSIS — I714 Abdominal aortic aneurysm, without rupture, unspecified: Secondary | ICD-10-CM

## 2018-01-02 DIAGNOSIS — I724 Aneurysm of artery of lower extremity: Secondary | ICD-10-CM

## 2018-01-02 DIAGNOSIS — I708 Atherosclerosis of other arteries: Secondary | ICD-10-CM | POA: Diagnosis not present

## 2018-01-02 MED ORDER — IOPAMIDOL (ISOVUE-370) INJECTION 76%
125.0000 mL | Freq: Once | INTRAVENOUS | Status: AC | PRN
  Administered 2018-01-02: 125 mL via INTRAVENOUS

## 2018-01-19 DIAGNOSIS — G8929 Other chronic pain: Secondary | ICD-10-CM | POA: Diagnosis not present

## 2018-01-22 ENCOUNTER — Ambulatory Visit (INDEPENDENT_AMBULATORY_CARE_PROVIDER_SITE_OTHER): Payer: Self-pay | Admitting: Podiatry

## 2018-01-22 DIAGNOSIS — Z5329 Procedure and treatment not carried out because of patient's decision for other reasons: Secondary | ICD-10-CM

## 2018-01-22 NOTE — Progress Notes (Signed)
No show for appoitnment 

## 2018-01-23 ENCOUNTER — Encounter: Payer: Self-pay | Admitting: Family Medicine

## 2018-01-23 ENCOUNTER — Ambulatory Visit (INDEPENDENT_AMBULATORY_CARE_PROVIDER_SITE_OTHER): Payer: Medicare HMO | Admitting: Family Medicine

## 2018-01-23 ENCOUNTER — Ambulatory Visit: Payer: Self-pay

## 2018-01-23 VITALS — BP 130/82 | HR 75 | Resp 16 | Wt 171.0 lb

## 2018-01-23 DIAGNOSIS — M1711 Unilateral primary osteoarthritis, right knee: Secondary | ICD-10-CM

## 2018-01-23 MED ORDER — DICLOFENAC SODIUM 2 % TD SOLN: 1.0000 "application " | Freq: Two times a day (BID) | TRANSDERMAL | 3 refills | Status: DC

## 2018-01-23 NOTE — Progress Notes (Signed)
Deanna Watson - 67 y.o. female MRN [id number]  Date of birth: 04-21-1950  SUBJECTIVE:  Including CC & ROS.  No chief complaint on file.   Deanna Watson is a 67 y.o. female that is presenting with acute on chronic right knee pain.  She is undergone gel injections as well as other steroid injections.  Pain seems to be worse as it has been.  Denies any inciting event.  Pain significant with walking.  Having to use a rolling walker.  Pain is sharp and stabbing.  Pain localized to the knee.  No improvement with any counter medications.  Denies any mechanical symptoms.  Independent review of the CT angiogram from 11/15 shows right knee with moderate joint space narrowing.   Review of Systems  Constitutional: Negative for fever.  HENT: Negative for congestion.   Respiratory: Negative for cough.   Cardiovascular: Negative for chest pain.  Gastrointestinal: Negative for abdominal pain.  Musculoskeletal: Positive for arthralgias and gait problem.  Skin: Negative for color change.  Neurological: Negative for weakness.  Hematological: Negative for adenopathy.  Psychiatric/Behavioral: Negative for agitation.    HISTORY: Past Medical, Surgical, Social, and Family History Reviewed & Updated per EMR.   Pertinent Historical Findings include:  Past Medical History:  Diagnosis Date  . Allergy   . Anemia    history of  . Arthritis   . Asthma   . Bell's palsy    lasted only about 2 days  . Chicken pox   . Chronic bronchitis ([place])   . COPD (chronic obstructive pulmonary disease) ([place])   . Depression   . Diabetes mellitus without complication ([place])   . Fatty liver    mild  . Fibromyalgia   . GERD (gastroesophageal reflux disease)   . Heart murmur   . History of blood transfusion   . Hyperlipidemia   . Hypertension   . Irregular heart beats   . Mild cardiomegaly   . Peripheral neuropathy   . PONV (postoperative nausea and vomiting)   . Pulmonary vascular congestion    mild  .  Sciatica   . WPW (Wolff-Parkinson-White syndrome)     Past Surgical History:  Procedure Laterality Date  . CARDIAC ELECTROPHYSIOLOGY [place] AND ABLATION  2009  . CERVICAL CONE BIOPSY    . COLONOSCOPY    . LAPAROSCOPIC CHOLECYSTECTOMY SINGLE SITE WITH INTRAOPERATIVE CHOLANGIOGRAM N/A 01/23/2017   Procedure: LAPAROSCOPIC CHOLECYSTECTOMY SINGLE SITE WITH INTRAOPERATIVE CHOLANGIOGRAM ERAS PATHWAY;  Surgeon: Michael Boston, MD;  Location[facility];  Service: General;  Laterality: N/A;  . LIVER BIOPSY N/A 01/23/2017   Procedure: BIOPSY OF LIVER;  Surgeon: Michael Boston, MD;  Location: [facility];  Service: General;  Laterality: N/A;  . REPLACEMENT TOTAL KNEE Left 2005  . UPPER GI ENDOSCOPY      No Known Allergies  Family History  Problem Relation Age of Onset  . Arthritis Mother   . Heart disease Mother   . Stroke Mother   . Hypertension Mother   . Kidney disease Mother   . Diabetes Mother   . Alcohol abuse Father   . Epilepsy Father   . Heart disease Brother   . Ovarian cancer Paternal Aunt   . Stomach cancer Paternal Uncle   . Breast cancer Maternal Aunt   . Stomach cancer Maternal Uncle   . Colon cancer Neg Hx      Social History   Socioeconomic History  . Marital status: Married    Spouse name: Not on file  . Number of  children: 1  . Years of education: 97  . Highest education level: Not on file  Occupational History  . Occupation: Retired  Scientific laboratory technician  . Financial resource strain: Not on file  . Food insecurity:    Worry: Not on file    Inability: Not on file  . Transportation needs:    Medical: Not on file    Non-medical: Not on file  Tobacco Use  . Smoking status: Never Smoker  . Smokeless tobacco: Never Used  Substance and Sexual Activity  . Alcohol use: No  . Drug use: No  . Sexual activity: Not on file  Lifestyle  . Physical activity:    Days per week: Not on file    Minutes per session: Not on file  . Stress: Not on file  Relationships  . Social  connections:    Talks on phone: Not on file    Gets together: Not on file    Attends religious service: Not on file    Active member of club or organization: Not on file    Attends meetings of clubs or organizations: Not on file    Relationship status: Not on file  . Intimate partner violence:    Fear of current or ex partner: Not on file    Emotionally abused: Not on file    Physically abused: Not on file    Forced sexual activity: Not on file  Other Topics Concern  . Not on file  Social History Narrative   Denies abuse and feels safe at home.      PHYSICAL EXAM:  VS: BP 130/82   Pulse 75   Resp 16   Wt 171 lb (77.6 kg)   SpO2 97%   BMI 33.40 kg/m  Physical Exam Gen: NAD, alert, cooperative with exam, well-appearing ENT: normal lips, normal nasal mucosa,  Eye: normal EOM, normal conjunctiva and lids CV:  no edema, +2 pedal pulses   Resp: no accessory muscle use, non-labored,   Skin: no rashes, no areas of induration  Neuro: normal tone, normal sensation to touch Psych:  normal insight, alert and oriented MSK:  Left knee: Normal to inspection with no erythema or obvious bony abnormalities. Small effusion present Tenderness to palpation of the medial lateral joint line. Palpation normal with no warmth,  patellar tenderness, or condyle tenderness. ROM full in flexion and extension and lower leg rotation. Instability with valgus and varus stress testing. Negative Mcmurray's tests. Non painful patellar compression. Patellar glide without crepitus. Patellar and quadriceps tendons unremarkable. Hamstring and quadriceps strength is normal.  Neurovascularly intact   Aspiration/Injection Procedure Note [place] Slabach [birth year]  Procedure: Injection Indications: Right knee pain  Procedure Details Consent: Risks of procedure as well as the alternatives and risks of each were explained to the (patient/caregiver).  Consent for procedure obtained. Time Out: Verified  patient identification, verified procedure, site/side was marked, verified correct patient position, special equipment/implants available, medications/allergies/relevent history reviewed, required imaging and test results available.  Performed.  The area was cleaned with iodine and alcohol swabs.    The right knee superior lateral suprapatellar pouch was injected using 1 cc's of 40 mg Kenalog and 4 cc's of 0.5% bupivacaine with a 22 1 1/2" needle.  Ultrasound was used. Images were obtained in Long views showing the injection.    A sterile dressing was applied.  Patient did tolerate procedure well.     ASSESSMENT & PLAN:   OA (osteoarthritis) of knee Has recent completed viscosupplementation about 4  weeks ago.  Still having significant right knee pain.  CT demonstrating significant medial joint space narrowing. -Injection. -Referral to orthopedics -[place]

## 2018-01-23 NOTE — Patient Instructions (Signed)
Good to see you  I have made a referral to surgery

## 2018-01-26 NOTE — Assessment & Plan Note (Signed)
Has recent completed viscosupplementation about 4 weeks ago.  Still having significant right knee pain.  CT demonstrating significant medial joint space narrowing. -Injection. -Referral to orthopedics -[place]

## 2018-01-28 ENCOUNTER — Encounter: Payer: Self-pay | Admitting: Cardiology

## 2018-02-02 ENCOUNTER — Telehealth: Payer: Self-pay | Admitting: Nurse Practitioner

## 2018-02-02 MED ORDER — CELECOXIB 200 MG PO CAPS: 200.0000 mg | ORAL_CAPSULE | Freq: Every day | ORAL | 0 refills | Status: DC

## 2018-02-02 NOTE — Telephone Encounter (Signed)
Refills sent. # 30 to [facility] and # 58 to [facility]. See meds.

## 2018-02-02 NOTE — Telephone Encounter (Signed)
Pt needs a refill of her celecoxib (CELEBREX) 200 MG capsule  Please send 30 days supply to [facility] and the rest to [facility], please advise

## 2018-02-04 ENCOUNTER — Ambulatory Visit: Payer: 59 | Admitting: Nurse Practitioner

## 2018-02-05 ENCOUNTER — Ambulatory Visit: Payer: Medicare HMO | Admitting: Cardiology

## 2018-02-23 ENCOUNTER — Ambulatory Visit: Payer: Medicare HMO | Admitting: Nurse Practitioner

## 2018-02-24 ENCOUNTER — Ambulatory Visit: Payer: Medicare HMO | Admitting: Vascular Surgery

## 2018-02-24 ENCOUNTER — Encounter

## 2018-02-24 ENCOUNTER — Other Ambulatory Visit: Payer: Medicare HMO

## 2018-02-26 DIAGNOSIS — M1711 Unilateral primary osteoarthritis, right knee: Secondary | ICD-10-CM | POA: Diagnosis not present

## 2018-03-04 ENCOUNTER — Encounter: Payer: Self-pay | Admitting: Nurse Practitioner

## 2018-03-04 ENCOUNTER — Ambulatory Visit (INDEPENDENT_AMBULATORY_CARE_PROVIDER_SITE_OTHER): Payer: Medicare HMO | Admitting: Nurse Practitioner

## 2018-03-04 ENCOUNTER — Telehealth: Payer: Self-pay | Admitting: Nurse Practitioner

## 2018-03-04 VITALS — BP 140/84 | HR 76 | Ht 60.0 in | Wt 169.0 lb

## 2018-03-04 DIAGNOSIS — R700 Elevated erythrocyte sedimentation rate: Secondary | ICD-10-CM

## 2018-03-04 DIAGNOSIS — F330 Major depressive disorder, recurrent, mild: Secondary | ICD-10-CM | POA: Diagnosis not present

## 2018-03-04 DIAGNOSIS — E782 Mixed hyperlipidemia: Secondary | ICD-10-CM

## 2018-03-04 DIAGNOSIS — R413 Other amnesia: Secondary | ICD-10-CM

## 2018-03-04 DIAGNOSIS — E119 Type 2 diabetes mellitus without complications: Secondary | ICD-10-CM

## 2018-03-04 DIAGNOSIS — E1141 Type 2 diabetes mellitus with diabetic mononeuropathy: Secondary | ICD-10-CM

## 2018-03-04 DIAGNOSIS — J302 Other seasonal allergic rhinitis: Secondary | ICD-10-CM

## 2018-03-04 DIAGNOSIS — I10 Essential (primary) hypertension: Secondary | ICD-10-CM | POA: Diagnosis not present

## 2018-03-04 MED ORDER — LISINOPRIL-HYDROCHLOROTHIAZIDE 20-12.5 MG PO TABS: 1.0000 | ORAL_TABLET | Freq: Every day | ORAL | 1 refills | Status: DC

## 2018-03-04 MED ORDER — GABAPENTIN 300 MG PO CAPS: ORAL_CAPSULE | ORAL | 0 refills | Status: DC

## 2018-03-04 MED ORDER — METFORMIN HCL ER 500 MG PO TB24: 500.0000 mg | ORAL_TABLET | Freq: Two times a day (BID) | ORAL | 2 refills | Status: DC

## 2018-03-04 MED ORDER — CELECOXIB 200 MG PO CAPS: 200.0000 mg | ORAL_CAPSULE | Freq: Every day | ORAL | 0 refills | Status: DC

## 2018-03-04 MED ORDER — EMPAGLIFLOZIN 25 MG PO TABS: 25.0000 mg | ORAL_TABLET | Freq: Every day | ORAL | 1 refills | Status: DC

## 2018-03-04 MED ORDER — ESCITALOPRAM OXALATE 20 MG PO TABS: 20.0000 mg | ORAL_TABLET | Freq: Every day | ORAL | 3 refills | Status: DC

## 2018-03-04 MED ORDER — LOVASTATIN 10 MG PO TABS: 10.0000 mg | ORAL_TABLET | Freq: Every day | ORAL | 3 refills | Status: DC

## 2018-03-04 NOTE — Assessment & Plan Note (Addendum)
Continue current medication update labs- she will return when fasting - Lipid panel; Future - Comprehensive metabolic panel; Future - lovastatin (MEVACOR) 10 MG tablet; Take 1 tablet (10 mg total) by mouth at bedtime.  Dispense: 90 tablet; Refill: 3

## 2018-03-04 NOTE — Assessment & Plan Note (Signed)
Continue current medications Update labs Discussed the role of healthy diabetic diet in the management of diabetes, additional information on AVS - empagliflozin (JARDIANCE) 25 MG TABS tablet; Take 25 mg by mouth daily.  Dispense: 30 tablet; Refill: 1 - Hemoglobin A1c; Future - Microalbumin / creatinine urine ratio; Future - Comprehensive metabolic panel; Future - metFORMIN (GLUCOPHAGE-XR) 500 MG 24 hr tablet; Take 1 tablet (500 mg total) by mouth 2 (two) times daily.  Dispense: 180 tablet; Refill: 2

## 2018-03-04 NOTE — Patient Instructions (Signed)
Please return to the lab downstairs for labwork when fasting- only water or black coffee for 6 hours prior  If your A1c is stable, we will meet again in 6 months, sooner If needed.   Diabetes Mellitus and Nutrition, Adult When you have diabetes (diabetes mellitus), it is very important to have healthy eating habits because your blood sugar (glucose) levels are greatly affected by what you eat and drink. Eating healthy foods in the appropriate amounts, at about the same times every day, can help you:  Control your blood glucose.  Lower your risk of heart disease.  Improve your blood pressure.  Reach or maintain a healthy weight. Every person with diabetes is different, and each person has different needs for a meal plan. Your health care provider may recommend that you work with a diet and nutrition specialist (dietitian) to make a meal plan that is best for you. Your meal plan may vary depending on factors such as:  The calories you need.  The medicines you take.  Your weight.  Your blood glucose, blood pressure, and cholesterol levels.  Your activity level.  Other health conditions you have, such as heart or kidney disease. How do carbohydrates affect me? Carbohydrates, also called carbs, affect your blood glucose level more than any other type of food. Eating carbs naturally raises the amount of glucose in your blood. Carb counting is a method for keeping track of how many carbs you eat. Counting carbs is important to keep your blood glucose at a healthy level, especially if you use insulin or take certain oral diabetes medicines. It is important to know how many carbs you can safely have in each meal. This is different for every person. Your dietitian can help you calculate how many carbs you should have at each meal and for each snack. Foods that contain carbs include:  Bread, cereal, rice, pasta, and crackers.  Potatoes and corn.  Peas, beans, and lentils.  Milk and  yogurt.  Fruit and juice.  Desserts, such as cakes, cookies, ice cream, and candy. How does alcohol affect me? Alcohol can cause a sudden decrease in blood glucose (hypoglycemia), especially if you use insulin or take certain oral diabetes medicines. Hypoglycemia can be a life-threatening condition. Symptoms of hypoglycemia (sleepiness, dizziness, and confusion) are similar to symptoms of having too much alcohol. If your health care provider says that alcohol is safe for you, follow these guidelines:  Limit alcohol intake to no more than 1 drink per day for nonpregnant women and 2 drinks per day for men. One drink equals 12 oz of beer, 5 oz of wine, or 1 oz of hard liquor.  Do not drink on an empty stomach.  Keep yourself hydrated with water, diet soda, or unsweetened iced tea.  Keep in mind that regular soda, juice, and other mixers may contain a lot of sugar and must be counted as carbs. What are tips for following this plan?  Reading food labels  Start by checking the serving size on the "Nutrition Facts" label of packaged foods and drinks. The amount of calories, carbs, fats, and other nutrients listed on the label is based on one serving of the item. Many items contain more than one serving per package.  Check the total grams (g) of carbs in one serving. You can calculate the number of servings of carbs in one serving by dividing the total carbs by 15. For example, if a food has 30 g of total carbs, it would be  equal to 2 servings of carbs.  Check the number of grams (g) of saturated and trans fats in one serving. Choose foods that have low or no amount of these fats.  Check the number of milligrams (mg) of salt (sodium) in one serving. Most people should limit total sodium intake to less than 2,300 mg per day.  Always check the nutrition information of foods labeled as "low-fat" or "nonfat". These foods may be higher in added sugar or refined carbs and should be avoided.  Talk to  your dietitian to identify your daily goals for nutrients listed on the label. Shopping  Avoid buying canned, premade, or processed foods. These foods tend to be high in fat, sodium, and added sugar.  Shop around the outside edge of the grocery store. This includes fresh fruits and vegetables, bulk grains, fresh meats, and fresh dairy. Cooking  Use low-heat cooking methods, such as baking, instead of high-heat cooking methods like deep frying.  Cook using healthy oils, such as olive, canola, or sunflower oil.  Avoid cooking with butter, cream, or high-fat meats. Meal planning  Eat meals and snacks regularly, preferably at the same times every day. Avoid going long periods of time without eating.  Eat foods high in fiber, such as fresh fruits, vegetables, beans, and whole grains. Talk to your dietitian about how many servings of carbs you can eat at each meal.  Eat 4-6 ounces (oz) of lean protein each day, such as lean meat, chicken, fish, eggs, or tofu. One oz of lean protein is equal to: ? 1 oz of meat, chicken, or fish. ? 1 egg. ?  cup of tofu.  Eat some foods each day that contain healthy fats, such as avocado, nuts, seeds, and fish. Lifestyle  Check your blood glucose regularly.  Exercise regularly as told by your health care provider. This may include: ? 150 minutes of moderate-intensity or vigorous-intensity exercise each week. This could be brisk walking, biking, or water aerobics. ? Stretching and doing strength exercises, such as yoga or weightlifting, at least 2 times a week.  Take medicines as told by your health care provider.  Do not use any products that contain nicotine or tobacco, such as cigarettes and e-cigarettes. If you need help quitting, ask your health care provider.  Work with a Social worker or diabetes educator to identify strategies to manage stress and any emotional and social challenges. Questions to ask a health care provider  Do I need to meet with  a diabetes educator?  Do I need to meet with a dietitian?  What number can I call if I have questions?  When are the best times to check my blood glucose? Where to find more information:  American Diabetes Association: diabetes.org  Academy of Nutrition and Dietetics: www.eatright.[employer] of Diabetes and Digestive and Kidney Diseases (NIH): [employer] Summary  A healthy meal plan will help you control your blood glucose and maintain a healthy lifestyle.  Working with a diet and nutrition specialist (dietitian) can help you make a meal plan that is best for you.  Keep in mind that carbohydrates (carbs) and alcohol have immediate effects on your blood glucose levels. It is important to count carbs and to use alcohol carefully. This information is not intended to replace advice given to you by your health care provider. Make sure you discuss any questions you have with your health care provider. Document Released: 11/01/2004 Document Revised: 09/04/2016 Document Reviewed: 03/11/2016 Elsevier Interactive Patient Education  2019  [employer].

## 2018-03-04 NOTE — Assessment & Plan Note (Signed)
Stable Continue current medications F/u for new, worsening symptoms - escitalopram (LEXAPRO) 20 MG tablet; Take 1 tablet (20 mg total) by mouth daily.  Dispense: 90 tablet; Refill: 3

## 2018-03-04 NOTE — Assessment & Plan Note (Signed)
Stable Continue current medications - lisinopril-hydrochlorothiazide (PRINZIDE,ZESTORETIC) 20-12.5 MG tablet; Take 1 tablet by mouth daily.  Dispense: 90 tablet; Refill: 1 - Comprehensive metabolic panel; Future

## 2018-03-04 NOTE — Telephone Encounter (Signed)
Patient came back into the office after her visit asking if a prescription for a  Nebulizer can be sent to [facility].  Please advise.

## 2018-03-04 NOTE — Progress Notes (Signed)
Deanna Watson is a 68 y.o. female with the following history as recorded in EpicCare:  Patient Active Problem List   Diagnosis Date Noted  . OA (osteoarthritis) of knee 11/02/2017  . 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 Diabetic retinopathy ([place]) 06/12/2017  . Chronic cholecystitis with calculus 01/23/2017  . Peripheral neuropathy 12/11/2016  . Tubular adenoma of colon 10/15/2016  . Medicare annual wellness visit, subsequent 04/04/2016  . Routine adult health maintenance 04/04/2016  . Type 2 diabetes mellitus without complication, without long-term current use of insulin ([place]) 03/01/2016  . Essential hypertension 03/01/2016  . Mild episode of recurrent major depressive disorder ([place]) 03/01/2016  . Mixed hyperlipidemia 03/01/2016    Current Outpatient Medications  Medication Sig Dispense Refill  . acetaminophen (TYLENOL) 500 MG tablet Take 1,000 mg by mouth 2 (two) times daily as needed for moderate pain or headache.    . budesonide-formoterol (SYMBICORT) 160-4.5 MCG/ACT inhaler Inhale 1 puff into the lungs 2 (two) times daily.    . calcium carbonate (TUMS - DOSED IN MG ELEMENTAL CALCIUM) 500 MG chewable tablet Chew 1-2 tablets by mouth daily as needed for indigestion or heartburn.    . celecoxib (CELEBREX) 200 MG capsule Take 1 capsule (200 mg total) by mouth daily. 90 capsule 0  . cetirizine (ZYRTEC) 10 MG tablet Take 10 mg by mouth daily as needed for allergies.    . Cholecalciferol (VITAMIN D3) 5000 units CAPS Take 5,000 Units by mouth daily.    . Diclofenac Sodium (PENNSAID) 2 % SOLN Place 1 application onto the skin 2 (two) times daily. 1 Bottle 3  . empagliflozin (JARDIANCE) 25 MG TABS tablet Take 25 mg by mouth daily. 30 tablet 1  . escitalopram (LEXAPRO) 20 MG tablet Take 1 tablet (20 mg total) by mouth daily. 90 tablet 3  . gabapentin (NEURONTIN) 300 MG capsule TAKE 1 CAPSULE THREE TIMES DAILY 270 capsule 0  . glucose blood test strip Use to check blood sugars 2  times daily. Dx Code- E11.41 100 each 12  . lisinopril-hydrochlorothiazide (PRINZIDE,ZESTORETIC) 20-12.5 MG tablet Take 1 tablet by mouth daily. 90 tablet 1  . lovastatin (MEVACOR) 10 MG tablet Take 1 tablet (10 mg total) by mouth at bedtime. 90 tablet 3  . Menthol (ICY HOT) 5 % PTCH Apply 1 patch topically daily as needed (pain).    . metFORMIN (GLUCOPHAGE-XR) 500 MG 24 hr tablet Take 1 tablet (500 mg total) by mouth 2 (two) times daily. 180 tablet 2  . Multiple Vitamin (MULTIVITAMIN) tablet Take 1 tablet by mouth daily.    Marland Kitchen omeprazole (PRILOSEC) 20 MG capsule Take 2 capsules (40 mg total) by mouth daily. 30 capsule 0  . TURMERIC PO Take by mouth daily.     Current Facility-Administered Medications  Medication Dose Route Frequency Provider Last Rate Last Dose  . 0.9 %  sodium chloride infusion  500 mL Intravenous Continuous Armbruster, Carlota Raspberry, MD        Allergies: Patient has no known allergies.  Past Medical History:  Diagnosis Date  . Allergy   . Anemia    history of  . Arthritis   . Asthma   . Bell's palsy    lasted only about 2 days  . Chicken pox   . Chronic bronchitis ([place])   . COPD (chronic obstructive pulmonary disease) ([place])   . Depression   . Diabetes mellitus without complication ([place])   . Fatty liver    mild  . Fibromyalgia   . GERD (  gastroesophageal reflux disease)   . Heart murmur   . History of blood transfusion   . Hyperlipidemia   . Hypertension   . Irregular heart beats   . Mild cardiomegaly   . Peripheral neuropathy   . PONV (postoperative nausea and vomiting)   . Pulmonary vascular congestion    mild  . Sciatica   . WPW (Wolff-Parkinson-White syndrome)     Past Surgical History:  Procedure Laterality Date  . CARDIAC ELECTROPHYSIOLOGY [place] AND ABLATION  2009  . CERVICAL CONE BIOPSY    . COLONOSCOPY    . LAPAROSCOPIC CHOLECYSTECTOMY SINGLE SITE WITH INTRAOPERATIVE CHOLANGIOGRAM N/A 01/23/2017   Procedure: LAPAROSCOPIC CHOLECYSTECTOMY SINGLE  SITE WITH INTRAOPERATIVE CHOLANGIOGRAM ERAS PATHWAY;  Surgeon: Michael Boston, MD;  Location: [facility];  Service: General;  Laterality: N/A;  . LIVER BIOPSY N/A 01/23/2017   Procedure: BIOPSY OF LIVER;  Surgeon: Michael Boston, MD;  Location: [facility];  Service: General;  Laterality: N/A;  . REPLACEMENT TOTAL KNEE Left 2005  . UPPER GI ENDOSCOPY      Family History  Problem Relation Age of Onset  . Arthritis Mother   . Heart disease Mother   . Stroke Mother   . Hypertension Mother   . Kidney disease Mother   . Diabetes Mother   . Alcohol abuse Father   . Epilepsy Father   . Heart disease Brother   . Ovarian cancer Paternal Aunt   . Stomach cancer Paternal Uncle   . Breast cancer Maternal Aunt   . Stomach cancer Maternal Uncle   . Colon cancer Neg Hx     Social History   Tobacco Use  . Smoking status: Never Smoker  . Smokeless tobacco: Never Used  Substance Use Topics  . Alcohol use: No     Subjective:  Deanna Watson is here today for follow up of diabetes. She is also requesting refills of all of her daily medications to be sent to her mail order pharmacy. We will also recheck her ESR, noted to be slightly elevated on labs at 12/26/17 OV. She did tell me on 12/26/17 OV she was having trouble with memory issues, we discussed this again today, but she says she does not want any further testing or follow up of this memory problem at this time, states "I already see too many doctors I dont need anymore right now."  Diabetes- maintained on metformin 500 BID And she was switched from amaryl back to jardiance 25 at last [place] on 12/26/17 due to hypoglycemia and patient preference, she had been on jardiance before but was having trouble affording, wanted to go back on at her 11/8 OV. Reports daily medication compliance without adverse medication effects. Reports home glucose readings have been mostly in the 140s since switching back to jardiance, no more episodes of hypoglycemia. Denies tremor,  diaphoresis, polyuria, polydipsia, polyphagia. She is also maintained on celebrex, neurontin for diabetic neuropathy, reports good control of pain, no noted adverse medication effects, pain has actually greatly improved over past few months.  Lab Results  Component Value Date   HGBA1C 7.2 (A) 11/24/2017   Lipids- maintained on lovastatin 10 daily Reports daily medication compliance without adverse medication effects.  Lab Results  Component Value Date   CHOL 171 03/19/2017   HDL 71.60 03/19/2017   LDLCALC 81 03/19/2017   TRIG 91.0 03/19/2017   CHOLHDL 2 03/19/2017   Hypertension -maintained on lisinopril-hctz 20-12.5 Reports daily medication compliance without adverse medication effects.  BP Readings from Last 3  Encounters:  03/04/18 140/84  01/23/18 130/82  12/26/17 138/80   Anxiety/depression- maintained on lexapro 20 daily without noted adverse effects Reports good control of mood on lexapro, no SI, HI  ROS- See HPI  Objective:  Vitals:   03/04/18 1350 03/04/18 1449  BP: (!) 142/84 140/84  Pulse: 76   SpO2: 96%   Weight: 169 lb (76.7 kg)   Height: 5' (1.524 m)     General: Well developed, well nourished, in no acute distress  Skin : Warm and dry.  Head: Normocephalic and atraumatic  Eyes: Sclera and conjunctiva clear; pupils round and reactive to light; extraocular movements intact   Oropharynx: Pink, supple. No suspicious lesions  Neck: Supple without thyromegaly Lungs: Respirations unlabored; clear to auscultation bilaterally without wheeze, rales, rhonchi  CVS exam: normal rate and regular rhythm, S1 and S2 normal.  Musculoskeletal: No deformities; no active joint inflammation  Extremities: No edema, cyanosis, clubbing  Vessels: Symmetric bilaterally  Neurologic: Alert and oriented; speech intact; face symmetrical; moves all extremities well; CNII-XII intact without focal deficit  Psychiatric: Normal mood and affect.  Assessment:  1. Type 2 diabetes  mellitus without complication, without long-term current use of insulin ([place])   2. Mixed hyperlipidemia   3. Memory problem   4. ESR raised   5. Diabetic mononeuropathy associated with type 2 diabetes mellitus ([place])   6. Mild episode of recurrent major depressive disorder ([place])   7. Essential hypertension     Plan:  10/27/17 CBC WNL  Memory problem Discussed/recommended further workup including imaging and/or referral to neurology but she declines F/U for new, worsening symptoms  ESR raised Recheck labs F/U with further recommendations pending lab results - Sed Rate (ESR); Future  Return in about 6 months (around 09/02/2018) for routine F/U.  Orders Placed This Encounter  Procedures  . Hemoglobin A1c    Standing Status:   Future    Standing Expiration Date:   03/05/2019  . Microalbumin / creatinine urine ratio    Standing Status:   Future    Standing Expiration Date:   03/05/2019  . Sed Rate (ESR)    Standing Status:   Future    Standing Expiration Date:   03/04/2019  . Lipid panel    Standing Status:   Future    Standing Expiration Date:   03/05/2019  . Comprehensive metabolic panel    Standing Status:   Future    Standing Expiration Date:   03/05/2019    Requested Prescriptions   Signed Prescriptions Disp Refills  . empagliflozin (JARDIANCE) 25 MG TABS tablet 30 tablet 1    Sig: Take 25 mg by mouth daily.  . metFORMIN (GLUCOPHAGE-XR) 500 MG 24 hr tablet 180 tablet 2    Sig: Take 1 tablet (500 mg total) by mouth 2 (two) times daily.  Marland Kitchen lovastatin (MEVACOR) 10 MG tablet 90 tablet 3    Sig: Take 1 tablet (10 mg total) by mouth at bedtime.  . gabapentin (NEURONTIN) 300 MG capsule 270 capsule 0    Sig: TAKE 1 CAPSULE THREE TIMES DAILY  . celecoxib (CELEBREX) 200 MG capsule 90 capsule 0    Sig: Take 1 capsule (200 mg total) by mouth daily.  Marland Kitchen escitalopram (LEXAPRO) 20 MG tablet 90 tablet 3    Sig: Take 1 tablet (20 mg total) by mouth daily.  Marland Kitchen lisinopril-hydrochlorothiazide  (PRINZIDE,ZESTORETIC) 20-12.5 MG tablet 90 tablet 1    Sig: Take 1 tablet by mouth daily.

## 2018-03-04 NOTE — Assessment & Plan Note (Signed)
Stable Continue current medications Continue working on diabetes control CBC up to date F/u for new, worsening symptoms - Comprehensive metabolic panel; Future - gabapentin (NEURONTIN) 300 MG capsule; TAKE 1 CAPSULE THREE TIMES DAILY  Dispense: 270 capsule; Refill: 0 - celecoxib (CELEBREX) 200 MG capsule; Take 1 capsule (200 mg total) by mouth daily.  Dispense: 90 capsule; Refill: 0

## 2018-03-05 NOTE — Telephone Encounter (Signed)
Can we find out what nebulizer she is asking for? I do not see where she has had a nebulizer before.

## 2018-03-05 NOTE — Telephone Encounter (Signed)
I spoke to patient- she states she has a 69 year old nebulizer for asthma flares. She uses Albuterol for this. Patient request rx for machine and supplies  to [facility]. She c/o wheezing and productive cough.

## 2018-03-05 NOTE — Telephone Encounter (Signed)
Left detailed message for pt to call back. CRM created. Need more details as to what is needed and why she is requesting this.

## 2018-03-06 ENCOUNTER — Other Ambulatory Visit (INDEPENDENT_AMBULATORY_CARE_PROVIDER_SITE_OTHER): Payer: Medicare HMO

## 2018-03-06 DIAGNOSIS — R700 Elevated erythrocyte sedimentation rate: Secondary | ICD-10-CM | POA: Diagnosis not present

## 2018-03-06 DIAGNOSIS — E782 Mixed hyperlipidemia: Secondary | ICD-10-CM | POA: Diagnosis not present

## 2018-03-06 DIAGNOSIS — E1141 Type 2 diabetes mellitus with diabetic mononeuropathy: Secondary | ICD-10-CM | POA: Diagnosis not present

## 2018-03-06 DIAGNOSIS — E119 Type 2 diabetes mellitus without complications: Secondary | ICD-10-CM | POA: Diagnosis not present

## 2018-03-06 DIAGNOSIS — I10 Essential (primary) hypertension: Secondary | ICD-10-CM | POA: Diagnosis not present

## 2018-03-06 LAB — LIPID PANEL
CHOL/HDL RATIO: 2
CHOLESTEROL: 175 mg/dL (ref 0–200)
HDL: 77.4 mg/dL (ref >39.00)
LDL CALC: 81 mg/dL (ref 0–99)
NonHDL: 97.29
TRIGLYCERIDES: 80 mg/dL (ref 0.0–149.0)
VLDL: 16 mg/dL (ref 0.0–40.0)

## 2018-03-06 LAB — COMPREHENSIVE METABOLIC PANEL
ALBUMIN: 4.2 g/dL (ref 3.5–5.2)
ALT: 13 U/L (ref 0–35)
AST: 20 U/L (ref 0–37)
Alkaline Phosphatase: 81 U/L (ref 39–117)
BUN: 14 mg/dL (ref 6–23)
CHLORIDE: 101 meq/L (ref 96–112)
CO2: 28 meq/L (ref 19–32)
Calcium: 9.6 mg/dL (ref 8.4–10.5)
Creatinine, Ser: 0.65 mg/dL (ref 0.40–1.20)
GFR: 90.41 mL/min (ref >60.00)
Glucose, Bld: 120 mg/dL — ABNORMAL HIGH (ref 70–99)
POTASSIUM: 3.7 meq/L (ref 3.5–5.1)
SODIUM: 140 meq/L (ref 135–145)
Total Bilirubin: 0.4 mg/dL (ref 0.2–1.2)
Total Protein: 7.3 g/dL (ref 6.0–8.3)

## 2018-03-06 LAB — MICROALBUMIN / CREATININE URINE RATIO
Creatinine,U: 71.3 mg/dL
Microalb Creat Ratio: 21.6 mg/g (ref 0.0–30.0)
Microalb, Ur: 15.4 mg/dL — ABNORMAL HIGH (ref 0.0–1.9)

## 2018-03-06 LAB — SEDIMENTATION RATE: SED RATE: 53 mm/h — AB (ref 0–30)

## 2018-03-06 LAB — HEMOGLOBIN A1C: HEMOGLOBIN A1C: 7.3 % — AB (ref 4.6–6.5)

## 2018-03-06 MED ORDER — ALBUTEROL SULFATE (2.5 MG/3ML) 0.083% IN NEBU: 2.5000 mg | INHALATION_SOLUTION | Freq: Four times a day (QID) | RESPIRATORY_TRACT | 1 refills | Status: DC | PRN

## 2018-03-06 NOTE — Telephone Encounter (Signed)
Done. See meds.  

## 2018-03-09 ENCOUNTER — Telehealth: Payer: Self-pay | Admitting: Nurse Practitioner

## 2018-03-09 DIAGNOSIS — R0789 Other chest pain: Secondary | ICD-10-CM

## 2018-03-09 NOTE — Telephone Encounter (Signed)
Pt needs a new referral to Cardiology. Please advise

## 2018-03-09 NOTE — Telephone Encounter (Signed)
Referral entered  

## 2018-03-12 ENCOUNTER — Telehealth: Payer: Self-pay

## 2018-03-12 NOTE — Telephone Encounter (Signed)
   [facility] Pre-operative Risk Assessment    Request for surgical clearance:  1. What type of surgery is being performed? Right total knee  2. When is this surgery scheduled? TBD   3. What type of clearance is required (medical clearance vs. Pharmacy clearance to hold med vs. Both)? Medical  4. Are there any medications that need to be held prior to surgery and how long? No  5. Practice name and name of physician performing surgery? [facility]  6. What is your office phone number [phone]   7.   What is your office fax number [phone]  8.   Anesthesia type Spinal   Kathyrn Lass 03/12/2018, 1:08 PM  _________________________________________________________________   (provider comments below)

## 2018-03-13 ENCOUNTER — Other Ambulatory Visit: Payer: Self-pay | Admitting: Nurse Practitioner

## 2018-03-13 DIAGNOSIS — R700 Elevated erythrocyte sedimentation rate: Secondary | ICD-10-CM

## 2018-03-13 DIAGNOSIS — M2550 Pain in unspecified joint: Secondary | ICD-10-CM

## 2018-03-13 DIAGNOSIS — E119 Type 2 diabetes mellitus without complications: Secondary | ICD-10-CM

## 2018-03-13 MED ORDER — METFORMIN HCL ER 500 MG PO TB24: 1000.0000 mg | ORAL_TABLET | Freq: Two times a day (BID) | ORAL | 2 refills | Status: DC

## 2018-03-13 NOTE — Telephone Encounter (Signed)
   Primary Cardiologist: Dr Percival Spanish (new)  Chart reviewed as part of pre-operative protocol coverage. Because of Sybella Ringold's past medical history he will require an office visit in order to better assess preoperative cardiovascular risk.  Pt has an office visit scheduled for 04/09/2018. Clearance to be determined at that time.  If applicable, this message will also be routed to pharmacy pool and/or primary cardiologist for input on holding anticoagulant/antiplatelet agent as requested below so that this information is available at time of patient's appointment.   Kerin Ransom, PA-C  03/13/2018, 3:39 PM

## 2018-03-26 ENCOUNTER — Other Ambulatory Visit: Payer: Self-pay | Admitting: Nurse Practitioner

## 2018-03-26 NOTE — Telephone Encounter (Signed)
Copied from [facility] [id number]. Topic: Quick Communication - Rx Refill/Question >> Mar 26, 2018 12:12 PM Reyne Dumas L wrote: Medication: tramadol  Has the patient contacted their pharmacy?yes - states she needs new script (Agent: If no, request that the patient contact the pharmacy for the refill.) (Agent: If yes, when and what did the pharmacy advise?)  Preferred Pharmacy (with phone number or street name): [facility] ([address]), [place] - [place] [phone] (Phone) [phone] (Fax)  Agent: Please be advised that RX refills may take up to 3 business days. We ask that you follow-up with your pharmacy.

## 2018-03-27 MED ORDER — TRAMADOL HCL 50 MG PO TABS: 50.0000 mg | ORAL_TABLET | Freq: Four times a day (QID) | ORAL | 0 refills | Status: DC | PRN

## 2018-03-27 NOTE — Telephone Encounter (Signed)
#  20 tramadol sent

## 2018-03-27 NOTE — Telephone Encounter (Signed)
Patient advised rx has been sent

## 2018-04-08 ENCOUNTER — Encounter: Payer: Self-pay | Admitting: Cardiology

## 2018-04-08 NOTE — Progress Notes (Signed)
Cardiology Office Note   Date:  04/09/2018   ID:  Deanna Watson, DOB 30-Oct-1950, MRN [id number]  PCP:  Lance Sell, NP  Cardiologist:   Buford Dresser, MD Referring:  Lance Sell, NP  Chief Complaint  Patient presents with  . Pre-op Exam      History of Present Illness: Deanna Watson is a 68 y.o. female who is referred by Lance Sell, NP for evaluation preop TKR.     She has not seen our practice before.  She has a history of popliteal aneurysm and WPW.  Her WPW was ablated around 2009 she thinks in [place].  I do not have these records.  She denies any palpitations to speak of since then.  Her lower extremities have been evaluated with CT angiography.  I see some mention of aortic atherosclerosis without aneurysm.  She has medial calcifications in both popliteal arteries and there is not clear evidence of aneurysm as far as I can tell.  She has not had any coronary artery disease or history of other cardiac events.  There was a mention of cardiomegaly probably from a chest x-ray but I do not see evidence of an echocardiogram.  She does not complain of any cardiovascular complaints. The patient denies any new symptoms such as chest discomfort, neck or arm discomfort. There has been no new shortness of breath, PND or orthopnea. There have been no reported palpitations, presyncope or syncope.  She is somewhat limited by joint pains but she can still do activities such as vacuuming or grocery shopping without chest discomfort or shortness of breath.  Past Medical History:  Diagnosis Date  . Allergy   . Anemia    history of  . Arthritis   . Asthma   . Bell's palsy    lasted only about 2 days  . Chicken pox   . Chronic bronchitis ([place])   . COPD (chronic obstructive pulmonary disease) ([place])   . Depression   . Diabetes mellitus without complication ([place])   . Fatty liver    mild  . Fibromyalgia   . GERD (gastroesophageal reflux disease)   .  History of blood transfusion   . Hyperlipidemia   . Hypertension   . Mild cardiomegaly   . Peripheral neuropathy   . PONV (postoperative nausea and vomiting)   . Sciatica   . WPW (Wolff-Parkinson-White syndrome)     Past Surgical History:  Procedure Laterality Date  . CARDIAC ELECTROPHYSIOLOGY [place] AND ABLATION  2009  . CERVICAL CONE BIOPSY    . COLONOSCOPY    . LAPAROSCOPIC CHOLECYSTECTOMY SINGLE SITE WITH INTRAOPERATIVE CHOLANGIOGRAM N/A 01/23/2017   Procedure: LAPAROSCOPIC CHOLECYSTECTOMY SINGLE SITE WITH INTRAOPERATIVE CHOLANGIOGRAM ERAS PATHWAY;  Surgeon: Michael Boston, MD;  Location[facility];  Service: General;  Laterality: N/A;  . LIVER BIOPSY N/A 01/23/2017   Procedure: BIOPSY OF LIVER;  Surgeon: Michael Boston, MD;  Location: [facility];  Service: General;  Laterality: N/A;  . REPLACEMENT TOTAL KNEE Left 2005  . UPPER GI ENDOSCOPY       Current Outpatient Medications  Medication Sig Dispense Refill  . acetaminophen (TYLENOL) 500 MG tablet Take 1,000 mg by mouth 2 (two) times daily as needed for moderate pain or headache.    . albuterol (PROVENTIL) (2.5 MG/3ML) 0.083% nebulizer solution Take 3 mLs (2.5 mg total) by nebulization every 6 (six) hours as needed for wheezing or shortness of breath. 150 mL 1  . budesonide-formoterol (SYMBICORT) 160-4.5 MCG/ACT inhaler Inhale 1 puff  into the lungs 2 (two) times daily.    . calcium carbonate (TUMS - DOSED IN MG ELEMENTAL CALCIUM) 500 MG chewable tablet Chew 1-2 tablets by mouth daily as needed for indigestion or heartburn.    . celecoxib (CELEBREX) 200 MG capsule Take 1 capsule (200 mg total) by mouth daily. 90 capsule 0  . cetirizine (ZYRTEC) 10 MG tablet Take 10 mg by mouth daily as needed for allergies.    . Cholecalciferol (VITAMIN D3) 5000 units CAPS Take 5,000 Units by mouth daily.    . Diclofenac Sodium (PENNSAID) 2 % SOLN Place 1 application onto the skin 2 (two) times daily. 1 Bottle 3  . empagliflozin (JARDIANCE) 25 MG TABS  tablet Take 25 mg by mouth daily. 30 tablet 1  . escitalopram (LEXAPRO) 20 MG tablet Take 1 tablet (20 mg total) by mouth daily. 90 tablet 3  . gabapentin (NEURONTIN) 300 MG capsule TAKE 1 CAPSULE THREE TIMES DAILY 270 capsule 0  . glucose blood test strip Use to check blood sugars 2 times daily. Dx Code- E11.41 100 each 12  . lisinopril-hydrochlorothiazide (PRINZIDE,ZESTORETIC) 20-12.5 MG tablet Take 1 tablet by mouth daily. 90 tablet 1  . lovastatin (MEVACOR) 10 MG tablet Take 1 tablet (10 mg total) by mouth at bedtime. 90 tablet 3  . Menthol (ICY HOT) 5 % PTCH Apply 1 patch topically daily as needed (pain).    . metFORMIN (GLUCOPHAGE-XR) 500 MG 24 hr tablet Take 2 tablets (1,000 mg total) by mouth 2 (two) times daily. 180 tablet 2  . Multiple Vitamin (MULTIVITAMIN) tablet Take 1 tablet by mouth daily.    Marland Kitchen omeprazole (PRILOSEC) 20 MG capsule Take 2 capsules (40 mg total) by mouth daily. 30 capsule 0  . traMADol (ULTRAM) 50 MG tablet Take 1 tablet (50 mg total) by mouth every 6 (six) hours as needed. 20 tablet 0  . TURMERIC PO Take by mouth daily.     Current Facility-Administered Medications  Medication Dose Route Frequency Provider Last Rate Last Dose  . 0.9 %  sodium chloride infusion  500 mL Intravenous Continuous Armbruster, Carlota Raspberry, MD        Allergies:   Patient has no known allergies.    Social History:  The patient  reports that she has never smoked. She has never used smokeless tobacco. She reports that she does not drink alcohol or use drugs.   Family History:  The patient's family history includes Alcohol abuse in her father; Arthritis in her mother; Breast cancer in her maternal aunt; Diabetes in her mother; Epilepsy in her father; Heart disease in her brother and mother; Hypertension in her mother; Kidney disease in her mother; Ovarian cancer in her paternal aunt; Stomach cancer in her maternal uncle and paternal uncle; Stroke in her mother.    ROS:  Please see the history  of present illness.   Otherwise, review of systems are positive for none.   All other systems are reviewed and negative.    PHYSICAL EXAM: VS:  BP (!) 142/70 (BP Location: Left Arm)   Pulse 67   Ht 5' (1.524 m)   Wt 169 lb (76.7 kg)   SpO2 98%   BMI 33.01 kg/m  , BMI Body mass index is 33.01 kg/m. GENERAL:  Well appearing HEENT:  Pupils equal round and reactive, fundi not visualized, oral mucosa unremarkable NECK:  No jugular venous distention, waveform within normal limits, carotid upstroke brisk and symmetric, no bruits, no thyromegaly LYMPHATICS:  No cervical, inguinal  adenopathy LUNGS:  Clear to auscultation bilaterally BACK:  No CVA tenderness CHEST:  Unremarkable HEART:  PMI not displaced or sustained,S1 and S2 within normal limits, no S3, no S4, no clicks, no rubs, no murmurs ABD:  Flat, positive bowel sounds normal in frequency in pitch, no bruits, no rebound, no guarding, no midline pulsatile mass, no hepatomegaly, no splenomegaly EXT:  2 plus pulses throughout, no edema, no cyanosis no clubbing SKIN:  No rashes no nodules NEURO:  Cranial nerves II through XII grossly intact, motor grossly intact throughout PSYCH:  Cognitively intact, oriented to person place and time    EKG:  EKG is ordered today. The ekg ordered today demonstrates sinus rhythm, rate 67, axis within normal limits, nonspecific inferior T wave flattening, intervals within normal limits.   Recent Labs: 08/13/2017: Magnesium 2.1; TSH 0.75 10/27/2017: Hemoglobin 12.2; Platelets 195 03/06/2018: ALT 13; BUN 14; Creatinine, Ser 0.65; Potassium 3.7; Sodium 140    Lipid Panel    Component Value Date/Time   CHOL 175 03/06/2018 1242   TRIG 80.0 03/06/2018 1242   HDL 77.40 03/06/2018 1242   CHOLHDL 2 03/06/2018 1242   VLDL 16.0 03/06/2018 1242   LDLCALC 81 03/06/2018 1242      Wt Readings from Last 3 Encounters:  04/09/18 169 lb (76.7 kg)  03/04/18 169 lb (76.7 kg)  01/23/18 171 lb (77.6 kg)       Other studies Reviewed: Additional studies/ records that were reviewed today include: Labs, CT. Review of the above records demonstrates:  Please see elsewhere in the note.     ASSESSMENT AND PLAN:  PREOP: The patient has no high risk findings on physical or history.  She has reasonable/good functional level.  She is not going for high risk procedure.  Therefore, according to ACC/AHA guidelines the patient is at acceptable risk for surgery.  No further testing is indicated.  WPW: She has no recurrent symptoms.  No change in therapy is indicated.  AORTIC ATHEROSCLEROSIS: Patient has no symptoms related to this.  She needs continued aggressive primary risk reduction.  DM: A1c was not at target but she was just started on Jardiance.  She will continue with this.  She will have follow-up of this by Lance Sell, NP   DYSLIPIDEMIA: LDL is 81 and HDL 77.  I will suggest to her primary provider switching to at least moderate dose statin with Lipitor 40 or Crestor 20 if not higher but will defer to their management.    Current medicines are reviewed at length with the patient today.  The patient does not have concerns regarding medicines.  The following changes have been made:  no change  Labs/ tests ordered today include: None  Orders Placed This Encounter  Procedures  . EKG 12-Lead     Disposition:   FU with me as needed.     Signed, Minus Breeding, MD  04/09/2018 4:58 PM    [facility]

## 2018-04-09 ENCOUNTER — Ambulatory Visit: Payer: Medicare HMO | Admitting: Cardiology

## 2018-04-09 ENCOUNTER — Encounter: Payer: Self-pay | Admitting: Cardiology

## 2018-04-09 VITALS — BP 142/70 | HR 67 | Ht 60.0 in | Wt 169.0 lb

## 2018-04-09 DIAGNOSIS — E119 Type 2 diabetes mellitus without complications: Secondary | ICD-10-CM | POA: Diagnosis not present

## 2018-04-09 DIAGNOSIS — Z01810 Encounter for preprocedural cardiovascular examination: Secondary | ICD-10-CM | POA: Diagnosis not present

## 2018-04-09 DIAGNOSIS — I456 Pre-excitation syndrome: Secondary | ICD-10-CM | POA: Diagnosis not present

## 2018-04-09 DIAGNOSIS — I739 Peripheral vascular disease, unspecified: Secondary | ICD-10-CM | POA: Diagnosis not present

## 2018-04-09 DIAGNOSIS — I10 Essential (primary) hypertension: Secondary | ICD-10-CM

## 2018-04-09 NOTE — Patient Instructions (Signed)

## 2018-04-14 ENCOUNTER — Telehealth: Payer: Self-pay | Admitting: Cardiology

## 2018-04-14 NOTE — Telephone Encounter (Signed)
Pt called our operator to find out about preop clearance. I now have faxed office note from 04/09/2018 to the requesting provider providing clearance per Dr. Percival Spanish.

## 2018-04-15 ENCOUNTER — Other Ambulatory Visit: Payer: Self-pay | Admitting: Orthopedic Surgery

## 2018-05-14 ENCOUNTER — Encounter: Payer: Self-pay | Admitting: Family Medicine

## 2018-05-14 ENCOUNTER — Ambulatory Visit (INDEPENDENT_AMBULATORY_CARE_PROVIDER_SITE_OTHER): Payer: Medicare HMO | Admitting: Family Medicine

## 2018-05-14 ENCOUNTER — Other Ambulatory Visit: Payer: Self-pay

## 2018-05-14 ENCOUNTER — Ambulatory Visit: Payer: Self-pay

## 2018-05-14 VITALS — BP 110/52 | HR 79 | Ht 60.0 in | Wt 167.0 lb

## 2018-05-14 DIAGNOSIS — M1711 Unilateral primary osteoarthritis, right knee: Secondary | ICD-10-CM | POA: Diagnosis not present

## 2018-05-14 NOTE — Assessment & Plan Note (Signed)
Acute on chronic worsening of her underlying degenerative changes.  Has elective knee replacement scheduled for sometime in April. -Injection. -Counseled on home exercise therapy and supportive care. -Could consider gel injections again.

## 2018-05-14 NOTE — Patient Instructions (Signed)
Good to see you  Sorry to hear about your surgery being postponed  Please continue tylenol and ice  Please try to use compression  Please see [place] back if needed.  Please try to continue the exercises for the knee.

## 2018-05-14 NOTE — Progress Notes (Signed)
Deanna Watson - 68 y.o. female MRN [id number]  Date of birth: 05/21/1950  SUBJECTIVE:  Including CC & ROS.  No chief complaint on file. Fontaine No, am serving as a Education administrator for [employer].   Deanna Watson is a 68 y.o. female that is presenting with acute on chronic worsening right knee pain.  The pain is on the medial joint line.  She has had this pain previously.  She has received steroid injections as well as hyaluronic acid injections previously.  The last injection was in December.  She was scheduled to have a knee replacement but that was delayed due to the coronavirus.  Denies any inciting event or injury.  The pain is constant and severe.  Pain is sharp and stabbing.  It is worse with ambulation.  She is having to walk slower than she normally does.  Is localized to the knee.   Review of Systems  Constitutional: Negative for fever.  HENT: Negative for congestion.   Respiratory: Negative for cough.   Cardiovascular: Negative for chest pain.  Gastrointestinal: Negative for abdominal pain.  Musculoskeletal: Positive for arthralgias and joint swelling.  Skin: Negative for color change.  Neurological: Negative for weakness.  Hematological: Negative for adenopathy.  Psychiatric/Behavioral: Negative for agitation.    HISTORY: Past Medical, Surgical, Social, and Family History Reviewed & Updated per EMR.   Pertinent Historical Findings include:  Past Medical History:  Diagnosis Date  . Allergy   . Anemia    history of  . Arthritis   . Asthma   . Bell's palsy    lasted only about 2 days  . Chicken pox   . Chronic bronchitis ([place])   . COPD (chronic obstructive pulmonary disease) ([place])   . Depression   . Diabetes mellitus without complication ([place])   . Fatty liver    mild  . Fibromyalgia   . GERD (gastroesophageal reflux disease)   . History of blood transfusion   . Hyperlipidemia   . Hypertension   . Mild cardiomegaly   . Peripheral neuropathy   . PONV  (postoperative nausea and vomiting)   . Sciatica   . WPW (Wolff-Parkinson-White syndrome)     Past Surgical History:  Procedure Laterality Date  . CARDIAC ELECTROPHYSIOLOGY [place] AND ABLATION  2009  . CERVICAL CONE BIOPSY    . COLONOSCOPY    . LAPAROSCOPIC CHOLECYSTECTOMY SINGLE SITE WITH INTRAOPERATIVE CHOLANGIOGRAM N/A 01/23/2017   Procedure: LAPAROSCOPIC CHOLECYSTECTOMY SINGLE SITE WITH INTRAOPERATIVE CHOLANGIOGRAM ERAS PATHWAY;  Surgeon: Michael Boston, MD;  Location: [facility];  Service: General;  Laterality: N/A;  . LIVER BIOPSY N/A 01/23/2017   Procedure: BIOPSY OF LIVER;  Surgeon: Michael Boston, MD;  Location: [facility];  Service: General;  Laterality: N/A;  . REPLACEMENT TOTAL KNEE Left 2005  . UPPER GI ENDOSCOPY      No Known Allergies  Family History  Problem Relation Age of Onset  . Arthritis Mother   . Heart disease Mother        CHF age 45  . Stroke Mother   . Hypertension Mother   . Kidney disease Mother   . Diabetes Mother   . Alcohol abuse Father   . Epilepsy Father   . Heart disease Brother        Died in his sleep age 32.    Marland Kitchen Ovarian cancer Paternal Aunt   . Stomach cancer Paternal Uncle   . Breast cancer Maternal Aunt   . Stomach cancer Maternal Uncle   . Colon  cancer Neg Hx      Social History   Socioeconomic History  . Marital status: Married    Spouse name: Not on file  . Number of children: 1  . Years of education: 104  . Highest education level: Not on file  Occupational History  . Occupation: Retired  Scientific laboratory technician  . Financial resource strain: Not on file  . Food insecurity:    Worry: Not on file    Inability: Not on file  . Transportation needs:    Medical: Not on file    Non-medical: Not on file  Tobacco Use  . Smoking status: Never Smoker  . Smokeless tobacco: Never Used  Substance and Sexual Activity  . Alcohol use: No  . Drug use: No  . Sexual activity: Not on file  Lifestyle  . Physical activity:    Days per week: Not on  file    Minutes per session: Not on file  . Stress: Not on file  Relationships  . Social connections:    Talks on phone: Not on file    Gets together: Not on file    Attends religious service: Not on file    Active member of club or organization: Not on file    Attends meetings of clubs or organizations: Not on file    Relationship status: Not on file  . Intimate partner violence:    Fear of current or ex partner: Not on file    Emotionally abused: Not on file    Physically abused: Not on file    Forced sexual activity: Not on file  Other Topics Concern  . Not on file  Social History Narrative   Lives at home with husband, dog and bird.       PHYSICAL EXAM:  VS: BP (!) 110/52   Pulse 79   Ht 5' (1.524 m)   Wt 167 lb (75.8 kg)   SpO2 97%   BMI 32.61 kg/m  Physical Exam Gen: NAD, alert, cooperative with exam, well-appearing ENT: normal lips, normal nasal mucosa,  Eye: normal EOM, normal conjunctiva and lids CV:  no edema, +2 pedal pulses   Resp: no accessory muscle use, non-labored,  Skin: no rashes, no areas of induration  Neuro: normal tone, normal sensation to touch Psych:  normal insight, alert and oriented MSK:  Right knee:  Obvious effusion. Tenderness to palpation of the medial joint line. Normal range of motion. Normal strength resistance. Some instability with valgus varus stress testing. Neurovascularly intact   Aspiration/Injection Procedure Note Deanna Watson 1950/11/05  Procedure: Injection Indications: Right knee pain  Procedure Details Consent: Risks of procedure as well as the alternatives and risks of each were explained to the (patient/caregiver).  Consent for procedure obtained. Time Out: Verified patient identification, verified procedure, site/side was marked, verified correct patient position, special equipment/implants available, medications/allergies/relevent history reviewed, required imaging and test results available.  Performed.   The area was cleaned with iodine and alcohol swabs.    The right knee superior lateral suprapatellar pouch was injected using 1 cc's of 40 mg Kenalog and 4 cc's of 0.5% bupivacaine with a 21 2" needle.  Ultrasound was used. Images were obtained in long views showing the injection.     A sterile dressing was applied.  Patient did tolerate procedure well.       ASSESSMENT & PLAN:   OA (osteoarthritis) of knee Acute on chronic worsening of her underlying degenerative changes.  Has elective knee replacement scheduled for sometime  in April. -Injection. -Counseled on home exercise therapy and supportive care. -Could consider gel injections again.

## 2018-05-28 ENCOUNTER — Other Ambulatory Visit: Payer: Self-pay | Admitting: *Deleted

## 2018-05-28 DIAGNOSIS — E119 Type 2 diabetes mellitus without complications: Secondary | ICD-10-CM

## 2018-05-28 MED ORDER — EMPAGLIFLOZIN 25 MG PO TABS: 25.0000 mg | ORAL_TABLET | Freq: Every day | ORAL | 2 refills | Status: DC

## 2018-06-03 ENCOUNTER — Ambulatory Visit: Admit: 2018-06-03 | Payer: Medicare HMO | Admitting: Orthopedic Surgery

## 2018-06-03 SURGERY — ARTHROPLASTY, KNEE, TOTAL
Anesthesia: Spinal | Laterality: Right

## 2018-06-04 ENCOUNTER — Ambulatory Visit: Payer: Medicare HMO | Admitting: Nurse Practitioner

## 2018-06-24 ENCOUNTER — Ambulatory Visit: Payer: Medicare HMO | Admitting: Nurse Practitioner

## 2018-06-29 ENCOUNTER — Other Ambulatory Visit: Payer: Self-pay

## 2018-06-29 ENCOUNTER — Ambulatory Visit: Payer: Medicare HMO | Admitting: Family Medicine

## 2018-06-29 ENCOUNTER — Ambulatory Visit: Payer: Medicare HMO | Admitting: Family

## 2018-06-29 ENCOUNTER — Encounter: Payer: Self-pay | Admitting: Family Medicine

## 2018-06-29 ENCOUNTER — Ambulatory Visit (INDEPENDENT_AMBULATORY_CARE_PROVIDER_SITE_OTHER): Payer: Medicare HMO | Admitting: Family Medicine

## 2018-06-29 ENCOUNTER — Ambulatory Visit (INDEPENDENT_AMBULATORY_CARE_PROVIDER_SITE_OTHER): Payer: Medicare HMO

## 2018-06-29 VITALS — BP 140/70 | HR 75 | Temp 98.1°F | Ht 60.0 in

## 2018-06-29 DIAGNOSIS — M1711 Unilateral primary osteoarthritis, right knee: Secondary | ICD-10-CM

## 2018-06-29 NOTE — Progress Notes (Signed)
Deanna Watson - 68 y.o. female MRN [id number]  Date of birth: 1950-04-08  SUBJECTIVE:  Including CC & ROS.  Chief Complaint  Patient presents with  . Follow-up    right knee pain f/u/ pt states right knee and left is bothering her/ requests injection    Deanna Watson is a 68 y.o. female that is  Presenting with acute on chronic right knee pain. Was supposed to have a right TKA in April but was canceled due to the coronavirus. Pain started a few weeks ago. Denies any inciting event. Last steroid injection didn't last long. Pain is throbbing. Pain is worse with ambulation and at night. Having some swelling. No mechanical symptoms. No improvement with home modalities.    Review of Systems  Constitutional: Negative for fever.  HENT: Negative for congestion.   Respiratory: Negative for cough.   Cardiovascular: Negative for chest pain.  Gastrointestinal: Negative for abdominal pain.  Musculoskeletal: Positive for arthralgias and gait problem.  Skin: Negative for color change.  Neurological: Negative for weakness.  Hematological: Negative for adenopathy.    HISTORY: Past Medical, Surgical, Social, and Family History Reviewed & Updated per EMR.   Pertinent Historical Findings include:  Past Medical History:  Diagnosis Date  . Allergy   . Anemia    history of  . Arthritis   . Asthma   . Bell's palsy    lasted only about 2 days  . Chicken pox   . Chronic bronchitis ([place])   . COPD (chronic obstructive pulmonary disease) ([place])   . Depression   . Diabetes mellitus without complication ([place])   . Fatty liver    mild  . Fibromyalgia   . GERD (gastroesophageal reflux disease)   . History of blood transfusion   . Hyperlipidemia   . Hypertension   . Mild cardiomegaly   . Peripheral neuropathy   . PONV (postoperative nausea and vomiting)   . Sciatica   . WPW (Wolff-Parkinson-White syndrome)     Past Surgical History:  Procedure Laterality Date  . CARDIAC ELECTROPHYSIOLOGY  [place] AND ABLATION  2009  . CERVICAL CONE BIOPSY    . COLONOSCOPY    . LAPAROSCOPIC CHOLECYSTECTOMY SINGLE SITE WITH INTRAOPERATIVE CHOLANGIOGRAM N/A 01/23/2017   Procedure: LAPAROSCOPIC CHOLECYSTECTOMY SINGLE SITE WITH INTRAOPERATIVE CHOLANGIOGRAM ERAS PATHWAY;  Surgeon: Michael Boston, MD;  Location[facility];  Service: General;  Laterality: N/A;  . LIVER BIOPSY N/A 01/23/2017   Procedure: BIOPSY OF LIVER;  Surgeon: Michael Boston, MD;  Location: [facility];  Service: General;  Laterality: N/A;  . REPLACEMENT TOTAL KNEE Left 2005  . UPPER GI ENDOSCOPY      No Known Allergies  Family History  Problem Relation Age of Onset  . Arthritis Mother   . Heart disease Mother        CHF age 13  . Stroke Mother   . Hypertension Mother   . Kidney disease Mother   . Diabetes Mother   . Alcohol abuse Father   . Epilepsy Father   . Heart disease Brother        Died in his sleep age 95.    Marland Kitchen Ovarian cancer Paternal Aunt   . Stomach cancer Paternal Uncle   . Breast cancer Maternal Aunt   . Stomach cancer Maternal Uncle   . Colon cancer Neg Hx      Social History   Socioeconomic History  . Marital status: Married    Spouse name: Not on file  . Number of children: 1  .  Years of education: 78  . Highest education level: Not on file  Occupational History  . Occupation: Retired  Scientific laboratory technician  . Financial resource strain: Not on file  . Food insecurity:    Worry: Not on file    Inability: Not on file  . Transportation needs:    Medical: Not on file    Non-medical: Not on file  Tobacco Use  . Smoking status: Never Smoker  . Smokeless tobacco: Never Used  Substance and Sexual Activity  . Alcohol use: No  . Drug use: No  . Sexual activity: Not on file  Lifestyle  . Physical activity:    Days per week: Not on file    Minutes per session: Not on file  . Stress: Not on file  Relationships  . Social connections:    Talks on phone: Not on file    Gets together: Not on file    Attends  religious service: Not on file    Active member of club or organization: Not on file    Attends meetings of clubs or organizations: Not on file    Relationship status: Not on file  . Intimate partner violence:    Fear of current or ex partner: Not on file    Emotionally abused: Not on file    Physically abused: Not on file    Forced sexual activity: Not on file  Other Topics Concern  . Not on file  Social History Narrative   Lives at home with husband, dog and bird.       PHYSICAL EXAM:  VS: BP 140/70   Pulse 75   Temp 98.1 F (36.7 C) (Oral)   Ht 5' (1.524 m)   SpO2 97%   BMI 32.61 kg/m  Physical Exam Gen: NAD, alert, cooperative with exam, well-appearing ENT: normal lips, normal nasal mucosa,  Eye: normal EOM, normal conjunctiva and lids CV:  no edema, +2 pedal pulses   Resp: no accessory muscle use, non-labored,  Skin: no rashes, no areas of induration  Neuro: normal tone, normal sensation to touch Psych:  normal insight, alert and oriented MSK:  Right knee:  Mild effusion  Normal ROM  Normal strength   Instability with valgus and varus testing  TTP along the medial joint line  Neurovascularly intact    Aspiration/Injection Procedure Note Deanna Watson Nov 07, 1950  Procedure: Injection Indications: Right knee pain   Procedure Details Consent: Risks of procedure as well as the alternatives and risks of each were explained to the (patient/caregiver).  Consent for procedure obtained. Time Out: Verified patient identification, verified procedure, site/side was marked, verified correct patient position, special equipment/implants available, medications/allergies/relevent history reviewed, required imaging and test results available.  Performed.  The area was cleaned with iodine and alcohol swabs.    The Right knee suprapatellar pouch was injected using 1 cc's of 40 mg kenalog and 4 cc's of 0.25% bupivacaine with a 22 1 1/2" needle.  Ultrasound was used. Images  were obtained in long views showing the injection.     A sterile dressing was applied.  Patient did tolerate procedure well.      ASSESSMENT & PLAN:   OA (osteoarthritis) of knee Acute on chronic worsening of her underlying knee OA.  - injection  - will have to re-schedule her TKA once she is moved into her new house in [facility]

## 2018-06-29 NOTE — Patient Instructions (Signed)
Good to see you  Please try ice and tylenol  Please send me a message through MyChart with any questions or updates.

## 2018-06-30 NOTE — Assessment & Plan Note (Signed)
Acute on chronic worsening of her underlying knee OA.  - injection  - will have to re-schedule her TKA once she is moved into her new house in [facility]

## 2018-07-03 ENCOUNTER — Telehealth: Payer: Self-pay | Admitting: Internal Medicine

## 2018-07-03 DIAGNOSIS — F330 Major depressive disorder, recurrent, mild: Secondary | ICD-10-CM

## 2018-07-03 MED ORDER — ESCITALOPRAM OXALATE 20 MG PO TABS: 20.0000 mg | ORAL_TABLET | Freq: Every day | ORAL | 0 refills | Status: DC

## 2018-07-03 NOTE — Telephone Encounter (Signed)
Done. See meds.  

## 2018-07-03 NOTE — Telephone Encounter (Signed)
Copied from [place] [id number]. Topic: Quick Communication - See Telephone Encounter >> Jul 03, 2018  2:36 PM Vernona Rieger wrote: CRM for notification. See Telephone encounter for: 07/03/18.  Patient needs about ten days worth of escitalopram (LEXAPRO) 20 MG tablet called into [facility]. She did call it in with [facility] but did not realize she only had one pill left. She wants just enough called in to get her by until the medication is delivered. Please Advise   Pharmacy [address] ([address]), [place] [address] [place] ([place])  [address] Phone: [phone] Fax: [phone]

## 2018-08-19 ENCOUNTER — Other Ambulatory Visit (INDEPENDENT_AMBULATORY_CARE_PROVIDER_SITE_OTHER): Payer: Medicare HMO

## 2018-08-19 ENCOUNTER — Other Ambulatory Visit: Payer: Self-pay

## 2018-08-19 ENCOUNTER — Encounter: Payer: Self-pay | Admitting: Internal Medicine

## 2018-08-19 ENCOUNTER — Ambulatory Visit (INDEPENDENT_AMBULATORY_CARE_PROVIDER_SITE_OTHER): Payer: Medicare HMO | Admitting: Internal Medicine

## 2018-08-19 VITALS — BP 126/58 | HR 84 | Temp 98.2°F | Resp 16 | Ht 60.0 in | Wt 164.0 lb

## 2018-08-19 DIAGNOSIS — M1711 Unilateral primary osteoarthritis, right knee: Secondary | ICD-10-CM | POA: Diagnosis not present

## 2018-08-19 DIAGNOSIS — I10 Essential (primary) hypertension: Secondary | ICD-10-CM

## 2018-08-19 DIAGNOSIS — E119 Type 2 diabetes mellitus without complications: Secondary | ICD-10-CM | POA: Diagnosis not present

## 2018-08-19 NOTE — Patient Instructions (Signed)
Type 2 Diabetes Mellitus, Diagnosis, Adult Type 2 diabetes (type 2 diabetes mellitus) is a long-term (chronic) disease. In type 2 diabetes, one or both of these problems may be present:  The pancreas does not make enough of a hormone called insulin.  Cells in the body do not respond properly to insulin that the body makes (insulin resistance). Normally, insulin allows blood sugar (glucose) to enter cells in the body. The cells use glucose for energy. Insulin resistance or lack of insulin causes excess glucose to build up in the blood instead of going into cells. As a result, high blood glucose (hyperglycemia) develops. What increases the risk? The following factors may make you more likely to develop type 2 diabetes:  Having a family member with type 2 diabetes.  Being overweight or obese.  Having an inactive (sedentary) lifestyle.  Having been diagnosed with insulin resistance.  Having a history of prediabetes, gestational diabetes, or polycystic ovary syndrome (PCOS).  Being of American-Indian, African-American, Hispanic/Latino, or Asian/Pacific Islander descent. What are the signs or symptoms? In the early stage of this condition, you may not have symptoms. Symptoms develop slowly and may include:  Increased thirst (polydipsia).  Increased hunger(polyphagia).  Increased urination (polyuria).  Increased urination during the night (nocturia).  Unexplained weight loss.  Frequent infections that keep coming back (recurring).  Fatigue.  Weakness.  Vision changes, such as blurry vision.  Cuts or bruises that are slow to heal.  Tingling or numbness in the hands or feet.  Dark patches on the skin (acanthosis nigricans). How is this diagnosed? This condition is diagnosed based on your symptoms, your medical history, a physical exam, and your blood glucose level. Your blood glucose may be checked with one or more of the following blood tests:  A fasting blood glucose (FBG)  test. You will not be allowed to eat (you will fast) for 8 hours or longer before a blood sample is taken.  A random blood glucose test. This test checks blood glucose at any time of day regardless of when you ate.  An A1c (hemoglobin A1c) blood test. This test provides information about blood glucose control over the previous 2-3 months.  An oral glucose tolerance test (OGTT). This test measures your blood glucose at two times: ? After fasting. This is your baseline blood glucose level. ? Two hours after drinking a beverage that contains glucose. You may be diagnosed with type 2 diabetes if:  Your FBG level is 126 mg/dL (7.0 mmol/L) or higher.  Your random blood glucose level is 200 mg/dL (11.1 mmol/L) or higher.  Your A1c level is 6.5% or higher.  Your OGTT result is higher than 200 mg/dL (11.1 mmol/L). These blood tests may be repeated to confirm your diagnosis. How is this treated? Your treatment may be managed by a specialist called an endocrinologist. Type 2 diabetes may be treated by following instructions from your health care provider about:  Making diet and lifestyle changes. This may include: ? Following an individualized nutrition plan that is developed by a diet and nutrition specialist (registered dietitian). ? Exercising regularly. ? Finding ways to manage stress.  Checking your blood glucose level as often as told.  Taking diabetes medicines or insulin daily. This helps to keep your blood glucose levels in the healthy range. ? If you use insulin, you may need to adjust the dosage depending on how physically active you are and what foods you eat. Your health care provider will tell you how to adjust your dosage.    Taking medicines to help prevent complications from diabetes, such as: ? Aspirin. ? Medicine to lower cholesterol. ? Medicine to control blood pressure. Your health care provider will set individualized treatment goals for you. Your goals will be based on  your age, other medical conditions you have, and how you respond to diabetes treatment. Generally, the goal of treatment is to maintain the following blood glucose levels:  Before meals (preprandial): 80-130 mg/dL (4.4-7.2 mmol/L).  After meals (postprandial): below 180 mg/dL (10 mmol/L).  A1c level: less than 7%. Follow these instructions at home: Questions to ask your health care provider  Consider asking the following questions: ? Do I need to meet with a diabetes educator? ? Where can I find a support group for people with diabetes? ? What equipment will I need to manage my diabetes at home? ? What diabetes medicines do I need, and when should I take them? ? How often do I need to check my blood glucose? ? What number can I call if I have questions? ? When is my next appointment? General instructions  Take over-the-counter and prescription medicines only as told by your health care provider.  Keep all follow-up visits as told by your health care provider. This is important.  For more information about diabetes, visit: ? American Diabetes Association (ADA): www.diabetes.org ? American Association of Diabetes Educators (AADE): www.diabeteseducator.org Contact a health care provider if:  Your blood glucose is at or above 240 mg/dL (13.3 mmol/L) for 2 days in a row.  You have been sick or have had a fever for 2 days or longer, and you are not getting better.  You have any of the following problems for more than 6 hours: ? You cannot eat or drink. ? You have nausea and vomiting. ? You have diarrhea. Get help right away if:  Your blood glucose is lower than 54 mg/dL (3.0 mmol/L).  You become confused or you have trouble thinking clearly.  You have difficulty breathing.  You have moderate or large ketone levels in your urine. Summary  Type 2 diabetes (type 2 diabetes mellitus) is a long-term (chronic) disease. In type 2 diabetes, the pancreas does not make enough of a  hormone called insulin, or cells in the body do not respond properly to insulin that the body makes (insulin resistance).  This condition is treated by making diet and lifestyle changes and taking diabetes medicines or insulin.  Your health care provider will set individualized treatment goals for you. Your goals will be based on your age, other medical conditions you have, and how you respond to diabetes treatment.  Keep all follow-up visits as told by your health care provider. This is important. This information is not intended to replace advice given to you by your health care provider. Make sure you discuss any questions you have with your health care provider. Document Released: 02/04/2005 Document Revised: 04/04/2017 Document Reviewed: 03/10/2015 Elsevier Patient Education  2020 [employer]  

## 2018-08-19 NOTE — Progress Notes (Signed)
Subjective:  Patient ID: Deanna Watson, female    DOB: 1950-02-26  Age: 68 y.o. MRN: [id number]  CC: Hypertension and Diabetes  NEW TO ME  HPI Deanna Watson presents for f/up - She needs to establish with a new PCP.  She complains of chronic, worsening right knee pain.  She wants to be referred to an orthopedist to see if she can undergo TKR.  She tells me her blood pressure and blood sugars have been well controlled.  She is active and denies any recent episodes of CP, DOE, palpitations, edema, or fatigue.  History Ece has a past medical history of Allergy, Anemia, Arthritis, Asthma, Bell's palsy, Chicken pox, Chronic bronchitis ([place]), COPD (chronic obstructive pulmonary disease) ([place]), Depression, Diabetes mellitus without complication ([place]), Fatty liver, Fibromyalgia, GERD (gastroesophageal reflux disease), History of blood transfusion, Hyperlipidemia, Hypertension, Mild cardiomegaly, Peripheral neuropathy, PONV (postoperative nausea and vomiting), Sciatica, and WPW (Wolff-Parkinson-White syndrome).   She has a past surgical history that includes Replacement total knee (Left, 2005); Cardiac electrophysiology mapping and ablation (2009); Upper gi endoscopy; Colonoscopy; Cervical cone biopsy; Laparoscopic cholecystectomy single site with intraoperative cholangiogram (N/A, 01/23/2017); and Liver biopsy (N/A, 01/23/2017).   Her family history includes Alcohol abuse in her father; Arthritis in her mother; Breast cancer in her maternal aunt; Diabetes in her mother; Epilepsy in her father; Heart disease in her brother and mother; Hypertension in her mother; Kidney disease in her mother; Ovarian cancer in her paternal aunt; Stomach cancer in her maternal uncle and paternal uncle; Stroke in her mother.She reports that she has never smoked. She has never used smokeless tobacco. She reports that she does not drink alcohol or use drugs.  Outpatient Medications Prior to Visit  Medication Sig  Dispense Refill  . acetaminophen (TYLENOL) 500 MG tablet Take 1,000 mg by mouth 2 (two) times daily as needed for moderate pain or headache.    . albuterol (PROVENTIL) (2.5 MG/3ML) 0.083% nebulizer solution Take 3 mLs (2.5 mg total) by nebulization every 6 (six) hours as needed for wheezing or shortness of breath. 150 mL 1  . budesonide-formoterol (SYMBICORT) 160-4.5 MCG/ACT inhaler Inhale 1 puff into the lungs 2 (two) times daily.    . calcium carbonate (TUMS - DOSED IN MG ELEMENTAL CALCIUM) 500 MG chewable tablet Chew 1-2 tablets by mouth daily as needed for indigestion or heartburn.    . celecoxib (CELEBREX) 200 MG capsule Take 1 capsule (200 mg total) by mouth daily. 90 capsule 0  . cetirizine (ZYRTEC) 10 MG tablet Take 10 mg by mouth daily as needed for allergies.    . Cholecalciferol (VITAMIN D3) 5000 units CAPS Take 5,000 Units by mouth daily.    Marland Kitchen escitalopram (LEXAPRO) 20 MG tablet Take 1 tablet (20 mg total) by mouth daily. 10 tablet 0  . gabapentin (NEURONTIN) 300 MG capsule TAKE 1 CAPSULE THREE TIMES DAILY 270 capsule 0  . glucose blood test strip Use to check blood sugars 2 times daily. Dx Code- E11.41 100 each 12  . lisinopril-hydrochlorothiazide (PRINZIDE,ZESTORETIC) 20-12.5 MG tablet Take 1 tablet by mouth daily. 90 tablet 1  . lovastatin (MEVACOR) 10 MG tablet Take 1 tablet (10 mg total) by mouth at bedtime. 90 tablet 3  . Menthol (ICY HOT) 5 % PTCH Apply 1 patch topically daily as needed (pain).    . Multiple Vitamin (MULTIVITAMIN) tablet Take 1 tablet by mouth daily.    Marland Kitchen omeprazole (PRILOSEC) 20 MG capsule Take 2 capsules (40 mg total) by mouth daily. 30 capsule 0  .  empagliflozin (JARDIANCE) 25 MG TABS tablet Take 25 mg by mouth daily. 30 tablet 2  . metFORMIN (GLUCOPHAGE-XR) 500 MG 24 hr tablet Take 2 tablets (1,000 mg total) by mouth 2 (two) times daily. 180 tablet 2  . Diclofenac Sodium (PENNSAID) 2 % SOLN Place 1 application onto the skin 2 (two) times daily. 1 Bottle 3  .  traMADol (ULTRAM) 50 MG tablet Take 1 tablet (50 mg total) by mouth every 6 (six) hours as needed. 20 tablet 0  . TURMERIC PO Take by mouth daily.     Facility-Administered Medications Prior to Visit  Medication Dose Route Frequency Provider Last Rate Last Dose  . 0.9 %  sodium chloride infusion  500 mL Intravenous Continuous Armbruster, Carlota Raspberry, MD        ROS Review of Systems  Constitutional: Negative.  Negative for diaphoresis, fatigue and unexpected weight change.  HENT: Negative.   Eyes: Negative for visual disturbance.  Respiratory: Negative.  Negative for cough, chest tightness, shortness of breath and wheezing.   Cardiovascular: Negative for chest pain, palpitations and leg swelling.  Gastrointestinal: Negative for abdominal pain, constipation, diarrhea, nausea and vomiting.  Endocrine: Negative.  Negative for polydipsia, polyphagia and polyuria.  Genitourinary: Negative.  Negative for difficulty urinating.  Musculoskeletal: Positive for arthralgias. Negative for myalgias.  Skin: Negative.   Neurological: Negative.  Negative for dizziness and weakness.  Hematological: Negative for adenopathy. Does not bruise/bleed easily.  Psychiatric/Behavioral: Negative.     Objective:  BP (!) 126/58 (BP Location: Left Arm, Patient Position: Sitting, Cuff Size: Normal)   Pulse 84   Temp 98.2 F (36.8 C) (Oral)   Resp 16   Ht 5' (1.524 m)   Wt 164 lb (74.4 kg)   SpO2 96%   BMI 32.03 kg/m   Physical Exam Constitutional:      Appearance: She is obese. She is not ill-appearing or diaphoretic.  HENT:     Nose: Nose normal.     Mouth/Throat:     Mouth: Mucous membranes are moist.     Pharynx: No oropharyngeal exudate or posterior oropharyngeal erythema.  Eyes:     Conjunctiva/sclera: Conjunctivae normal.  Neck:     Musculoskeletal: Normal range of motion. No neck rigidity or muscular tenderness.  Cardiovascular:     Rate and Rhythm: Normal rate and regular rhythm.     Heart  sounds: No murmur.  Pulmonary:     Effort: Pulmonary effort is normal.     Breath sounds: No stridor. No wheezing, rhonchi or rales.  Abdominal:     General: Abdomen is protuberant.     Palpations: There is no hepatomegaly or splenomegaly.     Tenderness: There is no abdominal tenderness.     Hernia: No hernia is present.  Musculoskeletal:        General: No swelling.     Right knee: She exhibits normal range of motion, no swelling, no deformity (DJD) and no erythema. No tenderness found.     Right lower leg: No edema.     Left lower leg: No edema.  Lymphadenopathy:     Cervical: No cervical adenopathy.  Skin:    General: Skin is warm and dry.  Neurological:     General: No focal deficit present.  Psychiatric:        Mood and Affect: Mood normal.     Lab Results  Component Value Date   WBC 6.8 10/27/2017   HGB 12.2 10/27/2017   HCT 39.4 10/27/2017  PLT 195 10/27/2017   GLUCOSE 183 (H) 08/19/2018   CHOL 175 03/06/2018   TRIG 80.0 03/06/2018   HDL 77.40 03/06/2018   LDLCALC 81 03/06/2018   ALT 13 03/06/2018   AST 20 03/06/2018   NA 140 08/19/2018   K 3.7 08/19/2018   CL 102 08/19/2018   CREATININE 0.81 08/19/2018   BUN 18 08/19/2018   CO2 28 08/19/2018   TSH 0.75 08/13/2017   HGBA1C 7.1 (H) 08/19/2018   MICROALBUR 15.4 (H) 03/06/2018    Assessment & Plan:   Patrice was seen today for hypertension and diabetes.  Diagnoses and all orders for this visit:  Essential hypertension- His blood pressure is adequately well controlled.  I will monitor her electrolytes and renal function. -     Basic metabolic panel; Future  Type 2 diabetes mellitus without complication, without long-term current use of insulin ([place])- Her A1c is at 7.1%.  Her blood sugars are adequately well controlled.  Will continue the combination of metformin and an SGLT2 inhibitor. -     Ambulatory referral to Ophthalmology -     HM Diabetes Foot Exam -     Basic metabolic panel; Future -      Hemoglobin A1c; Future -     metFORMIN (GLUCOPHAGE-XR) 500 MG 24 hr tablet; Take 2 tablets (1,000 mg total) by mouth 2 (two) times daily. -     empagliflozin (JARDIANCE) 25 MG TABS tablet; Take 25 mg by mouth daily.  Primary osteoarthritis of right knee -     Ambulatory referral to Orthopedic Surgery   I have discontinued Zanayah Yeh's TURMERIC PO, Diclofenac Sodium, and traMADol. I am also having her maintain her multivitamin, budesonide-formoterol, omeprazole, calcium carbonate, Vitamin D3, acetaminophen, cetirizine, Menthol, glucose blood, lovastatin, gabapentin, celecoxib, lisinopril-hydrochlorothiazide, albuterol, escitalopram, metFORMIN, and Jardiance. We will continue to administer sodium chloride.  Meds ordered this encounter  Medications  . metFORMIN (GLUCOPHAGE-XR) 500 MG 24 hr tablet    Sig: Take 2 tablets (1,000 mg total) by mouth 2 (two) times daily.    Dispense:  180 tablet    Refill:  1  . empagliflozin (JARDIANCE) 25 MG TABS tablet    Sig: Take 25 mg by mouth daily.    Dispense:  90 tablet    Refill:  1     Follow-up: Return in about 6 months (around 02/19/2019).  Scarlette Calico, MD

## 2018-08-20 ENCOUNTER — Encounter: Payer: Self-pay | Admitting: Internal Medicine

## 2018-08-20 LAB — BASIC METABOLIC PANEL
BUN: 18 mg/dL (ref 6–23)
CO2: 28 mEq/L (ref 19–32)
Calcium: 9.3 mg/dL (ref 8.4–10.5)
Chloride: 102 mEq/L (ref 96–112)
Creatinine, Ser: 0.81 mg/dL (ref 0.40–1.20)
GFR: 70.04 mL/min (ref >60.00)
Glucose, Bld: 183 mg/dL — ABNORMAL HIGH (ref 70–99)
Potassium: 3.7 mEq/L (ref 3.5–5.1)
Sodium: 140 mEq/L (ref 135–145)

## 2018-08-20 LAB — HEMOGLOBIN A1C: Hgb A1c MFr Bld: 7.1 % — ABNORMAL HIGH (ref 4.6–6.5)

## 2018-08-20 MED ORDER — JARDIANCE 25 MG PO TABS: 25.0000 mg | ORAL_TABLET | Freq: Every day | ORAL | 1 refills | Status: DC

## 2018-08-20 MED ORDER — METFORMIN HCL ER 500 MG PO TB24: 1000.0000 mg | ORAL_TABLET | Freq: Two times a day (BID) | ORAL | 1 refills | Status: DC

## 2018-08-31 ENCOUNTER — Ambulatory Visit: Payer: Medicare HMO | Admitting: Nurse Practitioner

## 2018-09-02 LAB — HM DIABETES EYE EXAM

## 2018-09-08 ENCOUNTER — Other Ambulatory Visit: Payer: Self-pay | Admitting: Internal Medicine

## 2018-09-08 DIAGNOSIS — E119 Type 2 diabetes mellitus without complications: Secondary | ICD-10-CM

## 2018-09-21 ENCOUNTER — Encounter (HOSPITAL_COMMUNITY): Payer: Self-pay | Admitting: Family Medicine

## 2018-09-21 ENCOUNTER — Other Ambulatory Visit: Payer: Self-pay

## 2018-09-21 ENCOUNTER — Ambulatory Visit (HOSPITAL_COMMUNITY)
Admission: EM | Admit: 2018-09-21 | Discharge: 2018-09-21 | Disposition: A | Discharge status: Home / Self Care | Payer: Medicare HMO | Attending: Family Medicine | Admitting: Family Medicine

## 2018-09-21 DIAGNOSIS — E119 Type 2 diabetes mellitus without complications: Secondary | ICD-10-CM

## 2018-09-21 DIAGNOSIS — W540XXA Bitten by dog, initial encounter: Secondary | ICD-10-CM | POA: Diagnosis not present

## 2018-09-21 DIAGNOSIS — L03032 Cellulitis of left toe: Secondary | ICD-10-CM | POA: Diagnosis not present

## 2018-09-21 DIAGNOSIS — M79672 Pain in left foot: Secondary | ICD-10-CM | POA: Diagnosis not present

## 2018-09-21 MED ORDER — AMOXICILLIN-POT CLAVULANATE 875-125 MG PO TABS: 1.0000 | ORAL_TABLET | Freq: Two times a day (BID) | ORAL | 0 refills | Status: DC

## 2018-09-21 NOTE — Discharge Instructions (Signed)
Wash the left foot gently in soapy water twice a day.  Return if toe becomes more swollen and tender.

## 2018-09-21 NOTE — ED Triage Notes (Signed)
Pt here with dog bite to left second toe with pain on Saturday; pt sts dog is UTD on all vaccines

## 2018-09-21 NOTE — ED Provider Notes (Signed)
[facility]    CSN: [id number] Arrival date & time: 09/21/18  2637     History   Chief Complaint Chief Complaint  Patient presents with  . Animal Bite    HPI Deanna Watson is a 68 y.o. female.   Established [facility] patient  Pt here with dog bite to left second toe with pain on Saturday; pt sts dog is UTD on all vaccines   Patient is a diabetic.  She cares for her chronically ill husband. Toe is becoming progressively more tender     Past Medical History:  Diagnosis Date  . Allergy   . Anemia    history of  . Arthritis   . Asthma   . Bell's palsy    lasted only about 2 days  . Chicken pox   . Chronic bronchitis ([place])   . COPD (chronic obstructive pulmonary disease) ([place])   . Depression   . Diabetes mellitus without complication ([place])   . Fatty liver    mild  . Fibromyalgia   . GERD (gastroesophageal reflux disease)   . History of blood transfusion   . Hyperlipidemia   . Hypertension   . Mild cardiomegaly   . Peripheral neuropathy   . PONV (postoperative nausea and vomiting)   . Sciatica   . WPW (Wolff-Parkinson-White syndrome)     Patient Active Problem List   Diagnosis Date Noted  . WPW (Wolff-Parkinson-White syndrome) 04/09/2018  . PVD (peripheral vascular disease) ([place]) 04/09/2018  . Diabetic mononeuropathy associated with type 2 diabetes mellitus ([place]) 03/04/2018  . OA (osteoarthritis) of knee 11/02/2017  . 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 Diabetic retinopathy ([place]) 06/12/2017  . Chronic cholecystitis with calculus 01/23/2017  . Peripheral neuropathy 12/11/2016  . Tubular adenoma of colon 10/15/2016  . Medicare annual wellness visit, subsequent 04/04/2016  . Routine adult health maintenance 04/04/2016  . Type 2 diabetes mellitus without complication, without long-term current use of insulin ([place]) 03/01/2016  . Essential hypertension 03/01/2016  . Mild episode of recurrent major depressive disorder ([place]) 03/01/2016  .  Mixed hyperlipidemia 03/01/2016    Past Surgical History:  Procedure Laterality Date  . CARDIAC ELECTROPHYSIOLOGY [place] AND ABLATION  2009  . CERVICAL [place] BIOPSY    . COLONOSCOPY    . LAPAROSCOPIC CHOLECYSTECTOMY SINGLE SITE WITH INTRAOPERATIVE CHOLANGIOGRAM N/A 01/23/2017   Procedure: LAPAROSCOPIC CHOLECYSTECTOMY SINGLE SITE WITH INTRAOPERATIVE CHOLANGIOGRAM ERAS PATHWAY;  Surgeon: Michael Boston, MD;  Location: [facility];  Service: General;  Laterality: N/A;  . LIVER BIOPSY N/A 01/23/2017   Procedure: BIOPSY OF LIVER;  Surgeon: Michael Boston, MD;  Location: [facility];  Service: General;  Laterality: N/A;  . REPLACEMENT TOTAL KNEE Left 2005  . UPPER GI ENDOSCOPY      OB History   No obstetric history on file.      Home Medications    Prior to Admission medications   Medication Sig Start Date End Date Taking? Authorizing Provider  acetaminophen (TYLENOL) 500 MG tablet Take 1,000 mg by mouth 2 (two) times daily as needed for moderate pain or headache.    [provider]  albuterol (PROVENTIL) (2.5 MG/3ML) 0.083% nebulizer solution Take 3 mLs (2.5 mg total) by nebulization every 6 (six) hours as needed for wheezing or shortness of breath. 03/06/18   Lance Sell, NP  amoxicillin-clavulanate (AUGMENTIN) 875-125 MG tablet Take 1 tablet by mouth every 12 (twelve) hours. 09/21/18   Robyn Haber, MD  budesonide-formoterol (SYMBICORT) 160-4.5 MCG/ACT inhaler Inhale 1 puff into the  lungs 2 (two) times daily.    [provider]  calcium carbonate (TUMS - DOSED IN MG ELEMENTAL CALCIUM) 500 MG chewable tablet Chew 1-2 tablets by mouth daily as needed for indigestion or heartburn.    [provider]  celecoxib (CELEBREX) 200 MG capsule Take 1 capsule (200 mg total) by mouth daily. 03/04/18   Lance Sell, NP  cetirizine (ZYRTEC) 10 MG tablet Take 10 mg by mouth daily as needed for allergies.    [provider]  Cholecalciferol (VITAMIN D3) 5000 units  CAPS Take 5,000 Units by mouth daily.    [provider]  escitalopram (LEXAPRO) 20 MG tablet Take 1 tablet (20 mg total) by mouth daily. 07/03/18   Plotnikov, Evie Lacks, MD  gabapentin (NEURONTIN) 300 MG capsule TAKE 1 CAPSULE THREE TIMES DAILY 03/04/18   Lance Sell, NP  glucose blood test strip Use to check blood sugars 2 times daily. Dx Code- E11.41 03/13/17   Lance Sell, NP  JARDIANCE 25 MG TABS tablet Take 1 tablet by mouth once daily 09/08/18   Plotnikov, Evie Lacks, MD  lisinopril-hydrochlorothiazide (PRINZIDE,ZESTORETIC) 20-12.5 MG tablet Take 1 tablet by mouth daily. 03/04/18   Lance Sell, NP  lovastatin (MEVACOR) 10 MG tablet Take 1 tablet (10 mg total) by mouth at bedtime. 03/04/18   Lance Sell, NP  Menthol (ICY HOT) 5 % PTCH Apply 1 patch topically daily as needed (pain).    [provider]  metFORMIN (GLUCOPHAGE-XR) 500 MG 24 hr tablet Take 2 tablets (1,000 mg total) by mouth 2 (two) times daily. 08/20/18   Janith Lima, MD  Multiple Vitamin (MULTIVITAMIN) tablet Take 1 tablet by mouth daily.    [provider]  omeprazole (PRILOSEC) 20 MG capsule Take 2 capsules (40 mg total) by mouth daily. 12/11/16   Armbruster, Carlota Raspberry, MD    Family History Family History  Problem Relation Age of Onset  . Arthritis Mother   . Heart disease Mother        CHF age 52  . Stroke Mother   . Hypertension Mother   . Kidney disease Mother   . Diabetes Mother   . Alcohol abuse Father   . Epilepsy Father   . Heart disease Brother        Died in his sleep age 34.    Marland Kitchen Ovarian cancer Paternal Aunt   . Stomach cancer Paternal Uncle   . Breast cancer Maternal Aunt   . Stomach cancer Maternal Uncle   . Colon cancer Neg Hx     Social History Social History   Tobacco Use  . Smoking status: Never Smoker  . Smokeless tobacco: Never Used  Substance Use Topics  . Alcohol use: No  . Drug use: No     Allergies   Patient has no known  allergies.   Review of Systems Review of Systems   Physical Exam Triage Vital Signs ED Triage Vitals [09/21/18 1035]  Enc Vitals Group     BP (!) 145/69     Pulse Rate 73     Resp 18     Temp 98 F (36.7 C)     Temp Source Oral     SpO2 96 %     Weight      Height      Head Circumference      Peak Flow      Pain Score 8     Pain Loc  Pain Edu?      Excl. in [place]?    No data found.  Updated Vital Signs BP (!) 145/69 (BP Location: Right Arm)   Pulse 73   Temp 98 F (36.7 C) (Oral)   Resp 18   SpO2 96%    Physical Exam Vitals signs and nursing note reviewed.  Constitutional:      Appearance: Normal appearance.  Eyes:     Conjunctiva/sclera: Conjunctivae normal.  Neck:     Musculoskeletal: Normal range of motion and neck supple.  Pulmonary:     Effort: Pulmonary effort is normal.  Musculoskeletal:        General: Swelling, tenderness, deformity and signs of injury present.     Comments: Left 2nd toe is a hammer toe with swelling, tenderness and small plantar laceration.  Skin:    General: Skin is warm and dry.     Findings: Erythema present.  Neurological:     General: No focal deficit present.     Mental Status: She is alert.  Psychiatric:        Mood and Affect: Mood normal.        UC Treatments / Results  Labs (all labs ordered are listed, but only abnormal results are displayed) Labs Reviewed - No data to display  EKG   Radiology No results found.  Procedures Procedures (including critical care time)  Medications Ordered in UC Medications - No data to display  Initial Impression / Assessment and Plan / UC Course  I have reviewed the triage vital signs and the nursing notes.  Pertinent labs & imaging results that were available during my care of the patient were reviewed by me and considered in my medical decision making (see chart for details).    Final Clinical Impressions(s) / UC Diagnoses   Final diagnoses:  Dog bite,  initial encounter  Cellulitis of toe of left foot     Discharge Instructions     Wash the left foot gently in soapy water twice a day.  Return if toe becomes more swollen and tender.    ED Prescriptions    Medication Sig Dispense Auth. Provider   amoxicillin-clavulanate (AUGMENTIN) 875-125 MG tablet Take 1 tablet by mouth every 12 (twelve) hours. 14 tablet Robyn Haber, MD     Controlled Substance Prescriptions [place] Controlled Substance Registry consulted? Not Applicable   Robyn Haber, MD 09/21/18 1052

## 2018-09-23 ENCOUNTER — Other Ambulatory Visit: Payer: Self-pay | Admitting: Internal Medicine

## 2018-09-23 DIAGNOSIS — I10 Essential (primary) hypertension: Secondary | ICD-10-CM

## 2018-09-23 DIAGNOSIS — E119 Type 2 diabetes mellitus without complications: Secondary | ICD-10-CM

## 2018-09-23 DIAGNOSIS — E1141 Type 2 diabetes mellitus with diabetic mononeuropathy: Secondary | ICD-10-CM

## 2018-09-23 DIAGNOSIS — M1711 Unilateral primary osteoarthritis, right knee: Secondary | ICD-10-CM

## 2018-09-23 MED ORDER — METFORMIN HCL ER 500 MG PO TB24: 1000.0000 mg | ORAL_TABLET | Freq: Two times a day (BID) | ORAL | 1 refills | Status: DC

## 2018-09-23 MED ORDER — LISINOPRIL-HYDROCHLOROTHIAZIDE 20-12.5 MG PO TABS: 1.0000 | ORAL_TABLET | Freq: Every day | ORAL | 1 refills | Status: DC

## 2018-09-23 MED ORDER — GABAPENTIN 300 MG PO CAPS: ORAL_CAPSULE | ORAL | 1 refills | Status: DC

## 2018-09-23 MED ORDER — CELECOXIB 200 MG PO CAPS: 200.0000 mg | ORAL_CAPSULE | Freq: Every day | ORAL | 1 refills | Status: DC

## 2018-09-23 MED ORDER — JARDIANCE 25 MG PO TABS: 25.0000 mg | ORAL_TABLET | Freq: Every day | ORAL | 1 refills | Status: DC

## 2018-09-25 ENCOUNTER — Other Ambulatory Visit: Payer: Self-pay | Admitting: Internal Medicine

## 2018-09-25 DIAGNOSIS — M1711 Unilateral primary osteoarthritis, right knee: Secondary | ICD-10-CM

## 2018-09-25 DIAGNOSIS — E119 Type 2 diabetes mellitus without complications: Secondary | ICD-10-CM

## 2018-09-25 DIAGNOSIS — E1141 Type 2 diabetes mellitus with diabetic mononeuropathy: Secondary | ICD-10-CM

## 2018-09-25 DIAGNOSIS — I10 Essential (primary) hypertension: Secondary | ICD-10-CM

## 2018-09-25 MED ORDER — CELECOXIB 200 MG PO CAPS: 200.0000 mg | ORAL_CAPSULE | Freq: Every day | ORAL | 1 refills | Status: DC

## 2018-09-25 MED ORDER — METFORMIN HCL ER 500 MG PO TB24: 1000.0000 mg | ORAL_TABLET | Freq: Two times a day (BID) | ORAL | 1 refills | Status: AC

## 2018-09-25 MED ORDER — LISINOPRIL-HYDROCHLOROTHIAZIDE 20-12.5 MG PO TABS: 1.0000 | ORAL_TABLET | Freq: Every day | ORAL | 1 refills | Status: AC

## 2018-09-25 MED ORDER — GABAPENTIN 300 MG PO CAPS: ORAL_CAPSULE | ORAL | 1 refills | Status: DC

## 2018-10-06 DIAGNOSIS — M1711 Unilateral primary osteoarthritis, right knee: Secondary | ICD-10-CM | POA: Diagnosis not present

## 2018-10-06 DIAGNOSIS — M79675 Pain in left toe(s): Secondary | ICD-10-CM | POA: Diagnosis not present

## 2018-10-09 ENCOUNTER — Other Ambulatory Visit: Payer: Self-pay | Admitting: Internal Medicine

## 2018-10-09 DIAGNOSIS — E119 Type 2 diabetes mellitus without complications: Secondary | ICD-10-CM

## 2018-10-14 DIAGNOSIS — M79675 Pain in left toe(s): Secondary | ICD-10-CM | POA: Diagnosis not present

## 2018-10-16 ENCOUNTER — Telehealth: Payer: Self-pay

## 2018-10-16 NOTE — Telephone Encounter (Signed)
   [facility] Pre-operative Risk Assessment    Request for surgical clearance:  1. What type of surgery is being performed? Right Knee Arthroplasty under Spinal anesthesia   2. When is this surgery scheduled? tbd   3. What type of clearance is required (medical clearance vs. Pharmacy clearance to hold med vs. Both)? medical  4. Are there any medications that need to be held prior to surgery and how long?none   5. Practice name and name of physician performing surgery? [facility]    6. What is your office phone number: [phone]    7.   What is your office fax number [phone]  8.   Anesthesia type (None, local, MAC, general) ? spinal   Deanna Watson 10/16/2018, 4:12 PM  _________________________________________________________________   (provider comments below)

## 2018-10-19 NOTE — Telephone Encounter (Signed)
   Primary Cardiologist: Buford Dresser, MD  Chart reviewed as part of pre-operative protocol coverage. Patient was contacted 10/19/2018 in reference to pre-operative risk assessment for pending surgery as outlined below.  Deanna Watson was last seen in Feb 2020 by Dr. Percival Spanish for preop clearance for the procedure listed below and was cleared to undergo knee surgery w/o need for further cardiac testing but surgery canceled due to COVID 19 pandemic. Surgery has now been rescheduled  Since that day, Deanna Watson has done well w/o any anginal symptomatology. No new cardiac symptoms since last visit.   Therefore, based on ACC/AHA guidelines, the patient would be at acceptable risk for the planned procedure without further cardiovascular testing.   I will route this recommendation to the requesting party via Epic fax function and remove from pre-op pool.  Please call with questions.  Lyda Jester, PA-C 10/19/2018, 10:44 AM

## 2018-10-20 DIAGNOSIS — M79672 Pain in left foot: Secondary | ICD-10-CM | POA: Diagnosis not present

## 2018-10-27 DIAGNOSIS — M1711 Unilateral primary osteoarthritis, right knee: Secondary | ICD-10-CM | POA: Diagnosis not present

## 2018-11-03 ENCOUNTER — Other Ambulatory Visit: Payer: Self-pay | Admitting: Orthopedic Surgery

## 2018-11-05 ENCOUNTER — Ambulatory Visit: Payer: Medicare HMO | Admitting: Internal Medicine

## 2018-11-08 ENCOUNTER — Other Ambulatory Visit: Payer: Self-pay | Admitting: Internal Medicine

## 2018-11-08 DIAGNOSIS — E119 Type 2 diabetes mellitus without complications: Secondary | ICD-10-CM

## 2018-11-12 ENCOUNTER — Encounter: Payer: Self-pay | Admitting: Internal Medicine

## 2018-11-12 ENCOUNTER — Ambulatory Visit (INDEPENDENT_AMBULATORY_CARE_PROVIDER_SITE_OTHER): Payer: Medicare HMO | Admitting: Internal Medicine

## 2018-11-12 ENCOUNTER — Other Ambulatory Visit: Payer: Self-pay

## 2018-11-12 VITALS — BP 140/72 | HR 78 | Temp 98.1°F | Resp 16 | Ht 60.0 in | Wt 165.0 lb

## 2018-11-12 DIAGNOSIS — E119 Type 2 diabetes mellitus without complications: Secondary | ICD-10-CM

## 2018-11-12 DIAGNOSIS — Z01818 Encounter for other preprocedural examination: Secondary | ICD-10-CM

## 2018-11-12 DIAGNOSIS — Z23 Encounter for immunization: Secondary | ICD-10-CM | POA: Diagnosis not present

## 2018-11-12 LAB — POCT GLYCOSYLATED HEMOGLOBIN (HGB A1C): Hemoglobin A1C: 6.8 % — AB (ref 4.0–5.6)

## 2018-11-12 NOTE — Patient Instructions (Signed)
Type 2 Diabetes Mellitus, Diagnosis, Adult Type 2 diabetes (type 2 diabetes mellitus) is a long-term (chronic) disease. In type 2 diabetes, one or both of these problems may be present:  The pancreas does not make enough of a hormone called insulin.  Cells in the body do not respond properly to insulin that the body makes (insulin resistance). Normally, insulin allows blood sugar (glucose) to enter cells in the body. The cells use glucose for energy. Insulin resistance or lack of insulin causes excess glucose to build up in the blood instead of going into cells. As a result, high blood glucose (hyperglycemia) develops. What increases the risk? The following factors may make you more likely to develop type 2 diabetes:  Having a family member with type 2 diabetes.  Being overweight or obese.  Having an inactive (sedentary) lifestyle.  Having been diagnosed with insulin resistance.  Having a history of prediabetes, gestational diabetes, or polycystic ovary syndrome (PCOS).  Being of American-Indian, African-American, Hispanic/Latino, or Asian/Pacific Islander descent. What are the signs or symptoms? In the early stage of this condition, you may not have symptoms. Symptoms develop slowly and may include:  Increased thirst (polydipsia).  Increased hunger(polyphagia).  Increased urination (polyuria).  Increased urination during the night (nocturia).  Unexplained weight loss.  Frequent infections that keep coming back (recurring).  Fatigue.  Weakness.  Vision changes, such as blurry vision.  Cuts or bruises that are slow to heal.  Tingling or numbness in the hands or feet.  Dark patches on the skin (acanthosis nigricans). How is this diagnosed? This condition is diagnosed based on your symptoms, your medical history, a physical exam, and your blood glucose level. Your blood glucose may be checked with one or more of the following blood tests:  A fasting blood glucose (FBG)  test. You will not be allowed to eat (you will fast) for 8 hours or longer before a blood sample is taken.  A random blood glucose test. This test checks blood glucose at any time of day regardless of when you ate.  An A1c (hemoglobin A1c) blood test. This test provides information about blood glucose control over the previous 2-3 months.  An oral glucose tolerance test (OGTT). This test measures your blood glucose at two times: ? After fasting. This is your baseline blood glucose level. ? Two hours after drinking a beverage that contains glucose. You may be diagnosed with type 2 diabetes if:  Your FBG level is 126 mg/dL (7.0 mmol/L) or higher.  Your random blood glucose level is 200 mg/dL (11.1 mmol/L) or higher.  Your A1c level is 6.5% or higher.  Your OGTT result is higher than 200 mg/dL (11.1 mmol/L). These blood tests may be repeated to confirm your diagnosis. How is this treated? Your treatment may be managed by a specialist called an endocrinologist. Type 2 diabetes may be treated by following instructions from your health care provider about:  Making diet and lifestyle changes. This may include: ? Following an individualized nutrition plan that is developed by a diet and nutrition specialist (registered dietitian). ? Exercising regularly. ? Finding ways to manage stress.  Checking your blood glucose level as often as told.  Taking diabetes medicines or insulin daily. This helps to keep your blood glucose levels in the healthy range. ? If you use insulin, you may need to adjust the dosage depending on how physically active you are and what foods you eat. Your health care provider will tell you how to adjust your dosage.    Taking medicines to help prevent complications from diabetes, such as: ? Aspirin. ? Medicine to lower cholesterol. ? Medicine to control blood pressure. Your health care provider will set individualized treatment goals for you. Your goals will be based on  your age, other medical conditions you have, and how you respond to diabetes treatment. Generally, the goal of treatment is to maintain the following blood glucose levels:  Before meals (preprandial): 80-130 mg/dL (4.4-7.2 mmol/L).  After meals (postprandial): below 180 mg/dL (10 mmol/L).  A1c level: less than 7%. Follow these instructions at home: Questions to ask your health care provider  Consider asking the following questions: ? Do I need to meet with a diabetes educator? ? Where can I find a support group for people with diabetes? ? What equipment will I need to manage my diabetes at home? ? What diabetes medicines do I need, and when should I take them? ? How often do I need to check my blood glucose? ? What number can I call if I have questions? ? When is my next appointment? General instructions  Take over-the-counter and prescription medicines only as told by your health care provider.  Keep all follow-up visits as told by your health care provider. This is important.  For more information about diabetes, visit: ? American Diabetes Association (ADA): www.diabetes.org ? American Association of Diabetes Educators (AADE): www.diabeteseducator.org Contact a health care provider if:  Your blood glucose is at or above 240 mg/dL (13.3 mmol/L) for 2 days in a row.  You have been sick or have had a fever for 2 days or longer, and you are not getting better.  You have any of the following problems for more than 6 hours: ? You cannot eat or drink. ? You have nausea and vomiting. ? You have diarrhea. Get help right away if:  Your blood glucose is lower than 54 mg/dL (3.0 mmol/L).  You become confused or you have trouble thinking clearly.  You have difficulty breathing.  You have moderate or large ketone levels in your urine. Summary  Type 2 diabetes (type 2 diabetes mellitus) is a long-term (chronic) disease. In type 2 diabetes, the pancreas does not make enough of a  hormone called insulin, or cells in the body do not respond properly to insulin that the body makes (insulin resistance).  This condition is treated by making diet and lifestyle changes and taking diabetes medicines or insulin.  Your health care provider will set individualized treatment goals for you. Your goals will be based on your age, other medical conditions you have, and how you respond to diabetes treatment.  Keep all follow-up visits as told by your health care provider. This is important. This information is not intended to replace advice given to you by your health care provider. Make sure you discuss any questions you have with your health care provider. Document Released: 02/04/2005 Document Revised: 04/04/2017 Document Reviewed: 03/10/2015 Elsevier Patient Education  2020 [employer]  

## 2018-11-12 NOTE — Progress Notes (Signed)
Subjective:  Patient ID: Deanna Watson, female    DOB: [birth year]  Age: 68 y.o. MRN: [id number]  CC: Osteoarthritis and Diabetes   HPI Keyoshia Eatmon presents for f/up -she comes in for surgical clearance and diabetes follow-up.  She is slated to have a right total knee replacement with spinal anesthesia in the next monthpin.  She tells me her blood sugars have been well controlled and she denies any recent episodes of CP, DOE, palpitations, edema, or fatigue.  She had a normal EKG about 6 months ago.  Outpatient Medications Prior to Visit  Medication Sig Dispense Refill  . acetaminophen (TYLENOL) 500 MG tablet Take 1,000 mg by mouth 2 (two) times daily as needed for moderate pain or headache.    . albuterol (PROVENTIL) (2.5 MG/3ML) 0.083% nebulizer solution Take 3 mLs (2.5 mg total) by nebulization every 6 (six) hours as needed for wheezing or shortness of breath. 150 mL 1  . budesonide-formoterol (SYMBICORT) 160-4.5 MCG/ACT inhaler Inhale 1 puff into the lungs 2 (two) times daily.    . calcium carbonate (TUMS - DOSED IN MG ELEMENTAL CALCIUM) 500 MG chewable tablet Chew 1-2 tablets by mouth daily as needed for indigestion or heartburn.    . celecoxib (CELEBREX) 200 MG capsule Take 1 capsule (200 mg total) by mouth daily. (Patient taking differently: Take 200 mg by mouth 2 (two) times daily. ) 90 capsule 1  . cetirizine (ZYRTEC) 10 MG tablet Take 10 mg by mouth daily as needed for allergies.    . Cholecalciferol (VITAMIN D3) 5000 units CAPS Take 5,000 Units by mouth daily.    Marland Kitchen escitalopram (LEXAPRO) 20 MG tablet Take 1 tablet (20 mg total) by mouth daily. 10 tablet 0  . gabapentin (NEURONTIN) 300 MG capsule TAKE 1 CAPSULE THREE TIMES DAILY 270 capsule 1  . glucose blood test strip Use to check blood sugars 2 times daily. Dx Code- E11.41 100 each 12  . JARDIANCE 25 MG TABS tablet Take 1 tablet by mouth once daily 90 tablet 1  . lisinopril-hydrochlorothiazide (ZESTORETIC) 20-12.5 MG tablet  Take 1 tablet by mouth daily. 90 tablet 1  . lovastatin (MEVACOR) 10 MG tablet Take 1 tablet (10 mg total) by mouth at bedtime. 90 tablet 3  . metFORMIN (GLUCOPHAGE-XR) 500 MG 24 hr tablet Take 2 tablets (1,000 mg total) by mouth 2 (two) times daily. 180 tablet 1  . Multiple Vitamin (MULTIVITAMIN) tablet Take 1 tablet by mouth daily.    Marland Kitchen omeprazole (PRILOSEC) 20 MG capsule Take 2 capsules (40 mg total) by mouth daily. 30 capsule 0  . amoxicillin-clavulanate (AUGMENTIN) 875-125 MG tablet Take 1 tablet by mouth every 12 (twelve) hours. (Patient not taking: Reported on 11/12/2018) 14 tablet 0   Facility-Administered Medications Prior to Visit  Medication Dose Route Frequency Provider Last Rate Last Dose  . 0.9 %  sodium chloride infusion  500 mL Intravenous Continuous Armbruster, Carlota Raspberry, MD        ROS Review of Systems  Constitutional: Negative for diaphoresis, fatigue and unexpected weight change.  HENT: Negative.   Eyes: Negative for visual disturbance.  Respiratory: Negative for cough, chest tightness, shortness of breath and wheezing.   Cardiovascular: Negative for chest pain, palpitations and leg swelling.  Gastrointestinal: Negative for abdominal pain, constipation, diarrhea and vomiting.  Endocrine: Negative.  Negative for polydipsia, polyphagia and polyuria.  Genitourinary: Negative for difficulty urinating.  Musculoskeletal: Positive for arthralgias. Negative for myalgias.  Skin: Negative.  Negative for color change and pallor.  Neurological: Negative.   Hematological: Negative for adenopathy. Does not bruise/bleed easily.  Psychiatric/Behavioral: Negative.     Objective:  BP 140/72 (BP Location: Left Arm, Patient Position: Sitting, Cuff Size: Normal)   Pulse 78   Temp 98.1 F (36.7 C) (Oral)   Resp 16   Ht 5' (1.524 m)   Wt 165 lb (74.8 kg)   SpO2 97%   BMI 32.22 kg/m   BP Readings from Last 3 Encounters:  11/12/18 140/72  09/21/18 (!) 145/69  08/19/18 (!) 126/58     Wt Readings from Last 3 Encounters:  11/12/18 165 lb (74.8 kg)  08/19/18 164 lb (74.4 kg)  05/14/18 167 lb (75.8 kg)    Physical Exam Vitals signs reviewed.  Constitutional:      Appearance: She is not ill-appearing or diaphoretic.  HENT:     Nose: Nose normal.     Mouth/Throat:     Mouth: Mucous membranes are moist.  Eyes:     General: No scleral icterus.    Conjunctiva/sclera: Conjunctivae normal.  Neck:     Musculoskeletal: Normal range of motion and neck supple.  Cardiovascular:     Rate and Rhythm: Normal rate and regular rhythm.     Heart sounds: No murmur. No gallop.   Pulmonary:     Effort: Pulmonary effort is normal.     Breath sounds: No stridor. No wheezing, rhonchi or rales.  Abdominal:     General: Abdomen is flat. Bowel sounds are normal. There is no distension.     Palpations: There is no hepatomegaly or splenomegaly.     Tenderness: There is no abdominal tenderness.  Musculoskeletal: Normal range of motion.     Right lower leg: No edema.     Left lower leg: No edema.  Skin:    General: Skin is warm and dry.     Coloration: Skin is not pale.  Neurological:     General: No focal deficit present.  Psychiatric:        Mood and Affect: Mood normal.        Behavior: Behavior normal.     Lab Results  Component Value Date   WBC 6.8 10/27/2017   HGB 12.2 10/27/2017   HCT 39.4 10/27/2017   PLT 195 10/27/2017   GLUCOSE 183 (H) 08/19/2018   CHOL 175 03/06/2018   TRIG 80.0 03/06/2018   HDL 77.40 03/06/2018   LDLCALC 81 03/06/2018   ALT 13 03/06/2018   AST 20 03/06/2018   NA 140 08/19/2018   K 3.7 08/19/2018   CL 102 08/19/2018   CREATININE 0.81 08/19/2018   BUN 18 08/19/2018   CO2 28 08/19/2018   TSH 0.75 08/13/2017   HGBA1C 6.8 (A) 11/12/2018   MICROALBUR 15.4 (H) 03/06/2018    No results found.  Assessment & Plan:   Maylie was seen today for osteoarthritis and diabetes.  Diagnoses and all orders for this visit:  Need for influenza  vaccination -     Flu Vaccine QUAD High Dose(Fluad)  Type 2 diabetes mellitus without complication, without long-term current use of insulin ([place])- Her blood sugars are adequately well controlled.  Will continue the current regimen. -     POCT glycosylated hemoglobin (Hb A1C)  Pre-op evaluation- Her MICA score 0.2%.  She is low risk.  I have completed the form for her to proceed with the total knee replacement.   I have discontinued Rosine Almeda's amoxicillin-clavulanate. I am also having her maintain her multivitamin, budesonide-formoterol, omeprazole, calcium  carbonate, Vitamin D3, acetaminophen, cetirizine, glucose blood, lovastatin, albuterol, escitalopram, gabapentin, lisinopril-hydrochlorothiazide, metFORMIN, celecoxib, and Jardiance. We will continue to administer sodium chloride.  No orders of the defined types were placed in this encounter.    Follow-up: Return in about 6 months (around 05/12/2019).  Scarlette Calico, MD

## 2018-11-13 DIAGNOSIS — Z01818 Encounter for other preprocedural examination: Secondary | ICD-10-CM | POA: Insufficient documentation

## 2018-11-17 DIAGNOSIS — M79672 Pain in left foot: Secondary | ICD-10-CM | POA: Diagnosis not present

## 2018-11-19 NOTE — Patient Instructions (Addendum)
DUE TO COVID-19 ONLY ONE VISITOR IS ALLOWED TO COME WITH YOU AND STAY IN THE WAITING ROOM ONLY DURING PRE OP AND PROCEDURE DAY OF SURGERY.  THE 1 VISITOR MAY VISIT WITH YOU AFTER SURGERY IN YOUR PRIVATE ROOM DURING VISITING HOURS ONLY!  YOU NEED TO HAVE A COVID 19 TEST ON_10/8______ @_2 :55 pm______, THIS TEST MUST BE DONE BEFORE SURGERY, COME  Deanna Watson , [id number].  ([place])  ONCE YOUR COVID TEST IS COMPLETED, PLEASE BEGIN THE QUARANTINE INSTRUCTIONS AS OUTLINED IN YOUR HANDOUT.                Deanna Watson   Your procedure is scheduled on: 11/30/18   Report to [facility] Main  Entrance   Report to admitting at  7:30 AM     Call this number if you have problems the morning of surgery [place], NO CHEWING GUM [place].  Do not eat food After Midnight.   YOU MAY HAVE CLEAR LIQUIDS FROM MIDNIGHT UNTIL 7:00 AM   At 7:00 AM Please finish the prescribed Pre-Surgery Gatorade drink.   Nothing by mouth after you finish the Gatorade drink !     Take these medicines the morning of surgery with A SIP OF WATER: Gabapentin ,Lexipro use sybicort and bring you inhaler with you  How to Manage Your Diabetes Before and After Surgery  Why is it important to control my blood sugar before and after surgery? . Improving blood sugar levels before and after surgery helps healing and can limit problems. . A way of improving blood sugar control is eating a healthy diet by: o  Eating less sugar and carbohydrates o  Increasing activity/exercise o  Talking with your doctor about reaching your blood sugar goals . High blood sugars (greater than 180 mg/dL) can raise your risk of infections and slow your recovery, so you will need to focus on controlling your diabetes during the weeks before surgery. . Make sure that the doctor who takes care of your diabetes knows about your  planned surgery including the date and location.  How do I manage my blood sugar before surgery? . Check your blood sugar at least 4 times a day, starting 2 days before surgery, to make sure that the level is not too high or low. o Check your blood sugar the morning of your surgery when you wake up and every 2 hours until you get to the Short Stay unit. . If your blood sugar is less than 70 mg/dL, you will need to treat for low blood sugar: o Do not take insulin. o Treat a low blood sugar (less than 70 mg/dL) with  cup of clear juice (cranberry or apple), 4 glucose tablets, OR glucose gel. o Recheck blood sugar in 15 minutes after treatment (to make sure it is greater than 70 mg/dL). If your blood sugar is not greater than 70 mg/dL on recheck, call [phone] for further instructions. . Report your blood sugar to the short stay nurse when you get to Short Stay.  . If you are admitted to the hospital after surgery: o Your blood sugar will be checked by the staff and you will probably be given insulin after surgery (instead of oral diabetes medicines) to make sure you have good blood sugar levels. o The goal for blood sugar control after surgery is 80-180 mg/dL.   WHAT  DO I DO ABOUT MY DIABETES MEDICATION?  .   DO NOT TAKE ANY DIABETIC MEDICATIONS DAY OF YOUR SURGERY                               You may not have any metal on your body including hair pins and               piercings              Do not wear jewelry, make-up, lotions, powders or perfumes, deodorant             Do not wear nail polish on your fingernails.  Do not shave  48 hours prior to surgery.     Do not bring valuables to the hospital. Deanna Watson.  Contacts, dentures or bridgework may not be worn into surgery.       Special Instructions: N/A              Please read over the following fact sheets you were  given: _____________________________________________________________________             [facility] - Preparing for Surgery  Before surgery, you can play an important role.   Because skin is not sterile, your skin needs to be as free of germs as possible.   You can reduce the number of germs on your skin by washing with CHG (chlorahexidine gluconate) soap before surgery.   CHG is an antiseptic cleaner which kills germs and bonds with the skin to continue killing germs even after washing. Please DO NOT use if you have an allergy to CHG or antibacterial soaps.   If your skin becomes reddened/irritated stop using the CHG and inform your nurse when you arrive at Short Stay. Do not shave (including legs and underarms) for at least 48 hours prior to the first CHG shower.    Please follow these instructions carefully:  1.  Shower with CHG Soap the night before surgery and the  morning of Surgery.  2.  If you choose to wash your hair, wash your hair first as usual with your  normal  shampoo.  3.  After you shampoo, rinse your hair and body thoroughly to remove the  shampoo.                                        4.  Use CHG as you would any other liquid soap.  You can apply chg directly  to the skin and wash                       Gently with a scrungie or clean washcloth.  5.  Apply the CHG Soap to your body ONLY FROM THE NECK DOWN.   Do not use on face/ open                           Wound or open sores. Avoid contact with eyes, ears mouth and genitals (private parts).                       Wash face,  Genitals (private parts) with your normal soap.  6.  Wash thoroughly, paying special attention to the area where your surgery  will be performed.  7.  Thoroughly rinse your body with warm water from the neck down.  8.  DO NOT shower/wash with your normal soap after using and rinsing off  the CHG Soap.             9.  Pat yourself dry with a clean towel.            10.  Wear clean  pajamas.            11.  Place clean sheets on your bed the night of your first shower and do not  sleep with pets. Day of Surgery : Do not apply any lotions/deodorants the morning of surgery.  Please wear clean clothes to the hospital/surgery center.  FAILURE TO FOLLOW THESE INSTRUCTIONS MAY RESULT IN THE CANCELLATION OF YOUR SURGERY PATIENT SIGNATURE_________________________________  NURSE SIGNATURE__________________________________  ________________________________________________________________________   Adam Phenix  An incentive spirometer is a tool that can help keep your lungs clear and active. This tool measures how well you are filling your lungs with each breath. Taking long deep breaths may help reverse or decrease the chance of developing breathing (pulmonary) problems (especially infection) following:  A long period of time when you are unable to move or be active. BEFORE THE PROCEDURE   If the spirometer includes an indicator to show your best effort, your nurse or respiratory therapist will set it to a desired goal.  If possible, sit up straight or lean slightly forward. Try not to slouch.  Hold the incentive spirometer in an upright position. INSTRUCTIONS FOR USE  1. Sit on the edge of your bed if possible, or sit up as far as you can in bed or on a chair. 2. Hold the incentive spirometer in an upright position. 3. Breathe out normally. 4. Place the mouthpiece in your mouth and seal your lips tightly around it. 5. Breathe in slowly and as deeply as possible, raising the piston or the ball toward the top of the column. 6. Hold your breath for 3-5 seconds or for as long as possible. Allow the piston or ball to fall to the bottom of the column. 7. Remove the mouthpiece from your mouth and breathe out normally. 8. Rest for a few seconds and repeat Steps 1 through 7 at least 10 times every 1-2 hours when you are awake. Take your time and take a few normal breaths  between deep breaths. 9. The spirometer may include an indicator to show your best effort. Use the indicator as a goal to work toward during each repetition. 10. After each set of 10 deep breaths, practice coughing to be sure your lungs are clear. If you have an incision (the cut made at the time of surgery), support your incision when coughing by placing a pillow or rolled up towels firmly against it. Once you are able to get out of bed, walk around indoors and cough well. You may stop using the incentive spirometer when instructed by your caregiver.  RISKS AND COMPLICATIONS  Take your time so you do not get dizzy or light-headed.  If you are in pain, you may need to take or ask for pain medication before doing incentive spirometry. It is harder to take a deep breath if you are having pain. AFTER USE  Rest and breathe slowly and easily.  It can be helpful to keep track of a log of your progress. Your  caregiver can provide you with a simple table to help with this. If you are using the spirometer at home, follow these instructions: [place] IF:   You are having difficultly using the spirometer.  You have trouble using the spirometer as often as instructed.  Your pain medication is not giving enough relief while using the spirometer.  You develop fever of 100.5 F (38.1 C) or higher. SEEK IMMEDIATE MEDICAL CARE IF:   You cough up bloody sputum that had not been present before.  You develop fever of 102 F (38.9 C) or greater.  You develop worsening pain at or near the incision site. MAKE SURE YOU:   Understand these instructions.  Will watch your condition.  Will get help right away if you are not doing well or get worse. Document Released: 06/17/2006 Document Revised: 04/29/2011 Document Reviewed: 08/18/2006 [facility] Patient Information 2014 [place], [place].   ________________________________________________________________________

## 2018-11-23 ENCOUNTER — Encounter (HOSPITAL_COMMUNITY): Payer: Self-pay

## 2018-11-23 ENCOUNTER — Other Ambulatory Visit: Payer: Self-pay

## 2018-11-23 ENCOUNTER — Encounter (HOSPITAL_COMMUNITY)
Admission: RE | Admit: 2018-11-23 | Discharge: 2018-11-23 | Disposition: A | Discharge status: Home / Self Care | Payer: Medicare HMO | Source: Ambulatory Visit | Attending: Orthopedic Surgery | Admitting: Orthopedic Surgery

## 2018-11-23 ENCOUNTER — Ambulatory Visit (HOSPITAL_COMMUNITY)
Admission: RE | Admit: 2018-11-23 | Discharge: 2018-11-23 | Disposition: A | Discharge status: Home / Self Care | Payer: Medicare HMO | Source: Ambulatory Visit | Attending: Orthopedic Surgery | Admitting: Orthopedic Surgery

## 2018-11-23 DIAGNOSIS — Z01818 Encounter for other preprocedural examination: Secondary | ICD-10-CM | POA: Diagnosis not present

## 2018-11-23 DIAGNOSIS — I10 Essential (primary) hypertension: Secondary | ICD-10-CM | POA: Diagnosis not present

## 2018-11-23 DIAGNOSIS — R918 Other nonspecific abnormal finding of lung field: Secondary | ICD-10-CM | POA: Diagnosis not present

## 2018-11-23 DIAGNOSIS — E1141 Type 2 diabetes mellitus with diabetic mononeuropathy: Secondary | ICD-10-CM | POA: Insufficient documentation

## 2018-11-23 DIAGNOSIS — E782 Mixed hyperlipidemia: Secondary | ICD-10-CM | POA: Insufficient documentation

## 2018-11-23 DIAGNOSIS — I456 Pre-excitation syndrome: Secondary | ICD-10-CM | POA: Insufficient documentation

## 2018-11-23 DIAGNOSIS — M1711 Unilateral primary osteoarthritis, right knee: Secondary | ICD-10-CM | POA: Diagnosis not present

## 2018-11-23 DIAGNOSIS — G589 Mononeuropathy, unspecified: Secondary | ICD-10-CM | POA: Insufficient documentation

## 2018-11-23 DIAGNOSIS — Z79899 Other long term (current) drug therapy: Secondary | ICD-10-CM | POA: Insufficient documentation

## 2018-11-23 DIAGNOSIS — Z7984 Long term (current) use of oral hypoglycemic drugs: Secondary | ICD-10-CM | POA: Diagnosis not present

## 2018-11-23 HISTORY — DX: Cardiac arrhythmia, unspecified: I49.9

## 2018-11-23 LAB — URINALYSIS, ROUTINE W REFLEX MICROSCOPIC
Bacteria, UA: NONE SEEN
Bilirubin Urine: NEGATIVE
Glucose, UA: 50 mg/dL — AB
Hgb urine dipstick: NEGATIVE
Ketones, ur: NEGATIVE mg/dL
Leukocytes,Ua: NEGATIVE
Nitrite: NEGATIVE
Protein, ur: 100 mg/dL — AB
Specific Gravity, Urine: 1.02 (ref 1.005–1.030)
pH: 5 (ref 5.0–8.0)

## 2018-11-23 LAB — HEMOGLOBIN A1C
Hgb A1c MFr Bld: 6.8 % — ABNORMAL HIGH (ref 4.8–5.6)
Mean Plasma Glucose: 148.46 mg/dL

## 2018-11-23 LAB — CBC WITH DIFFERENTIAL/PLATELET
Abs Immature Granulocytes: 0.03 10*3/uL (ref 0.00–0.07)
Basophils Absolute: 0.1 10*3/uL (ref 0.0–0.1)
Basophils Relative: 1 %
Eosinophils Absolute: 0.2 10*3/uL (ref 0.0–0.5)
Eosinophils Relative: 3 %
HCT: 41.8 % (ref 36.0–46.0)
Hemoglobin: 13.3 g/dL (ref 12.0–15.0)
Immature Granulocytes: 1 %
Lymphocytes Relative: 29 %
Lymphs Abs: 1.7 10*3/uL (ref 0.7–4.0)
MCH: 29.3 pg (ref 26.0–34.0)
MCHC: 31.8 g/dL (ref 30.0–36.0)
MCV: 92.1 fL (ref 80.0–100.0)
Monocytes Absolute: 0.4 10*3/uL (ref 0.1–1.0)
Monocytes Relative: 7 %
Neutro Abs: 3.6 10*3/uL (ref 1.7–7.7)
Neutrophils Relative %: 59 %
Platelets: 216 10*3/uL (ref 150–400)
RBC: 4.54 MIL/uL (ref 3.87–5.11)
RDW: 11.9 % (ref 11.5–15.5)
WBC: 5.9 10*3/uL (ref 4.0–10.5)
nRBC: 0 % (ref 0.0–0.2)

## 2018-11-23 LAB — BASIC METABOLIC PANEL
Anion gap: 4 — ABNORMAL LOW (ref 5–15)
BUN: 26 mg/dL — ABNORMAL HIGH (ref 8–23)
CO2: 30 mmol/L (ref 22–32)
Calcium: 9.7 mg/dL (ref 8.9–10.3)
Chloride: 104 mmol/L (ref 98–111)
Creatinine, Ser: 0.71 mg/dL (ref 0.44–1.00)
GFR calc Af Amer: >60 mL/min (ref >60)
GFR calc non Af Amer: >60 mL/min (ref >60)
Glucose, Bld: 101 mg/dL — ABNORMAL HIGH (ref 70–99)
Potassium: 4.9 mmol/L (ref 3.5–5.1)
Sodium: 138 mmol/L (ref 135–145)

## 2018-11-23 LAB — GLUCOSE, CAPILLARY: Glucose-Capillary: 98 mg/dL (ref 70–99)

## 2018-11-23 LAB — PROTIME-INR
INR: 0.8 (ref 0.8–1.2)
Prothrombin Time: 11.4 seconds (ref 11.4–15.2)

## 2018-11-23 LAB — ABO/RH: ABO/RH(D): B POS

## 2018-11-23 LAB — APTT: aPTT: 27 seconds (ref 24–36)

## 2018-11-23 NOTE — Progress Notes (Addendum)
PCP - Dr. Scarlette Calico Cardiologist -Bridgette Christopher  Chest x-ray - 11/23/18 EKG - 04/09/18 Stress Test - no ECHO - no Cardiac Cath - no  Sleep Study - no CPAP -   Fasting Blood Sugar - 112-120 Checks Blood Sugar _QD____ times a day  Blood Thinner Instructions  :na Aspirin Instructions: Last Dose:  Anesthesia review:   Patient denies shortness of breath, fever, cough and chest pain at PAT appointment YES  Patient verbalized understanding of instructions that were given to them at the PAT appointment. Patient was also instructed that they will need to review over the PAT instructions again at home before surgery. YES Pt has wheezing if she doesn't use her inhaler. She has a dog bite on her 2nd toe of her LT foot. Dr. Mayer Camel is aware.

## 2018-11-25 NOTE — Anesthesia Preprocedure Evaluation (Addendum)
Anesthesia Evaluation  Patient identified by MRN, date of birth, ID band  Reviewed: Allergy & Precautions, NPO status , Patient's Chart, lab work & pertinent test results  History of Anesthesia Complications (+) PONV  Airway Mallampati: II       Dental no notable dental hx. (+) Teeth Intact   Pulmonary    Pulmonary exam normal breath sounds clear to auscultation       Cardiovascular hypertension, Pt. on medications Normal cardiovascular exam Rhythm:Regular Rate:Normal     Neuro/Psych PSYCHIATRIC DISORDERS Depression  Neuromuscular disease    GI/Hepatic   Endo/Other  diabetes, Well Controlled, Type 2, Oral Hypoglycemic Agents  Renal/GU      Musculoskeletal  (+) Arthritis , Osteoarthritis,    Abdominal Normal abdominal exam  (+)   Peds  Hematology   Anesthesia Other Findings   Reproductive/Obstetrics                           Anesthesia Physical Anesthesia Plan  ASA: III  Anesthesia Plan: Spinal   Post-op Pain Management:  [facility] for Post-op pain   Induction:   PONV Risk Score and Plan: Propofol infusion, Ondansetron, Midazolam, Scopolamine patch - Pre-op and Treatment may vary due to age or medical condition  Airway Management Planned: Natural Airway, Simple Face Mask and Nasal Cannula  Additional Equipment: None  Intra-op Plan:   Post-operative Plan:   Informed Consent: I have reviewed the patients History and Physical, chart, labs and discussed the procedure including the risks, benefits and alternatives for the proposed anesthesia with the patient or authorized representative who has indicated his/her understanding and acceptance.       Plan Discussed with:   Anesthesia Plan Comments: (See PAT note 11/23/2018, Konrad Felix, PA-C)      Anesthesia Quick Evaluation

## 2018-11-25 NOTE — Progress Notes (Signed)
Anesthesia Chart Review   Case: [id number] Date/Time: 11/30/18 0700   Procedure: RIGHT TOTAL KNEE ARTHROPLASTY (Right Knee)   Anesthesia type: Spinal   Pre-op diagnosis: RIGHT KNEE OSTEOARTHRITIS   Location: [place] 06 / [facility]   Surgeon: Frederik Pear, MD      DISCUSSION:68 y.o. never smoker with h/o PONV, DM II, HTN, COPD, HLD, GERD, asthma, WPW a/p ablation 2009, right knee OA scheduled for above procedure 11/30/2018 with Dr. Frederik Pear.   Pt cleared by cardiology 10/19/2018.  Per Lyda Jester, PA-C, "Lakeysa Fitting was last seen in Feb 2020 by Dr. Percival Spanish for preop clearance for the procedure listed below and was cleared to undergo knee surgery w/o need for further cardiac testing but surgery canceled due to COVID 19 pandemic. Surgery has now been rescheduled  Since that day, Elaine Ratterree has done well w/o any anginal symptomatology. No new cardiac symptoms since last visit. Therefore, based on ACC/AHA guidelines, the patient would be at acceptable risk for the planned procedure without further cardiovascular testing."  Anticipate pt can proceed with planned procedure barring acute status change.   VS: BP 136/61   Pulse 64   Temp 36.8 C (Oral)   Resp 16   Ht 5' (1.524 m)   Wt 74.4 kg   SpO2 98%   BMI 32.03 kg/m   PROVIDERS: Janith Lima, MD is PCP   Buford Dresser, MD is Cardiologist  LABS: Labs reviewed: Acceptable for surgery. (all labs ordered are listed, but only abnormal results are displayed)  Labs Reviewed  BASIC METABOLIC PANEL - Abnormal; Notable for the following components:      Result Value   Glucose, Bld 101 (*)    BUN 26 (*)    Anion gap 4 (*)    All other components within normal limits  URINALYSIS, ROUTINE W REFLEX MICROSCOPIC - Abnormal; Notable for the following components:   Color, Urine AMBER (*)    APPearance CLOUDY (*)    Glucose, UA 50 (*)    Protein, ur 100 (*)    All other components within normal limits  HEMOGLOBIN A1C -  Abnormal; Notable for the following components:   Hgb A1c MFr Bld 6.8 (*)    All other components within normal limits  GLUCOSE, CAPILLARY  APTT  CBC WITH DIFFERENTIAL/PLATELET  PROTIME-INR  TYPE AND SCREEN  ABO/RH     IMAGES: Chest Xray 11/23/2018 IMPRESSION: Ill-defined left upper lobe opacities may represent sequela of infection or ground-glass nodules. Consider chest CT for further assessment. No signs of dense consolidation. No pleural effusion.  Mild cardiomegaly.  EKG: 04/09/2018 Rate 67 bpm Normal sinus rhythm   CV:  Past Medical History:  Diagnosis Date  . Allergy   . Anemia    history of  . Arthritis   . Asthma    well controlled  . Bell's palsy    lasted only about 2 days  . Chicken pox   . Chronic bronchitis ([place])   . COPD (chronic obstructive pulmonary disease) ([place])   . Depression   . Diabetes mellitus without complication ([place])   . Dysrhythmia    hx of WPW now resolved but has palpatations  . Fatty liver    mild  . Fibromyalgia   . GERD (gastroesophageal reflux disease)   . History of blood transfusion   . Hyperlipidemia   . Hypertension   . Mild cardiomegaly   . Peripheral neuropathy   . PONV (postoperative nausea and vomiting)   . Sciatica   .  WPW (Wolff-Parkinson-White syndrome)     Past Surgical History:  Procedure Laterality Date  . CARDIAC ELECTROPHYSIOLOGY [place] AND ABLATION  2009  . CERVICAL [place] BIOPSY    . CHOLECYSTECTOMY    . COLONOSCOPY    . JOINT REPLACEMENT    . LAPAROSCOPIC CHOLECYSTECTOMY SINGLE SITE WITH INTRAOPERATIVE CHOLANGIOGRAM N/A 01/23/2017   Procedure: LAPAROSCOPIC CHOLECYSTECTOMY SINGLE SITE WITH INTRAOPERATIVE CHOLANGIOGRAM ERAS PATHWAY;  Surgeon: Michael Boston, MD;  Location: [facility];  Service: General;  Laterality: N/A;  . LIVER BIOPSY N/A 01/23/2017   Procedure: BIOPSY OF LIVER;  Surgeon: Michael Boston, MD;  Location: [facility];  Service: General;  Laterality: N/A;  . REPLACEMENT TOTAL KNEE Left 2005  .  UPPER GI ENDOSCOPY      MEDICATIONS: . acetaminophen (TYLENOL) 500 MG tablet  . albuterol (PROVENTIL) (2.5 MG/3ML) 0.083% nebulizer solution  . B Complex-C (SUPER B COMPLEX PO)  . budesonide-formoterol (SYMBICORT) 160-4.5 MCG/ACT inhaler  . calcium carbonate (TUMS - DOSED IN MG ELEMENTAL CALCIUM) 500 MG chewable tablet  . Camphor-Menthol-Methyl Sal (TIGER BALM MUSCLE RUB EX)  . celecoxib (CELEBREX) 200 MG capsule  . escitalopram (LEXAPRO) 20 MG tablet  . gabapentin (NEURONTIN) 300 MG capsule  . glucose blood test strip  . hydroxypropyl methylcellulose / hypromellose (ISOPTO TEARS / GONIOVISC) 2.5 % ophthalmic solution  . JARDIANCE 25 MG TABS tablet  . lisinopril-hydrochlorothiazide (ZESTORETIC) 20-12.5 MG tablet  . lovastatin (MEVACOR) 10 MG tablet  . Menthol, Topical Analgesic, (BIOFREEZE EX)  . metFORMIN (GLUCOPHAGE-XR) 500 MG 24 hr tablet  . omeprazole (PRILOSEC) 20 MG capsule   . 0.9 %  sodium chloride infusion    Maia Plan [facility] Pre-Surgical Testing [phone] 11/25/18  11:41 AM

## 2018-11-26 ENCOUNTER — Other Ambulatory Visit (HOSPITAL_COMMUNITY)
Admission: RE | Admit: 2018-11-26 | Discharge: 2018-11-26 | Disposition: A | Discharge status: Home / Self Care | Payer: Medicare HMO | Source: Ambulatory Visit | Attending: Orthopedic Surgery | Admitting: Orthopedic Surgery

## 2018-11-26 ENCOUNTER — Other Ambulatory Visit: Payer: Self-pay | Admitting: Orthopedic Surgery

## 2018-11-26 DIAGNOSIS — M1711 Unilateral primary osteoarthritis, right knee: Secondary | ICD-10-CM | POA: Diagnosis present

## 2018-11-26 DIAGNOSIS — Z01812 Encounter for preprocedural laboratory examination: Secondary | ICD-10-CM | POA: Insufficient documentation

## 2018-11-26 DIAGNOSIS — Z20828 Contact with and (suspected) exposure to other viral communicable diseases: Secondary | ICD-10-CM | POA: Insufficient documentation

## 2018-11-26 DIAGNOSIS — R911 Solitary pulmonary nodule: Secondary | ICD-10-CM

## 2018-11-26 NOTE — H&P (Signed)
TOTAL KNEE ADMISSION H&P  Patient is being admitted for right total knee arthroplasty.  Subjective:  Chief Complaint:right knee pain.  HPI: Deanna Watson, 68 y.o. female, has a history of pain and functional disability in the right knee due to arthritis and has failed non-surgical conservative treatments for greater than 12 weeks to includeNSAID's and/or analgesics, corticosteriod injections, use of assistive devices, weight reduction as appropriate and activity modification.  Onset of symptoms was gradual, starting several years ago with gradually worsening course since that time. The patient noted no past surgery on the right knee(s).  Patient currently rates pain in the right knee(s) at 10 out of 10 with activity. Patient has night pain, worsening of pain with activity and weight bearing, pain that interferes with activities of daily living, pain with passive range of motion and crepitus.  Patient has evidence of joint space narrowing by imaging studies.   There is no active infection.  Patient Active Problem List   Diagnosis Date Noted  . Pre-op evaluation 11/13/2018  . WPW (Wolff-Parkinson-White syndrome) 04/09/2018  . PVD (peripheral vascular disease) ([place]) 04/09/2018  . Diabetic mononeuropathy associated with type 2 diabetes mellitus ([place]) 03/04/2018  . OA (osteoarthritis) of knee 11/02/2017  . 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[place]) 11/02/2017  . Diabetic retinopathy ([place]) 06/12/2017  . Chronic cholecystitis with calculus 01/23/2017  . Peripheral neuropathy 12/11/2016  . Tubular adenoma of colon 10/15/2016  . Medicare annual wellness visit, subsequent 04/04/2016  . Routine adult health maintenance 04/04/2016  . Type 2 diabetes mellitus without complication, without long-term current use of insulin ([place]) 03/01/2016  . Essential hypertension 03/01/2016  . Mild episode of recurrent major depressive disorder ([place]) 03/01/2016  . Mixed hyperlipidemia 03/01/2016   Past Medical History:   Diagnosis Date  . Allergy   . Anemia    history of  . Arthritis   . Asthma    well controlled  . Bell's palsy    lasted only about 2 days  . Chicken pox   . Chronic bronchitis ([place])   . COPD (chronic obstructive pulmonary disease) ([place])   . Depression   . Diabetes mellitus without complication ([place])   . Dysrhythmia    hx of WPW now resolved but has palpatations  . Fatty liver    mild  . Fibromyalgia   . GERD (gastroesophageal reflux disease)   . History of blood transfusion   . Hyperlipidemia   . Hypertension   . Mild cardiomegaly   . Peripheral neuropathy   . PONV (postoperative nausea and vomiting)   . Sciatica   . WPW (Wolff-Parkinson-White syndrome)     Past Surgical History:  Procedure Laterality Date  . CARDIAC ELECTROPHYSIOLOGY [place] AND ABLATION  2009  . CERVICAL [place] BIOPSY    . CHOLECYSTECTOMY    . COLONOSCOPY    . JOINT REPLACEMENT    . LAPAROSCOPIC CHOLECYSTECTOMY SINGLE SITE WITH INTRAOPERATIVE CHOLANGIOGRAM N/A 01/23/2017   Procedure: LAPAROSCOPIC CHOLECYSTECTOMY SINGLE SITE WITH INTRAOPERATIVE CHOLANGIOGRAM ERAS PATHWAY;  Surgeon: Michael Boston, MD;  Location: [facility];  Service: General;  Laterality: N/A;  . LIVER BIOPSY N/A 01/23/2017   Procedure: BIOPSY OF LIVER;  Surgeon: Michael Boston, MD;  Location: [facility];  Service: General;  Laterality: N/A;  . REPLACEMENT TOTAL KNEE Left 2005  . UPPER GI ENDOSCOPY      Current Facility-Administered Medications  Medication Dose Route Frequency Provider Last Rate Last Dose  . 0.9 %  sodium chloride infusion  500 mL Intravenous Continuous Armbruster, Carlota Raspberry, MD  Current Outpatient Medications  Medication Sig Dispense Refill Last Dose  . acetaminophen (TYLENOL) 500 MG tablet Take 1,000 mg by mouth every 6 (six) hours as needed for moderate pain or headache.      . B Complex-C (SUPER B COMPLEX PO) Take 1 tablet by mouth daily.     . budesonide-formoterol (SYMBICORT) 160-4.5 MCG/ACT inhaler Inhale 1 puff  into the lungs 2 (two) times daily.     . calcium carbonate (TUMS - DOSED IN MG ELEMENTAL CALCIUM) 500 MG chewable tablet Chew 1-2 tablets by mouth daily as needed for indigestion or heartburn.     . Camphor-Menthol-Methyl Sal (TIGER BALM MUSCLE RUB EX) Apply 1 application topically daily as needed (pain).     . celecoxib (CELEBREX) 200 MG capsule Take 1 capsule (200 mg total) by mouth daily. 90 capsule 1   . escitalopram (LEXAPRO) 20 MG tablet Take 1 tablet (20 mg total) by mouth daily. 10 tablet 0   . gabapentin (NEURONTIN) 300 MG capsule TAKE 1 CAPSULE THREE TIMES DAILY (Patient taking differently: Take 300 mg by mouth 3 (three) times daily. ) 270 capsule 1   . hydroxypropyl methylcellulose / hypromellose (ISOPTO TEARS / GONIOVISC) 2.5 % ophthalmic solution Place 1 drop into both eyes 3 (three) times daily as needed for dry eyes.     Marland Kitchen JARDIANCE 25 MG TABS tablet Take 1 tablet by mouth once daily (Patient taking differently: Take 25 mg by mouth daily. ) 90 tablet 1   . lisinopril-hydrochlorothiazide (ZESTORETIC) 20-12.5 MG tablet Take 1 tablet by mouth daily. 90 tablet 1   . lovastatin (MEVACOR) 10 MG tablet Take 1 tablet (10 mg total) by mouth at bedtime. 90 tablet 3   . Menthol, Topical Analgesic, (BIOFREEZE EX) Apply 1 application topically 2 (two) times daily as needed (pain).     . metFORMIN (GLUCOPHAGE-XR) 500 MG 24 hr tablet Take 2 tablets (1,000 mg total) by mouth 2 (two) times daily. (Patient taking differently: Take 500 mg by mouth 2 (two) times daily. ) 180 tablet 1   . albuterol (PROVENTIL) (2.5 MG/3ML) 0.083% nebulizer solution Take 3 mLs (2.5 mg total) by nebulization every 6 (six) hours as needed for wheezing or shortness of breath. (Patient not taking: Reported on 11/19/2018) 150 mL 1 Not Taking at Unknown time  . glucose blood test strip Use to check blood sugars 2 times daily. Dx Code- E11.41 100 each 12   . omeprazole (PRILOSEC) 20 MG capsule Take 2 capsules (40 mg total) by mouth  daily. (Patient not taking: Reported on 11/19/2018) 30 capsule 0 Not Taking at Unknown time   No Known Allergies  Social History   Tobacco Use  . Smoking status: Never Smoker  . Smokeless tobacco: Never Used  Substance Use Topics  . Alcohol use: No    Family History  Problem Relation Age of Onset  . Arthritis Mother   . Heart disease Mother        CHF age 35  . Stroke Mother   . Hypertension Mother   . Kidney disease Mother   . Diabetes Mother   . Alcohol abuse Father   . Epilepsy Father   . Heart disease Brother        Died in his sleep age 44.    Marland Kitchen Ovarian cancer Paternal Aunt   . Stomach cancer Paternal Uncle   . Breast cancer Maternal Aunt   . Stomach cancer Maternal Uncle   . Colon cancer Neg Hx  Review of Systems  Constitutional: Positive for malaise/fatigue.  HENT: Negative.   Eyes: Positive for blurred vision.  Respiratory: Positive for shortness of breath.   Cardiovascular: Positive for chest pain, palpitations and leg swelling.       Irregular heart beat  Gastrointestinal: Negative.   Genitourinary: Negative.   Musculoskeletal: Positive for joint pain and myalgias.  Skin: Negative.   Neurological: Positive for dizziness.  Endo/Heme/Allergies: Bruises/bleeds easily.  Psychiatric/Behavioral: Positive for memory loss. The patient is nervous/anxious.     Objective:  Physical Exam  Constitutional: She appears well-developed and well-nourished.  HENT:  Head: Normocephalic and atraumatic.  Eyes: Pupils are equal, round, and reactive to light.  Neck: Normal range of motion. Neck supple.  Cardiovascular: Intact distal pulses.  Respiratory: Effort normal.  Musculoskeletal:        General: Tenderness and deformity present.     Comments: The right knee has obvious varus deformity crepitus as you take her through range of motion no palpable effusion.    Neurological: She is alert.  Skin: Skin is warm and dry.  Psychiatric: She has a normal mood and  affect. Her behavior is normal. Judgment and thought content normal.    Vital signs in last 24 hours:    Labs:   Estimated body mass index is 32.03 kg/m as calculated from the following:   Height as of 11/23/18: 5' (1.524 m).   Weight as of 11/23/18: 74.4 kg.   Imaging Review Plain radiographs demonstrate bone-on-bone arthritis lateral facet of the patella to the lateral femoral condyle and moderate arthritis with subchondral cystic changes to the distal femur right greater than left knee.   Assessment/Plan:  End stage arthritis, right knee   The patient history, physical examination, clinical judgment of the provider and imaging studies are consistent with end stage degenerative joint disease of the right knee(s) and total knee arthroplasty is deemed medically necessary. The treatment options including medical management, injection therapy arthroscopy and arthroplasty were discussed at length. The risks and benefits of total knee arthroplasty were presented and reviewed. The risks due to aseptic loosening, infection, stiffness, patella tracking problems, thromboembolic complications and other imponderables were discussed. The patient acknowledged the explanation, agreed to proceed with the plan and consent was signed. Patient is being admitted for inpatient treatment for surgery, pain control, PT, OT, prophylactic antibiotics, VTE prophylaxis, progressive ambulation and ADL's and discharge planning. The patient is planning to be discharged home with home health services     Patient's anticipated LOS is less than 2 midnights, meeting these requirements: - Younger than 70 - Lives within 1 hour of care - Has a competent adult at home to recover with post-op recover - NO history of  - Chronic pain requiring opiods  - Diabetes  - Coronary Artery Disease  - Heart failure  - Heart attack  - Stroke  - DVT/VTE  - Cardiac arrhythmia  - Respiratory Failure/COPD  - Renal failure  -  Anemia  - Advanced Liver disease

## 2018-11-27 ENCOUNTER — Other Ambulatory Visit: Payer: Self-pay

## 2018-11-27 ENCOUNTER — Ambulatory Visit
Admission: RE | Admit: 2018-11-27 | Discharge: 2018-11-27 | Disposition: A | Discharge status: Home / Self Care | Payer: Medicare HMO | Source: Ambulatory Visit | Attending: Orthopedic Surgery | Admitting: Orthopedic Surgery

## 2018-11-27 DIAGNOSIS — J984 Other disorders of lung: Secondary | ICD-10-CM | POA: Diagnosis not present

## 2018-11-27 DIAGNOSIS — R911 Solitary pulmonary nodule: Secondary | ICD-10-CM

## 2018-11-27 LAB — NOVEL CORONAVIRUS, NAA (HOSP ORDER, SEND-OUT TO REF LAB; TAT 18-24 HRS): SARS-CoV-2, NAA: NOT DETECTED

## 2018-11-27 MED ORDER — IOPAMIDOL (ISOVUE-300) INJECTION 61%
75.0000 mL | Freq: Once | INTRAVENOUS | Status: AC | PRN
  Administered 2018-11-27: 75 mL via INTRAVENOUS

## 2018-11-29 MED ORDER — TRANEXAMIC ACID 1000 MG/10ML IV SOLN
2000.0000 mg | INTRAVENOUS | Status: DC
  Filled 2018-11-29: qty 20

## 2018-11-29 MED ORDER — BUPIVACAINE LIPOSOME 1.3 % IJ SUSP
20.0000 mL | Freq: Once | INTRAMUSCULAR | Status: DC
  Filled 2018-11-29: qty 20

## 2018-11-30 ENCOUNTER — Ambulatory Visit (HOSPITAL_COMMUNITY): Payer: Medicare HMO | Admitting: Physician Assistant

## 2018-11-30 ENCOUNTER — Encounter (HOSPITAL_COMMUNITY): Payer: Self-pay | Admitting: Emergency Medicine

## 2018-11-30 ENCOUNTER — Observation Stay (HOSPITAL_COMMUNITY)
Admission: AD | Admit: 2018-11-30 | Discharge: 2018-12-01 | Disposition: A | Discharge status: Home / Self Care | Payer: Medicare HMO | Attending: Orthopedic Surgery | Admitting: Orthopedic Surgery

## 2018-11-30 ENCOUNTER — Encounter (HOSPITAL_COMMUNITY)
Admission: AD | Disposition: A | Discharge status: Home / Self Care | Payer: Self-pay | Source: Home / Self Care | Attending: Orthopedic Surgery

## 2018-11-30 ENCOUNTER — Other Ambulatory Visit: Payer: Self-pay

## 2018-11-30 ENCOUNTER — Ambulatory Visit (HOSPITAL_COMMUNITY): Payer: Medicare HMO | Admitting: Anesthesiology

## 2018-11-30 DIAGNOSIS — E782 Mixed hyperlipidemia: Secondary | ICD-10-CM | POA: Diagnosis not present

## 2018-11-30 DIAGNOSIS — Z7984 Long term (current) use of oral hypoglycemic drugs: Secondary | ICD-10-CM | POA: Diagnosis not present

## 2018-11-30 DIAGNOSIS — Z96651 Presence of right artificial knee joint: Secondary | ICD-10-CM

## 2018-11-30 DIAGNOSIS — F330 Major depressive disorder, recurrent, mild: Secondary | ICD-10-CM | POA: Diagnosis not present

## 2018-11-30 DIAGNOSIS — M797 Fibromyalgia: Secondary | ICD-10-CM | POA: Diagnosis not present

## 2018-11-30 DIAGNOSIS — Z791 Long term (current) use of non-steroidal anti-inflammatories (NSAID): Secondary | ICD-10-CM | POA: Diagnosis not present

## 2018-11-30 DIAGNOSIS — I10 Essential (primary) hypertension: Secondary | ICD-10-CM | POA: Insufficient documentation

## 2018-11-30 DIAGNOSIS — Z96652 Presence of left artificial knee joint: Secondary | ICD-10-CM | POA: Diagnosis not present

## 2018-11-30 DIAGNOSIS — D649 Anemia, unspecified: Secondary | ICD-10-CM | POA: Insufficient documentation

## 2018-11-30 DIAGNOSIS — G8918 Other acute postprocedural pain: Secondary | ICD-10-CM | POA: Diagnosis not present

## 2018-11-30 DIAGNOSIS — E1141 Type 2 diabetes mellitus with diabetic mononeuropathy: Secondary | ICD-10-CM | POA: Diagnosis not present

## 2018-11-30 DIAGNOSIS — E1151 Type 2 diabetes mellitus with diabetic peripheral angiopathy without gangrene: Secondary | ICD-10-CM | POA: Insufficient documentation

## 2018-11-30 DIAGNOSIS — E11319 Type 2 diabetes mellitus with unspecified diabetic retinopathy without macular edema: Secondary | ICD-10-CM | POA: Diagnosis not present

## 2018-11-30 DIAGNOSIS — M1711 Unilateral primary osteoarthritis, right knee: Secondary | ICD-10-CM | POA: Diagnosis not present

## 2018-11-30 DIAGNOSIS — Z79899 Other long term (current) drug therapy: Secondary | ICD-10-CM | POA: Insufficient documentation

## 2018-11-30 DIAGNOSIS — J449 Chronic obstructive pulmonary disease, unspecified: Secondary | ICD-10-CM | POA: Insufficient documentation

## 2018-11-30 HISTORY — PX: TOTAL KNEE ARTHROPLASTY: SHX125

## 2018-11-30 LAB — TYPE AND SCREEN
ABO/RH(D): B POS
Antibody Screen: NEGATIVE

## 2018-11-30 LAB — GLUCOSE, CAPILLARY
Glucose-Capillary: 108 mg/dL — ABNORMAL HIGH (ref 70–99)
Glucose-Capillary: 168 mg/dL — ABNORMAL HIGH (ref 70–99)
Glucose-Capillary: 272 mg/dL — ABNORMAL HIGH (ref 70–99)
Glucose-Capillary: 404 mg/dL — ABNORMAL HIGH (ref 70–99)
Glucose-Capillary: 159 mg/dL — ABNORMAL HIGH (ref 70–99)

## 2018-11-30 LAB — HEMOGLOBIN A1C
Hgb A1c MFr Bld: 6.8 % — ABNORMAL HIGH (ref 4.8–5.6)
Mean Plasma Glucose: 148.46 mg/dL

## 2018-11-30 LAB — GLUCOSE, RANDOM: Glucose, Bld: 413 mg/dL — ABNORMAL HIGH (ref 70–99)

## 2018-11-30 SURGERY — ARTHROPLASTY, KNEE, TOTAL
Anesthesia: Spinal | Site: Knee | Laterality: Right

## 2018-11-30 MED ORDER — MEPERIDINE HCL 50 MG/ML IJ SOLN: 6.2500 mg | INTRAMUSCULAR | Status: DC | PRN

## 2018-11-30 MED ORDER — ZOLPIDEM TARTRATE 5 MG PO TABS: 5.0000 mg | ORAL_TABLET | Freq: Every evening | ORAL | Status: DC | PRN

## 2018-11-30 MED ORDER — POLYETHYLENE GLYCOL 3350 17 G PO PACK: 17.0000 g | PACK | Freq: Every day | ORAL | Status: DC | PRN

## 2018-11-30 MED ORDER — TIZANIDINE HCL 2 MG PO TABS: 2.0000 mg | ORAL_TABLET | Freq: Four times a day (QID) | ORAL | 0 refills | Status: DC | PRN

## 2018-11-30 MED ORDER — SODIUM CHLORIDE 0.9 % IR SOLN
Status: DC | PRN
  Administered 2018-11-30: 1000 mL

## 2018-11-30 MED ORDER — POLYVINYL ALCOHOL 1.4 % OP SOLN
1.0000 [drp] | Freq: Three times a day (TID) | OPHTHALMIC | Status: DC | PRN
  Filled 2018-11-30: qty 15

## 2018-11-30 MED ORDER — KCL IN DEXTROSE-NACL 20-5-0.45 MEQ/L-%-% IV SOLN
INTRAVENOUS | Status: DC
  Administered 2018-11-30: 11:00:00 via INTRAVENOUS
  Filled 2018-11-30: qty 1000

## 2018-11-30 MED ORDER — CELECOXIB 200 MG PO CAPS
200.0000 mg | ORAL_CAPSULE | Freq: Two times a day (BID) | ORAL | Status: DC
  Administered 2018-11-30 – 2018-12-01 (×3): 200 mg via ORAL
  Filled 2018-11-30 (×3): qty 1

## 2018-11-30 MED ORDER — DEXAMETHASONE SODIUM PHOSPHATE 10 MG/ML IJ SOLN
INTRAMUSCULAR | Status: AC
  Filled 2018-11-30: qty 1

## 2018-11-30 MED ORDER — GABAPENTIN 300 MG PO CAPS
300.0000 mg | ORAL_CAPSULE | Freq: Three times a day (TID) | ORAL | Status: DC
  Administered 2018-11-30 – 2018-12-01 (×4): 300 mg via ORAL
  Filled 2018-11-30 (×4): qty 1

## 2018-11-30 MED ORDER — ASPIRIN 81 MG PO CHEW
81.0000 mg | CHEWABLE_TABLET | Freq: Two times a day (BID) | ORAL | Status: DC
  Administered 2018-11-30 – 2018-12-01 (×2): 81 mg via ORAL
  Filled 2018-11-30 (×2): qty 1

## 2018-11-30 MED ORDER — DEXAMETHASONE SODIUM PHOSPHATE 4 MG/ML IJ SOLN
INTRAMUSCULAR | Status: DC | PRN
  Administered 2018-11-30: 4 mg via INTRAVENOUS

## 2018-11-30 MED ORDER — SODIUM CHLORIDE (PF) 0.9 % IJ SOLN
INTRAMUSCULAR | Status: AC
  Filled 2018-11-30: qty 20

## 2018-11-30 MED ORDER — MIDAZOLAM HCL 2 MG/2ML IJ SOLN
INTRAMUSCULAR | Status: AC
  Filled 2018-11-30: qty 2

## 2018-11-30 MED ORDER — ONDANSETRON HCL 4 MG PO TABS: 4.0000 mg | ORAL_TABLET | Freq: Four times a day (QID) | ORAL | Status: DC | PRN

## 2018-11-30 MED ORDER — DOCUSATE SODIUM 100 MG PO CAPS
100.0000 mg | ORAL_CAPSULE | Freq: Two times a day (BID) | ORAL | Status: DC
  Administered 2018-11-30 – 2018-12-01 (×3): 100 mg via ORAL
  Filled 2018-11-30 (×3): qty 1

## 2018-11-30 MED ORDER — CHLORHEXIDINE GLUCONATE 4 % EX LIQD: 60.0000 mL | Freq: Once | CUTANEOUS | Status: DC

## 2018-11-30 MED ORDER — BUPIVACAINE HCL (PF) 0.75 % IJ SOLN
INTRAMUSCULAR | Status: DC | PRN
  Administered 2018-11-30: 14 mg

## 2018-11-30 MED ORDER — BISACODYL 5 MG PO TBEC: 5.0000 mg | DELAYED_RELEASE_TABLET | Freq: Every day | ORAL | Status: DC | PRN

## 2018-11-30 MED ORDER — SODIUM CHLORIDE (PF) 0.9 % IJ SOLN
INTRAMUSCULAR | Status: DC | PRN
  Administered 2018-11-30: 70 mL

## 2018-11-30 MED ORDER — POTASSIUM CHLORIDE IN NACL 20-0.45 MEQ/L-% IV SOLN
INTRAVENOUS | Status: DC
  Administered 2018-11-30: 21:00:00 via INTRAVENOUS
  Filled 2018-11-30 (×2): qty 1000

## 2018-11-30 MED ORDER — WATER FOR IRRIGATION, STERILE IR SOLN
Status: DC | PRN
  Administered 2018-11-30 (×2): 1000 mL

## 2018-11-30 MED ORDER — ONDANSETRON HCL 4 MG/2ML IJ SOLN
4.0000 mg | Freq: Four times a day (QID) | INTRAMUSCULAR | Status: DC | PRN
  Administered 2018-12-01: 4 mg via INTRAVENOUS
  Filled 2018-11-30: qty 2

## 2018-11-30 MED ORDER — MIDAZOLAM HCL 5 MG/5ML IJ SOLN
INTRAMUSCULAR | Status: DC | PRN
  Administered 2018-11-30: 1 mg via INTRAVENOUS
  Administered 2018-11-30: 0.5 mg via INTRAVENOUS

## 2018-11-30 MED ORDER — CALCIUM CARBONATE ANTACID 500 MG PO CHEW: 1.0000 | CHEWABLE_TABLET | Freq: Every day | ORAL | Status: DC | PRN

## 2018-11-30 MED ORDER — ESCITALOPRAM OXALATE 20 MG PO TABS
20.0000 mg | ORAL_TABLET | Freq: Every day | ORAL | Status: DC
  Administered 2018-11-30 – 2018-12-01 (×2): 20 mg via ORAL
  Filled 2018-11-30 (×2): qty 1

## 2018-11-30 MED ORDER — METHOCARBAMOL 500 MG PO TABS
500.0000 mg | ORAL_TABLET | Freq: Four times a day (QID) | ORAL | Status: DC | PRN
  Administered 2018-11-30: 500 mg via ORAL
  Filled 2018-11-30: qty 1

## 2018-11-30 MED ORDER — MOMETASONE FURO-FORMOTEROL FUM 200-5 MCG/ACT IN AERO
2.0000 | INHALATION_SPRAY | Freq: Two times a day (BID) | RESPIRATORY_TRACT | Status: DC
  Administered 2018-11-30 – 2018-12-01 (×2): 2 via RESPIRATORY_TRACT
  Filled 2018-11-30: qty 8.8

## 2018-11-30 MED ORDER — OXYCODONE HCL 5 MG PO TABS
5.0000 mg | ORAL_TABLET | ORAL | Status: DC | PRN
  Administered 2018-11-30 – 2018-12-01 (×4): 5 mg via ORAL
  Filled 2018-11-30: qty 2
  Filled 2018-11-30 (×4): qty 1

## 2018-11-30 MED ORDER — CLONIDINE HCL (ANALGESIA) 100 MCG/ML EP SOLN
EPIDURAL | Status: DC | PRN
  Administered 2018-11-30: 100 ug

## 2018-11-30 MED ORDER — ONDANSETRON HCL 4 MG/2ML IJ SOLN
INTRAMUSCULAR | Status: AC
  Filled 2018-11-30: qty 2

## 2018-11-30 MED ORDER — LIDOCAINE HCL (CARDIAC) PF 100 MG/5ML IV SOSY
PREFILLED_SYRINGE | INTRAVENOUS | Status: DC | PRN
  Administered 2018-11-30: 30 mg via INTRAVENOUS

## 2018-11-30 MED ORDER — ACETAMINOPHEN 10 MG/ML IV SOLN: 1000.0000 mg | Freq: Once | INTRAVENOUS | Status: DC | PRN

## 2018-11-30 MED ORDER — INSULIN ASPART 100 UNIT/ML ~~LOC~~ SOLN
0.0000 [IU] | Freq: Three times a day (TID) | SUBCUTANEOUS | Status: DC
  Administered 2018-11-30: 13:00:00 8 [IU] via SUBCUTANEOUS
  Administered 2018-11-30: 15 [IU] via SUBCUTANEOUS

## 2018-11-30 MED ORDER — BUPIVACAINE LIPOSOME 1.3 % IJ SUSP
INTRAMUSCULAR | Status: DC | PRN
  Administered 2018-11-30: 20 mL

## 2018-11-30 MED ORDER — HYDROCHLOROTHIAZIDE 12.5 MG PO CAPS
12.5000 mg | ORAL_CAPSULE | Freq: Every day | ORAL | Status: DC
  Administered 2018-11-30: 15:00:00 12.5 mg via ORAL
  Filled 2018-11-30 (×2): qty 1

## 2018-11-30 MED ORDER — FENTANYL CITRATE (PF) 100 MCG/2ML IJ SOLN
INTRAMUSCULAR | Status: AC
  Filled 2018-11-30: qty 2

## 2018-11-30 MED ORDER — LISINOPRIL-HYDROCHLOROTHIAZIDE 20-12.5 MG PO TABS: 1.0000 | ORAL_TABLET | Freq: Every day | ORAL | Status: DC

## 2018-11-30 MED ORDER — TRANEXAMIC ACID-NACL 1000-0.7 MG/100ML-% IV SOLN
1000.0000 mg | INTRAVENOUS | Status: AC
  Administered 2018-11-30: 1000 mg via INTRAVENOUS
  Filled 2018-11-30: qty 100

## 2018-11-30 MED ORDER — LACTATED RINGERS IV SOLN
INTRAVENOUS | Status: DC
  Administered 2018-11-30 (×2): via INTRAVENOUS

## 2018-11-30 MED ORDER — PROPOFOL 500 MG/50ML IV EMUL
INTRAVENOUS | Status: DC | PRN
  Administered 2018-11-30: 50 ug/kg/min via INTRAVENOUS

## 2018-11-30 MED ORDER — LIDOCAINE 2% (20 MG/ML) 5 ML SYRINGE
INTRAMUSCULAR | Status: AC
  Filled 2018-11-30: qty 5

## 2018-11-30 MED ORDER — GABAPENTIN 300 MG PO CAPS: 300.0000 mg | ORAL_CAPSULE | Freq: Three times a day (TID) | ORAL | Status: DC

## 2018-11-30 MED ORDER — ACETAMINOPHEN 325 MG PO TABS: 325.0000 mg | ORAL_TABLET | Freq: Four times a day (QID) | ORAL | Status: DC | PRN

## 2018-11-30 MED ORDER — PRAVASTATIN SODIUM 20 MG PO TABS
20.0000 mg | ORAL_TABLET | Freq: Every day | ORAL | Status: DC
  Administered 2018-11-30: 20 mg via ORAL
  Filled 2018-11-30: qty 1

## 2018-11-30 MED ORDER — LISINOPRIL 20 MG PO TABS
20.0000 mg | ORAL_TABLET | Freq: Every day | ORAL | Status: DC
  Administered 2018-11-30: 15:00:00 20 mg via ORAL
  Filled 2018-11-30 (×2): qty 1

## 2018-11-30 MED ORDER — ONDANSETRON HCL 4 MG/2ML IJ SOLN
INTRAMUSCULAR | Status: DC | PRN
  Administered 2018-11-30: 4 mg via INTRAVENOUS

## 2018-11-30 MED ORDER — METOCLOPRAMIDE HCL 5 MG PO TABS: 5.0000 mg | ORAL_TABLET | Freq: Three times a day (TID) | ORAL | Status: DC | PRN

## 2018-11-30 MED ORDER — HYDROMORPHONE HCL 1 MG/ML IJ SOLN: 0.5000 mg | INTRAMUSCULAR | Status: DC | PRN

## 2018-11-30 MED ORDER — ALUM & MAG HYDROXIDE-SIMETH 200-200-20 MG/5ML PO SUSP: 30.0000 mL | ORAL | Status: DC | PRN

## 2018-11-30 MED ORDER — ROPIVACAINE HCL 7.5 MG/ML IJ SOLN
INTRAMUSCULAR | Status: DC | PRN
  Administered 2018-11-30 (×4): 5 mL via PERINEURAL

## 2018-11-30 MED ORDER — METOCLOPRAMIDE HCL 5 MG/ML IJ SOLN: 5.0000 mg | Freq: Three times a day (TID) | INTRAMUSCULAR | Status: DC | PRN

## 2018-11-30 MED ORDER — PROPOFOL 500 MG/50ML IV EMUL
INTRAVENOUS | Status: AC
  Filled 2018-11-30: qty 50

## 2018-11-30 MED ORDER — PHENOL 1.4 % MT LIQD
1.0000 | OROMUCOSAL | Status: DC | PRN
  Filled 2018-11-30: qty 177

## 2018-11-30 MED ORDER — FENTANYL CITRATE (PF) 100 MCG/2ML IJ SOLN: 25.0000 ug | INTRAMUSCULAR | Status: DC | PRN

## 2018-11-30 MED ORDER — FENTANYL CITRATE (PF) 100 MCG/2ML IJ SOLN
INTRAMUSCULAR | Status: DC | PRN
  Administered 2018-11-30: 50 ug via INTRAVENOUS

## 2018-11-30 MED ORDER — FLEET ENEMA 7-19 GM/118ML RE ENEM: 1.0000 | ENEMA | Freq: Once | RECTAL | Status: DC | PRN

## 2018-11-30 MED ORDER — ROPIVACAINE HCL 5 MG/ML IJ SOLN
INTRAMUSCULAR | Status: DC | PRN
  Administered 2018-11-30 (×2): 5 mL via PERINEURAL

## 2018-11-30 MED ORDER — METFORMIN HCL ER 500 MG PO TB24
500.0000 mg | ORAL_TABLET | Freq: Two times a day (BID) | ORAL | Status: DC
  Administered 2018-11-30 – 2018-12-01 (×2): 500 mg via ORAL
  Filled 2018-11-30 (×2): qty 1

## 2018-11-30 MED ORDER — CEFAZOLIN SODIUM-DEXTROSE 2-4 GM/100ML-% IV SOLN
2.0000 g | INTRAVENOUS | Status: AC
  Administered 2018-11-30: 2 g via INTRAVENOUS
  Filled 2018-11-30: qty 100

## 2018-11-30 MED ORDER — METHOCARBAMOL 500 MG IVPB - SIMPLE MED
500.0000 mg | Freq: Four times a day (QID) | INTRAVENOUS | Status: DC | PRN
  Filled 2018-11-30: qty 50

## 2018-11-30 MED ORDER — TRANEXAMIC ACID-NACL 1000-0.7 MG/100ML-% IV SOLN
1000.0000 mg | Freq: Once | INTRAVENOUS | Status: AC
  Administered 2018-11-30: 13:00:00 1000 mg via INTRAVENOUS
  Filled 2018-11-30: qty 100

## 2018-11-30 MED ORDER — TRANEXAMIC ACID 1000 MG/10ML IV SOLN
INTRAVENOUS | Status: DC | PRN
  Administered 2018-11-30: 2000 mg via TOPICAL

## 2018-11-30 MED ORDER — OXYCODONE-ACETAMINOPHEN 5-325 MG PO TABS: 1.0000 | ORAL_TABLET | ORAL | 0 refills | Status: DC | PRN

## 2018-11-30 MED ORDER — PROPOFOL 10 MG/ML IV BOLUS
INTRAVENOUS | Status: AC
  Filled 2018-11-30: qty 20

## 2018-11-30 MED ORDER — CANAGLIFLOZIN 100 MG PO TABS
100.0000 mg | ORAL_TABLET | Freq: Every day | ORAL | Status: DC
  Administered 2018-12-01: 100 mg via ORAL
  Filled 2018-11-30: qty 1

## 2018-11-30 MED ORDER — BUPIVACAINE HCL 0.25 % IJ SOLN
INTRAMUSCULAR | Status: DC | PRN
  Administered 2018-11-30: 30 mL

## 2018-11-30 MED ORDER — MENTHOL 3 MG MT LOZG: 1.0000 | LOZENGE | OROMUCOSAL | Status: DC | PRN

## 2018-11-30 MED ORDER — HYPROMELLOSE (GONIOSCOPIC) 2.5 % OP SOLN: 1.0000 [drp] | Freq: Three times a day (TID) | OPHTHALMIC | Status: DC | PRN

## 2018-11-30 MED ORDER — DIPHENHYDRAMINE HCL 12.5 MG/5ML PO ELIX: 12.5000 mg | ORAL_SOLUTION | ORAL | Status: DC | PRN

## 2018-11-30 MED ORDER — ONDANSETRON HCL 4 MG/2ML IJ SOLN: 4.0000 mg | Freq: Once | INTRAMUSCULAR | Status: DC | PRN

## 2018-11-30 MED ORDER — BUPIVACAINE HCL (PF) 0.25 % IJ SOLN
INTRAMUSCULAR | Status: AC
  Filled 2018-11-30: qty 30

## 2018-11-30 MED ORDER — PANTOPRAZOLE SODIUM 40 MG PO TBEC
40.0000 mg | DELAYED_RELEASE_TABLET | Freq: Every day | ORAL | Status: DC
  Administered 2018-11-30 – 2018-12-01 (×2): 40 mg via ORAL
  Filled 2018-11-30 (×2): qty 1

## 2018-11-30 MED ORDER — ASPIRIN EC 81 MG PO TBEC: 81.0000 mg | DELAYED_RELEASE_TABLET | Freq: Two times a day (BID) | ORAL | 0 refills | Status: AC

## 2018-11-30 MED ORDER — SODIUM CHLORIDE (PF) 0.9 % IJ SOLN
INTRAMUSCULAR | Status: AC
  Filled 2018-11-30: qty 50

## 2018-11-30 MED ORDER — POVIDONE-IODINE 10 % EX SWAB
2.0000 "application " | Freq: Once | CUTANEOUS | Status: AC
  Administered 2018-11-30: 2 via TOPICAL

## 2018-11-30 SURGICAL SUPPLY — 48 items
ATTUNE PSFEM RTSZ5 NARCEM KNEE (Femur) ×2 IMPLANT
ATTUNE PSRP INSR SZ5 5 KNEE (Insert) ×2 IMPLANT
BAG DECANTER FOR FLEXI CONT (MISCELLANEOUS) ×2 IMPLANT
BAG ZIPLOCK 12X15 (MISCELLANEOUS) ×2 IMPLANT
BASE TIBIAL ROT PLAT SZ 5 KNEE (Knees) ×1 IMPLANT
BLADE SAG 18X100X1.27 (BLADE) ×2 IMPLANT
BLADE SAW SGTL 11.0X1.19X90.0M (BLADE) ×2 IMPLANT
BLADE SURG SZ10 CARB STEEL (BLADE) ×4 IMPLANT
BNDG ELASTIC 6X10 VLCR STRL LF (GAUZE/BANDAGES/DRESSINGS) ×2 IMPLANT
BOWL SMART MIX CTS (DISPOSABLE) ×2 IMPLANT
CEMENT HV SMART SET (Cement) ×4 IMPLANT
COVER SURGICAL LIGHT HANDLE (MISCELLANEOUS) ×2 IMPLANT
COVER WAND RF STERILE (DRAPES) IMPLANT
CUFF TOURN SGL QUICK 34 (TOURNIQUET CUFF) ×1
CUFF TRNQT CYL 34X4.125X (TOURNIQUET CUFF) ×1 IMPLANT
DECANTER SPIKE VIAL GLASS SM (MISCELLANEOUS) ×6 IMPLANT
DRAPE U-SHAPE 47X51 STRL (DRAPES) ×2 IMPLANT
DRSG AQUACEL AG ADV 3.5X10 (GAUZE/BANDAGES/DRESSINGS) ×2 IMPLANT
DURAPREP 26ML APPLICATOR (WOUND CARE) ×2 IMPLANT
ELECT REM PT RETURN 15FT ADLT (MISCELLANEOUS) ×2 IMPLANT
GLOVE BIO SURGEON STRL SZ7.5 (GLOVE) ×2 IMPLANT
GLOVE BIO SURGEON STRL SZ8.5 (GLOVE) ×2 IMPLANT
GLOVE BIOGEL PI IND STRL 8 (GLOVE) ×1 IMPLANT
GLOVE BIOGEL PI IND STRL 9 (GLOVE) ×1 IMPLANT
GLOVE BIOGEL PI INDICATOR 8 (GLOVE) ×1
GLOVE BIOGEL PI INDICATOR 9 (GLOVE) ×1
GOWN STRL REUS W/TWL XL LVL3 (GOWN DISPOSABLE) ×4 IMPLANT
HANDPIECE INTERPULSE COAX TIP (DISPOSABLE) ×1
HOOD PEEL AWAY FLYTE STAYCOOL (MISCELLANEOUS) ×6 IMPLANT
KIT TURNOVER KIT A (KITS) IMPLANT
NEEDLE HYPO 21X1.5 SAFETY (NEEDLE) ×4 IMPLANT
NS IRRIG 1000ML POUR BTL (IV SOLUTION) ×2 IMPLANT
PACK ICE MAXI GEL EZY WRAP (MISCELLANEOUS) ×2 IMPLANT
PACK TOTAL KNEE CUSTOM (KITS) ×2 IMPLANT
PATELLA MEDIAL ATTUN 35MM KNEE (Knees) ×2 IMPLANT
PIN DRILL FIX HALF THREAD (BIT) ×2 IMPLANT
PIN STEINMAN FIXATION KNEE (PIN) ×2 IMPLANT
PROTECTOR NERVE ULNAR (MISCELLANEOUS) ×2 IMPLANT
SET HNDPC FAN SPRY TIP SCT (DISPOSABLE) ×1 IMPLANT
SUT VIC AB 1 CTX 36 (SUTURE) ×1
SUT VIC AB 1 CTX36XBRD ANBCTR (SUTURE) ×1 IMPLANT
SUT VIC AB 3-0 CT1 27 (SUTURE) ×3
SUT VIC AB 3-0 CT1 TAPERPNT 27 (SUTURE) ×3 IMPLANT
SYR CONTROL 10ML LL (SYRINGE) ×4 IMPLANT
TIBIAL BASE ROT PLAT SZ 5 KNEE (Knees) ×2 IMPLANT
TRAY FOLEY MTR SLVR 16FR STAT (SET/KITS/TRAYS/PACK) ×2 IMPLANT
WATER STERILE IRR 1000ML POUR (IV SOLUTION) ×4 IMPLANT
YANKAUER SUCT BULB TIP 10FT TU (MISCELLANEOUS) ×2 IMPLANT

## 2018-11-30 NOTE — Interval H&P Note (Signed)
History and Physical Interval Note:  11/30/2018 7:11 AM  Deanna Watson  has presented today for surgery, with the diagnosis of RIGHT KNEE OSTEOARTHRITIS.  The various methods of treatment have been discussed with the patient and family. After consideration of risks, benefits and other options for treatment, the patient has consented to  Procedure(s): RIGHT TOTAL KNEE ARTHROPLASTY (Right) as a surgical intervention.  The patient's history has been reviewed, patient examined, no change in status, stable for surgery.  I have reviewed the patient's chart and labs.  Questions were answered to the patient's satisfaction.     Kerin Salen

## 2018-11-30 NOTE — Anesthesia Procedure Notes (Signed)
Spinal  Patient location during procedure: OR Start time: 11/30/2018 7:28 AM End time: 11/30/2018 7:33 AM Staffing Resident/CRNA: Garrel Ridgel, CRNA Performed: resident/CRNA  Preanesthetic Checklist Completed: patient identified, site marked, surgical consent, pre-op evaluation, timeout performed, IV checked, risks and benefits discussed and monitors and equipment checked Spinal Block Patient position: sitting Prep: DuraPrep Patient monitoring: heart rate, cardiac monitor, continuous pulse ox and blood pressure Approach: midline Location: L3-4 Injection technique: single-shot Needle Needle type: Spinocan  Needle gauge: 24 G Needle length: 9 cm Needle insertion depth: 5 cm Assessment Sensory level: T10

## 2018-11-30 NOTE — Addendum Note (Signed)
Addendum  created 11/30/18 1112 by Lyn Hollingshead, MD   Intraprocedure Meds edited

## 2018-11-30 NOTE — Anesthesia Procedure Notes (Addendum)
Anesthesia Regional Block: Adductor canal block   Pre-Anesthetic Checklist: ,, timeout performed, Correct Patient, Correct Site, Correct Laterality, Correct Procedure, Correct Position, site marked, Risks and benefits discussed,  Surgical consent,  Pre-op evaluation,  At surgeon's request and post-op pain management  Laterality: Lower and Right  Prep: chloraprep       Needles:  Injection technique: Single-shot  Needle Type: Echogenic Stimulator Needle     Needle Length: 10cm  Needle Gauge: 21   Needle insertion depth: 3 cm   Additional Needles:   Procedures:,,,, ultrasound used (permanent image in chart),,,,  Narrative:  Start time: 11/30/2018 7:01 AM End time: 11/30/2018 7:10 AM Injection made incrementally with aspirations every 5 mL.  Performed by: Personally  Anesthesiologist: Lyn Hollingshead, MD

## 2018-11-30 NOTE — Progress Notes (Signed)
Dinner cbg obtained and read 404.  PA paged and made aware.  Stat lab verification ordered.  Orders to change fluids as well.  See MAR.  Orders to administer 15units insulin per sliding scale. RN will monitor.

## 2018-11-30 NOTE — Plan of Care (Signed)

## 2018-11-30 NOTE — Anesthesia Postprocedure Evaluation (Addendum)
Anesthesia Post Note  Patient: Satoria Tokuda  Procedure(s) Performed: RIGHT TOTAL KNEE ARTHROPLASTY (Right Knee)     Patient location during evaluation: PACU Anesthesia Type: Spinal Level of consciousness: awake Pain management: pain level controlled Vital Signs Assessment: post-procedure vital signs reviewed and stable Respiratory status: spontaneous breathing Cardiovascular status: stable Postop Assessment: no headache, no backache, spinal receding and no apparent nausea or vomiting Anesthetic complications: no    Last Vitals:  Vitals:   11/30/18 0945 11/30/18 1034  BP: (!) 146/69 134/66  Pulse: (!) 56 (!) 56  Resp: 14 18  Temp: 36.7 C 36.6 C  SpO2: 100% 100%    Last Pain:  Vitals:   11/30/18 1034  TempSrc: Oral  PainSc:    Pain Goal: Patients Stated Pain Goal: 4 (11/30/18 0605)  LLE Motor Response: (P) Purposeful movement (11/30/18 1030) LLE Sensation: (P) Numbness (11/30/18 1030) RLE Motor Response: (P) Purposeful movement (11/30/18 1030) RLE Sensation: (P) Numbness (11/30/18 1030)        Huston Foley

## 2018-11-30 NOTE — Discharge Instructions (Signed)

## 2018-11-30 NOTE — Transfer of Care (Signed)
Immediate Anesthesia Transfer of Care Note  Patient: Deanna Watson  Procedure(s) Performed: RIGHT TOTAL KNEE ARTHROPLASTY (Right Knee)  Patient Location: PACU  Anesthesia Type:Spinal  Level of Consciousness: awake, alert , oriented and patient cooperative  Airway & Oxygen Therapy: Patient Spontanous Breathing and Patient connected to face mask oxygen  Post-op Assessment: Report given to RN and Post -op Vital signs reviewed and stable  Post vital signs: Reviewed and stable  Last Vitals:  Vitals Value Taken Time  BP 133/65 11/30/18 0922  Temp    Pulse 58 11/30/18 0925  Resp 16 11/30/18 0925  SpO2 100 % 11/30/18 0925  Vitals shown include unvalidated device data.  Last Pain:  Vitals:   11/30/18 0610  TempSrc: Oral  PainSc:       Patients Stated Pain Goal: 4 ([id number])  Complications: No apparent anesthesia complications

## 2018-11-30 NOTE — Evaluation (Addendum)
Physical Therapy Evaluation Patient Details Name: Deanna Watson MRN: [id number] DOB: Jul 24, 1950 Today's Date: 11/30/2018   History of Present Illness  Patient is 68 y.o. female s/p Rt TKA on 11/30/18 with PMH significant for WPW syndrome, HTN, HLD, GERD, fibromyalgia, DM, COPD, and prior Lt TKA in 2005.    Clinical Impression  Deanna Watson is a 68 y.o. female POD 0 s/p Rt TKA. Patient reports modified independence with rollator for mobility at baseline. Patient is now limited by functional impairments (see PT problem list below) and requires min assist for transfers and gait with RW. Patient was able to ambulate ~60 feet with RW and min assist to steady throughout. Patient instructed in exercise to facilitate ROM and circulation to manage edema. Patient will benefit from continued skilled PT interventions to address impairments and progress towards PLOF. Acute PT will follow to progress mobility and stair training in preparation for safe discharge home.     Follow Up Recommendations Follow surgeon's recommendation for DC plan and follow-up therapies    Equipment Recommendations  Rolling walker with 5" wheels(short walker)    Recommendations for Other Services       Precautions / Restrictions Precautions Precautions: Fall Restrictions Weight Bearing Restrictions: No      Mobility  Bed Mobility Overal bed mobility: Needs Assistance Bed Mobility: Supine to Sit     Supine to sit: HOB elevated;Supervision     General bed mobility comments: cues for sequencing, pt using bed rails  Transfers Overall transfer level: Needs assistance Equipment used: Rolling walker (2 wheeled) Transfers: Sit to/from Stand Sit to Stand: Min assist         General transfer comment: cues for safe hand placement and technique with RW, assist for power up and to steady upon rising  Ambulation/Gait Ambulation/Gait assistance: Min assist Gait Distance (Feet): 60 Feet Assistive device:  Rolling walker (2 wheeled) Gait Pattern/deviations: Step-through pattern;Decreased step length - left;Decreased step length - right;Decreased stride length;Decreased weight shift to right Gait velocity: decreased   General Gait Details: pt requires cues to maintain safe proximity to RW throughout, pt wtih slight unsteadiness initially requiring min assist to steady  Science writer    Modified Rankin (Stroke Patients Only)       Balance Overall balance assessment: Needs assistance Sitting-balance support: No upper extremity supported;Feet supported Sitting balance-Leahy Scale: Good     Standing balance support: During functional activity;Bilateral upper extremity supported Standing balance-Leahy Scale: Poor                               Pertinent Vitals/Pain Pain Assessment: No/denies pain    Home Living Family/patient expects to be discharged to:: Private residence Living Arrangements: Spouse/significant other Available Help at Discharge: Family;Available 24 hours/day;Available PRN/intermittently(husband available 24/7, pt's daughter and granddaughter available intermittent) Type of Home: House Home Access: Stairs to enter   [employer] of Steps: 1 step (no rails, pt has column on one side) Home Layout: One level Home Equipment: Walker - 4 wheels;Cane - single point      Prior Function Level of Independence: Independent with assistive device(s)         Comments: pt mobilizes with rollator at baseline     Hand Dominance        Extremity/Trunk Assessment   Upper Extremity Assessment Upper Extremity Assessment: Overall WFL for tasks assessed    Lower Extremity  Assessment Lower Extremity Assessment: Generalized weakness;RLE deficits/detail RLE Deficits / Details: pt with good quad activation, 4/5 with LAQ, pt reporting numbness persists at her posterior thigh and butt RLE Sensation: decreased light  touch RLE Coordination: WNL    Cervical / Trunk Assessment Cervical / Trunk Assessment: Normal  Communication   Communication: No difficulties  Cognition Arousal/Alertness: Awake/alert Behavior During Therapy: WFL for tasks assessed/performed Overall Cognitive Status: Within Functional Limits for tasks assessed               General Comments      Exercises Total Joint Exercises Ankle Circles/Pumps: AROM;Both;Seated;10 reps Quad Sets: AROM;10 reps;Supine;Right Heel Slides: AROM;10 reps;Supine;Right   Assessment/Plan    PT Assessment Patient needs continued PT services  PT Problem List Decreased range of motion;Decreased strength;Decreased balance;Decreased mobility;Decreased coordination;Decreased activity tolerance;Decreased knowledge of use of DME       PT Treatment Interventions DME instruction;Functional mobility training;Balance training;Patient/family education;Modalities;Therapeutic activities;Gait training;Stair training;Therapeutic exercise    PT Goals (Current goals can be found in the Care Plan section)  Acute Rehab PT Goals Patient Stated Goal: to improve walking and return home PT Goal Formulation: With patient Time For Goal Achievement: 12/07/18 Potential to Achieve Goals: Good    Frequency 7X/week    AM-PAC PT "6 Clicks" Mobility  Outcome Measure Help needed turning from your back to your side while in a flat bed without using bedrails?: A Little Help needed moving from lying on your back to sitting on the side of a flat bed without using bedrails?: A Little Help needed moving to and from a bed to a chair (including a wheelchair)?: A Little Help needed standing up from a chair using your arms (e.g., wheelchair or bedside chair)?: A Little Help needed to walk in hospital room?: A Little Help needed climbing 3-5 steps with a railing? : A Little 6 Click Score: 18    End of Session Equipment Utilized During Treatment: Gait belt Activity Tolerance:  Patient tolerated treatment well Patient left: with call bell/phone within reach;in chair;with chair alarm set;with family/visitor present Nurse Communication: Mobility status PT Visit Diagnosis: Other abnormalities of gait and mobility (R26.89);Muscle weakness (generalized) (M62.81);Difficulty in walking, not elsewhere classified (R26.2);Unsteadiness on feet (R26.81)    Time: [id number] PT Time Calculation (min) (ACUTE ONLY): 30 min   Charges:   PT Evaluation $PT Eval Low Complexity: 1 Low PT Treatments $Therapeutic Exercise: 8-22 mins      Kipp Brood, PT, DPT Physical Therapist with [facility]  11/30/2018 3:15 PM

## 2018-11-30 NOTE — Op Note (Signed)
PATIENT ID:      Deanna Watson  MRN:     [id number] DOB/AGE:    November 16, 1950 / 68 y.o.       OPERATIVE REPORT   DATE OF PROCEDURE:  11/30/2018      PREOPERATIVE DIAGNOSIS:   RIGHT KNEE OSTEOARTHRITIS      Estimated body mass index is 32.03 kg/m as calculated from the following:   Height as of this encounter: 5' (1.524 m).   Weight as of this encounter: 74.4 kg.                                                       POSTOPERATIVE DIAGNOSIS:   Same                                                                   PROCEDURE:  Procedure(s): RIGHT TOTAL KNEE ARTHROPLASTY Using DepuyAttune RP implants #5R Femur, #5Tibia, 5 mm Attune RP bearing, 35 Patella    SURGEON: Kerin Salen  ASSISTANT:   Kerry Hough. [employer]   (Present and scrubbed throughout the case, critical for assistance with exposure, retraction, instrumentation, and closure.)        ANESTHESIA: Spinal, 20cc Exparel, 50cc 0.25% Marcaine EBL: 300 cc FLUID REPLACEMENT: 1500 cc crystaloid TOURNIQUET: DRAINS: None TRANEXAMIC ACID: 1gm IV, 2gm topical COMPLICATIONS:  None         INDICATIONS FOR PROCEDURE: The patient has  RIGHT KNEE OSTEOARTHRITIS, Var deformities, XR shows bone on bone arthritis, lateral subluxation of tibia. Patient has failed all conservative measures including anti-inflammatory medicines, narcotics, attempts at exercise and weight loss, cortisone injections and viscosupplementation.  Risks and benefits of surgery have been discussed, questions answered.   DESCRIPTION OF PROCEDURE: The patient identified by armband, received  IV antibiotics, in the holding area at [facility]. Patient taken to the operating room, appropriate anesthetic monitors were attached, and Sp9inal anesthesia was  induced. IV Tranexamic acid was given.Tourniquet applied high to the operative thigh. Lateral post and foot positioner applied to the table, the lower extremity was then prepped and draped in usual sterile fashion from the  toes to the tourniquet. Time-out procedure was performed. The skin and subcutaneous tissue along the incision was injected with 20 cc of a mixture of Exparel and Marcaine solution, using a 20-gauge by 1-1/2 inch needle. We began the operation, with the knee flexed 130 degrees, by making the anterior midline incision starting at handbreadth above the patella going over the patella 1 cm medial to and 4 cm distal to the tibial tubercle. Small bleeders in the skin and the subcutaneous tissue identified and cauterized. Transverse retinaculum was incised and reflected medially and a medial parapatellar arthrotomy was accomplished. the patella was everted and theprepatellar fat pad resected. The superficial medial collateral ligament was then elevated from anterior to posterior along the proximal flare of the tibia and anterior half of the menisci resected. The knee was hyperflexed exposing bone on bone arthritis. Peripheral and notch osteophytes as well as the cruciate ligaments were then resected. We continued to work our way around posteriorly along  the proximal tibia, and externally rotated the tibia subluxing it out from underneath the femur. A McHale PCL retractor was placed through the notch and a lateral Hohmann retractor placed, and we then entered the proximal tibia in line with the Depuy starter drill in line with the axis of the tibia followed by an intramedullary guide rod and 0-degree posterior slope cutting guide. The tibial cutting guide, 4 degree posterior sloped, was pinned into place allowing resection of 5 mm of bone medially and 10 mm of bone laterally. Satisfied with the tibial resection, we then entered the distal femur 2 mm anterior to the PCL origin with the intramedullary guide rod and applied the distal femoral cutting guide set at 9 mm, with 5 degrees of valgus. This was pinned along the epicondylar axis. At this point, the distal femoral cut was accomplished without difficulty. We then sized  for a #5R femoral component and pinned the guide in 3 degrees of external rotation. The chamfer cutting guide was pinned into place. The anterior, posterior, and chamfer cuts were accomplished without difficulty followed by the Attune RP box cutting guide and the box cut. We also removed posterior osteophytes from the posterior femoral condyles. The posterior capsule was injected with Exparel solution. The knee was brought into full extension. We checked our extension gap and fit a 5 mm bearing. Distracting in extension with a lamina spreader,  bleeders in the posterior capsule, Posterior medial and posterior lateral gutter were cauterized.  The transexamic acid-soaked sponge was then placed in the gap of the knee in extension. The knee was flexed 30. The posterior patella cut was accomplished with the 9.5 mm Attune cutting guide, sized for a 69mm dome, and the fixation pegs drilled.The knee was then once again hyperflexed exposing the proximal tibia. We sized for a # 5 tibial base plate, applied the smokestack and the conical reamer followed by the the Delta fin keel punch. We then hammered into place the Attune RP trial femoral component, drilled the lugs, inserted a  5 mm trial bearing, trial patellar button, and took the knee through range of motion from 0-130 degrees. Medial and lateral ligamentous stability was checked. No thumb pressure was required for patellar Tracking. The tourniquet was not used. All trial components were removed, mating surfaces irrigated with pulse lavage, and dried with suction and sponges. 10 cc of the Exparel solution was applied to the cancellus bone of the patella distal femur and proximal tibia.  After waiting 30 seconds, the bony surfaces were again, dried with sponges. A double batch of DePuy HV cement was mixed and applied to all bony metallic mating surfaces except for the posterior condyles of the femur itself. In order, we hammered into place the tibial tray and removed  excess cement, the femoral component and removed excess cement. The final Attune RP bearing was inserted, and the knee brought to full extension with compression. The patellar button was clamped into place, and excess cement removed. The knee was held at 30 flexion with compression, while the cement cured. The wound was irrigated out with normal saline solution pulse lavage. The rest of the Exparel was injected into the parapatellar arthrotomy, subcutaneous tissues, and periosteal tissues. The parapatellar arthrotomy was closed with running #1 Vicryl suture. The subcutaneous tissue with 0 and 2-0 undyed Vicryl suture, and the skin with running 3-0 SQ vicryl. An Aquacil and Ace wrap were applied. The patient was taken to recovery room without difficulty.   Kerin Salen 11/30/2018,  7:12 AM

## 2018-12-01 ENCOUNTER — Encounter (HOSPITAL_COMMUNITY): Payer: Self-pay | Admitting: Orthopedic Surgery

## 2018-12-01 DIAGNOSIS — E782 Mixed hyperlipidemia: Secondary | ICD-10-CM | POA: Diagnosis not present

## 2018-12-01 DIAGNOSIS — I10 Essential (primary) hypertension: Secondary | ICD-10-CM | POA: Diagnosis not present

## 2018-12-01 DIAGNOSIS — E11319 Type 2 diabetes mellitus with unspecified diabetic retinopathy without macular edema: Secondary | ICD-10-CM | POA: Diagnosis not present

## 2018-12-01 DIAGNOSIS — E1141 Type 2 diabetes mellitus with diabetic mononeuropathy: Secondary | ICD-10-CM | POA: Diagnosis not present

## 2018-12-01 DIAGNOSIS — M797 Fibromyalgia: Secondary | ICD-10-CM | POA: Diagnosis not present

## 2018-12-01 DIAGNOSIS — E1151 Type 2 diabetes mellitus with diabetic peripheral angiopathy without gangrene: Secondary | ICD-10-CM | POA: Diagnosis not present

## 2018-12-01 DIAGNOSIS — J449 Chronic obstructive pulmonary disease, unspecified: Secondary | ICD-10-CM | POA: Diagnosis not present

## 2018-12-01 DIAGNOSIS — F330 Major depressive disorder, recurrent, mild: Secondary | ICD-10-CM | POA: Diagnosis not present

## 2018-12-01 DIAGNOSIS — Z96651 Presence of right artificial knee joint: Secondary | ICD-10-CM | POA: Diagnosis not present

## 2018-12-01 DIAGNOSIS — M1711 Unilateral primary osteoarthritis, right knee: Secondary | ICD-10-CM | POA: Diagnosis not present

## 2018-12-01 LAB — BASIC METABOLIC PANEL
Anion gap: 7 (ref 5–15)
BUN: 26 mg/dL — ABNORMAL HIGH (ref 8–23)
CO2: 25 mmol/L (ref 22–32)
Calcium: 8.5 mg/dL — ABNORMAL LOW (ref 8.9–10.3)
Chloride: 103 mmol/L (ref 98–111)
Creatinine, Ser: 0.82 mg/dL (ref 0.44–1.00)
GFR calc Af Amer: >60 mL/min (ref >60)
GFR calc non Af Amer: >60 mL/min (ref >60)
Glucose, Bld: 170 mg/dL — ABNORMAL HIGH (ref 70–99)
Potassium: 4.4 mmol/L (ref 3.5–5.1)
Sodium: 135 mmol/L (ref 135–145)

## 2018-12-01 LAB — GLUCOSE, CAPILLARY
Glucose-Capillary: 152 mg/dL — ABNORMAL HIGH (ref 70–99)
Glucose-Capillary: 158 mg/dL — ABNORMAL HIGH (ref 70–99)

## 2018-12-01 LAB — CBC
HCT: 29.6 % — ABNORMAL LOW (ref 36.0–46.0)
Hemoglobin: 9.3 g/dL — ABNORMAL LOW (ref 12.0–15.0)
MCH: 29.2 pg (ref 26.0–34.0)
MCHC: 31.4 g/dL (ref 30.0–36.0)
MCV: 92.8 fL (ref 80.0–100.0)
Platelets: 155 10*3/uL (ref 150–400)
RBC: 3.19 MIL/uL — ABNORMAL LOW (ref 3.87–5.11)
RDW: 11.9 % (ref 11.5–15.5)
WBC: 10 10*3/uL (ref 4.0–10.5)
nRBC: 0 % (ref 0.0–0.2)

## 2018-12-01 MED ORDER — SODIUM CHLORIDE 0.9 % IV BOLUS
500.0000 mL | Freq: Once | INTRAVENOUS | Status: AC
  Administered 2018-12-01: 12:00:00 500 mL via INTRAVENOUS

## 2018-12-01 NOTE — Plan of Care (Signed)

## 2018-12-01 NOTE — Discharge Summary (Signed)
Patient ID: Deanna Watson MRN: [id number] DOB/AGE: [birth year] 68 y.o.  Admit date: 11/30/2018 Discharge date: 12/01/2018  Admission Diagnoses:  Principal Problem:   Osteoarthritis of right knee Active Problems:   S/P total knee replacement, right   Discharge Diagnoses:  Same  Past Medical History:  Diagnosis Date  . Allergy   . Anemia    history of  . Arthritis   . Asthma    well controlled  . Bell's palsy    lasted only about 2 days  . Chicken pox   . Chronic bronchitis ([place])   . COPD (chronic obstructive pulmonary disease) ([place])   . Depression   . Diabetes mellitus without complication ([place])   . Dysrhythmia    hx of WPW now resolved but has palpatations  . Fatty liver    mild  . Fibromyalgia   . GERD (gastroesophageal reflux disease)   . History of blood transfusion   . Hyperlipidemia   . Hypertension   . Mild cardiomegaly   . Peripheral neuropathy   . PONV (postoperative nausea and vomiting)   . Sciatica   . WPW (Wolff-Parkinson-White syndrome)     Surgeries: Procedure(s): RIGHT TOTAL KNEE ARTHROPLASTY on 11/30/2018   Consultants:   Discharged Condition: Improved  Hospital Course: Deanna Watson is an 68 y.o. female who was admitted 11/30/2018 for operative treatment ofOsteoarthritis of right knee. Patient has severe unremitting pain that affects sleep, daily activities, and work/hobbies. After pre-op clearance the patient was taken to the operating room on 11/30/2018 and underwent  Procedure(s): RIGHT TOTAL KNEE ARTHROPLASTY.    Patient was given perioperative antibiotics:  Anti-infectives (From admission, onward)   Start     Dose/Rate Route Frequency Ordered Stop   11/30/18 0600  ceFAZolin (ANCEF) IVPB 2g/100 mL premix     2 g 200 mL/hr over 30 Minutes Intravenous On call to O.R. 11/30/18 [id number] 11/30/18 0734       Patient was given sequential compression devices, early ambulation, and chemoprophylaxis to prevent DVT.  Patient benefited  maximally from hospital stay and there were no complications.    Recent vital signs:  Patient Vitals for the past 24 hrs:  BP Temp Temp src Pulse Resp SpO2  12/01/18 0538 109/60 97.7 F (36.5 C) Oral 61 17 100 %  12/01/18 0221 (Abnormal) 122/59 98.1 F (36.7 C) Oral 60 18 99 %  11/30/18 2305 no documentation no documentation no documentation no documentation no documentation 98 %  11/30/18 2226 135/63 98.3 F (36.8 C) Oral 63 18 100 %  11/30/18 1736 (Abnormal) 141/59 98.2 F (36.8 C) Oral 69 16 99 %  11/30/18 1357 123/63 (Abnormal) 97.5 F (36.4 C) Oral 67 16 100 %  11/30/18 1245 138/63 no documentation no documentation 60 16 100 %  11/30/18 1141 126/62 97.6 F (36.4 C) Oral (Abnormal) 58 16 100 %  11/30/18 1034 134/66 97.8 F (36.6 C) Oral (Abnormal) 56 18 100 %  11/30/18 0945 (Abnormal) 146/69 98 F (36.7 C) no documentation (Abnormal) 56 14 100 %  11/30/18 0930 (Abnormal) 143/70 no documentation no documentation (Abnormal) 57 14 100 %  11/30/18 0922 133/65 98 F (36.7 C) no documentation (Abnormal) 56 14 100 %     Recent laboratory studies:  Recent Labs    11/30/18 1757 12/01/18 0326  WBC  --  10.0  HGB  --  9.3*  HCT  --  29.6*  PLT  --  155  NA  --  135  K  --  4.4  CL  --  103  CO2  --  25  BUN  --  26*  CREATININE  --  0.82  GLUCOSE 413* 170*  CALCIUM  --  8.5*     Discharge Medications:   Allergies as of 12/01/2018   No Known Allergies     Medication List    Stop taking these medications   acetaminophen 500 MG tablet Commonly known as: TYLENOL     Take these medications   albuterol (2.5 MG/3ML) 0.083% nebulizer solution Commonly known as: PROVENTIL Take 3 mLs (2.5 mg total) by nebulization every 6 (six) hours as needed for wheezing or shortness of breath.   aspirin EC 81 MG tablet Take 1 tablet (81 mg total) by mouth 2 (two) times daily.   BIOFREEZE EX Apply 1 application topically 2 (two) times daily as needed (pain).    budesonide-formoterol 160-4.5 MCG/ACT inhaler Commonly known as: SYMBICORT Inhale 1 puff into the lungs 2 (two) times daily.   calcium carbonate 500 MG chewable tablet Commonly known as: TUMS - dosed in mg elemental calcium Chew 1-2 tablets by mouth daily as needed for indigestion or heartburn.   celecoxib 200 MG capsule Commonly known as: CELEBREX Take 1 capsule (200 mg total) by mouth daily.   escitalopram 20 MG tablet Commonly known as: LEXAPRO Take 1 tablet (20 mg total) by mouth daily.   gabapentin 300 MG capsule Commonly known as: NEURONTIN TAKE 1 CAPSULE THREE TIMES DAILY What changed:   how much to take  how to take this  when to take this  additional instructions   glucose blood test strip Use to check blood sugars 2 times daily. Dx Code- E11.41   hydroxypropyl methylcellulose / hypromellose 2.5 % ophthalmic solution Commonly known as: ISOPTO TEARS / GONIOVISC Place 1 drop into both eyes 3 (three) times daily as needed for dry eyes.   Jardiance 25 MG Tabs tablet Generic drug: empagliflozin Take 1 tablet by mouth once daily What changed: how much to take   lisinopril-hydrochlorothiazide 20-12.5 MG tablet Commonly known as: ZESTORETIC Take 1 tablet by mouth daily.   lovastatin 10 MG tablet Commonly known as: MEVACOR Take 1 tablet (10 mg total) by mouth at bedtime.   metFORMIN 500 MG 24 hr tablet Commonly known as: GLUCOPHAGE-XR Take 2 tablets (1,000 mg total) by mouth 2 (two) times daily. What changed: how much to take   omeprazole 20 MG capsule Commonly known as: PRILOSEC Take 2 capsules (40 mg total) by mouth daily.   oxyCODONE-acetaminophen 5-325 MG tablet Commonly known as: PERCOCET/ROXICET Take 1 tablet by mouth every 4 (four) hours as needed for severe pain.   SUPER B COMPLEX PO Take 1 tablet by mouth daily.   TIGER BALM MUSCLE RUB EX Apply 1 application topically daily as needed (pain).   tiZANidine 2 MG tablet Commonly known as:  ZANAFLEX Take 1 tablet (2 mg total) by mouth every 6 (six) hours as needed.        Durable Medical Equipment  (From admission, onward)         Start     Ordered   11/30/18 0956  DME Walker rolling  Once    Question:  Patient needs a walker to treat with the following condition  Answer:  Status post right knee replacement   11/30/18 0955   11/30/18 0956  DME 3 n 1  Once     11/30/18 0955           Discharge Care Instructions  (  From admission, onward)         Start     Ordered   12/01/18 0000  Change dressing    Comments: Change dressing Only if drainage exceeds 40% of window on dressing   12/01/18 0716          Diagnostic Studies: Dg Chest 2 View  Result Date: 11/24/2018 CLINICAL DATA:  Preoperative study for knee replacement. EXAM: CHEST - 2 VIEW COMPARISON:  10/27/2017 FINDINGS: Heart size is mild to moderately enlarged, unchanged from prior study. Calcifications are noted in the aortic arch. Hazy opacities project over the left mid chest, not present on the previous exam. Degenerative changes are present in the spine. IMPRESSION: Ill-defined left upper lobe opacities may represent sequela of infection or ground-glass nodules. Consider chest CT for further assessment. No signs of dense consolidation. No pleural effusion. Mild cardiomegaly. These results will be called to the ordering clinician or representative by the Radiologist Assistant, and communication documented in the PACS or zVision Dashboard. Electronically Signed   By: Zetta Bills M.D.   On: 11/24/2018 08:44   Ct Chest W Contrast  Result Date: 11/27/2018 CLINICAL DATA:  Left lung ground-glass opacities on preoperative chest radiograph. No current complaints. EXAM: CT CHEST WITH CONTRAST TECHNIQUE: Multidetector CT imaging of the chest was performed during intravenous contrast administration. CONTRAST:  62mL ISOVUE-300 IOPAMIDOL (ISOVUE-300) INJECTION 61% COMPARISON:  Chest radiographs 11/23/2018 and  11/06/2017. Abdominal CT 01/02/2018. FINDINGS: Cardiovascular: Moderate atherosclerosis of the aorta, great vessels and coronary arteries. No acute vascular findings. The heart size is normal. There is no pericardial effusion. Mediastinum/Nodes: There are no enlarged mediastinal, hilar or axillary lymph nodes.There is a small hiatal hernia. The thyroid gland and trachea appear normal. Lungs/Pleura: There is no pleural effusion or pneumothorax. There is mild emphysema with scattered pulmonary scarring. In addition, there are scattered non focal ground-glass opacities in both upper lobes and the right middle lobe. There is scattered predominately subpleural nodularity in both upper lobes, including a 6 mm solid nodule in the left upper lobe on image 23/3. No confluent airspace opacity. Upper abdomen: Mild subjective hepatic steatosis. No acute findings. Musculoskeletal/Chest wall: There is no chest wall mass or suspicious osseous finding. Mild thoracic spondylosis. IMPRESSION: 1. Nonspecific scattered non focal ground-glass opacities in both upper lobes and right middle lobe, likely inflammatory/postinflammatory. Of note, novel coronavirus was not detected on screening performed yesterday. 2. Scattered nonspecific pulmonary scarring and nodularity. Non-contrast chest CT at 3-6 months is recommended. If the nodules are stable at time of repeat CT, then future CT at 18-24 months (from today's scan) is considered optional for low-risk patients, but is recommended for high-risk patients. This recommendation follows the consensus statement: Guidelines for Management of Incidental Pulmonary Nodules Detected on CT Images: From the Fleischner Society 2017; Radiology 2017; 284:228-243. 3. Mild hepatic steatosis. 4. Aortic Atherosclerosis (ICD10-I70.0) and Emphysema (ICD10-J43.9). Electronically Signed   By: Richardean Sale M.D.   On: 11/27/2018 10:14    Disposition: Discharge disposition: 01-Home or Self  Care       Discharge Instructions    Call MD / Call 911   Complete by: As directed    If you experience chest pain or shortness of breath, CALL 911 and be transported to the hospital emergency room.  If you develope a fever above 101 F, pus (white drainage) or increased drainage or redness at the wound, or calf pain, call your surgeon's office.   Change dressing   Complete by: As directed  Change dressing Only if drainage exceeds 40% of window on dressing   Constipation Prevention   Complete by: As directed    Drink plenty of fluids.  Prune juice may be helpful.  You may use a stool softener, such as Colace (over the counter) 100 mg twice a day.  Use MiraLax (over the counter) for constipation as needed.   Diet - low sodium heart healthy   Complete by: As directed    Increase activity slowly as tolerated   Complete by: As directed       Follow-up Information    Frederik Pear, MD In 2 weeks.   Specialty: Orthopedic Surgery Contact information: [place] [address] [phone]            Signed: Kerin Salen 12/01/2018, 7:19 AM

## 2018-12-01 NOTE — Progress Notes (Signed)
Physical Therapy Treatment Patient Details Name: Deanna Watson MRN: [id number] DOB: 07-Dec-1950 Today's Date: 12/01/2018    History of Present Illness Patient is 68 y.o. female s/p Rt TKA on 11/30/18 with PMH significant for WPW syndrome, HTN, HLD, GERD, fibromyalgia, DM, COPD, and prior Lt TKA in 2005.    PT Comments    Per chart and RN, pt was hypotensive after coming out of the bathroom. Pt was given a bolus. Ambulated a 2nd time today without complaints. BP 108/51 once back in room-made RN aware. Plan is still for d/c home today. Pt is waiting for her DME.     Follow Up Recommendations  Follow surgeon's recommendation for DC plan and follow-up therapies     Equipment Recommendations  Rolling walker with 5" wheels(youth height)    Recommendations for Other Services       Precautions / Restrictions Precautions Precautions: Fall Restrictions Weight Bearing Restrictions: No Other Position/Activity Restrictions: WBAT    Mobility  Bed Mobility               General bed mobility comments: oob in recliner  Transfers Overall transfer level: Needs assistance Equipment used: Rolling walker (2 wheeled) Transfers: Sit to/from Stand Sit to Stand: Min guard         General transfer comment: for safety. VCs hand placement.  Ambulation/Gait Ambulation/Gait assistance: Min guard Gait Distance (Feet): 85 Feet Assistive device: Rolling walker (2 wheeled) Gait Pattern/deviations: Step-to pattern;Step-through pattern;Decreased stride length     General Gait Details: slow but steady gait speed. cues for safety, proper use/distance from RW.   Stairs Stairs: Yes Stairs assistance: Min guard Stair Management: Forwards;Step to pattern;With walker Number of Stairs: 1 General stair comments: VCs safety, sequence, technique. Close guard for safety.   Wheelchair Mobility    Modified Rankin (Stroke Patients Only)       Balance Overall balance assessment: Needs  assistance         Standing balance support: Bilateral upper extremity supported Standing balance-Leahy Scale: Poor                              Cognition Arousal/Alertness: Awake/alert Behavior During Therapy: WFL for tasks assessed/performed Overall Cognitive Status: Within Functional Limits for tasks assessed                                        Exercises   General Comments        Pertinent Vitals/Pain Pain Assessment: 0-10 Pain Score: 6  Pain Location: R knee Pain Descriptors / Indicators: Aching;Sore Pain Intervention(s): Monitored during session;Repositioned    Home Living                      Prior Function            PT Goals (current goals can now be found in the care plan section) Progress towards PT goals: Progressing toward goals    Frequency    7X/week      PT Plan Current plan remains appropriate    Co-evaluation              AM-PAC PT "6 Clicks" Mobility   Outcome Measure  Help needed turning from your back to your side while in a flat bed without using bedrails?: A Little Help needed moving from lying on your back to  sitting on the side of a flat bed without using bedrails?: A Little Help needed moving to and from a bed to a chair (including a wheelchair)?: A Little Help needed standing up from a chair using your arms (e.g., wheelchair or bedside chair)?: A Little Help needed to walk in hospital room?: A Little Help needed climbing 3-5 steps with a railing? : A Little 6 Click Score: 18    End of Session Equipment Utilized During Treatment: Gait belt Activity Tolerance: Patient tolerated treatment well Patient left: in chair;with call bell/phone within reach   PT Visit Diagnosis: Other abnormalities of gait and mobility (R26.89);Muscle weakness (generalized) (M62.81);Difficulty in walking, not elsewhere classified (R26.2);Unsteadiness on feet (R26.81)     Time: [id number] PT Time  Calculation (min) (ACUTE ONLY): 21 min  Charges:  $Gait Training: 8-22 mins $Therapeutic Exercise: 8-22 mins                        Weston Anna, PT Acute Rehabilitation Services Pager: [phone] Office: [phone]

## 2018-12-01 NOTE — Progress Notes (Signed)
Pt got up to go to bathroom with PT.  Pt complained of dizziness, nausea, and feeling hot/flushed.  Pt escorted back to chair.  CBG obtained showed 158, bp 87/55, then 91/55 after resting. Joanell Rising paged and made aware.  56ml bolus ordered.  Pt resting states she is feeling better.  RN will monitor.

## 2018-12-01 NOTE — Progress Notes (Signed)
PATIENT ID: Deanna Watson  MRN: [id number]  DOB/AGE:  05/13/1950 / 68 y.o.  1 Day Post-Op Procedure(s) (LRB): RIGHT TOTAL KNEE ARTHROPLASTY (Right)    PROGRESS NOTE Subjective: Patient is alert, oriented, no Nausea, no Vomiting, yes passing gas. Taking PO well. Denies SOB, Chest or Calf Pain. Using Incentive Spirometer, PAS in place. Ambulate 80', Patient reports pain as 2/10 .    Objective: Vital signs in last 24 hours: Vitals:   11/30/18 2226 11/30/18 2305 12/01/18 0221 12/01/18 0538  BP: 135/63  (Abnormal) 122/59 109/60  Pulse: 63  60 61  Resp: 18  18 17   Temp: 98.3 F (36.8 C)  98.1 F (36.7 C) 97.7 F (36.5 C)  TempSrc: Oral  Oral Oral  SpO2: 100% 98% 99% 100%  Weight:      Height:          Intake/Output from previous day: I/O last 3 completed shifts: In: 3504.9 [P.O.:820; I.V.:2584.9; IV Piggyback:100] Out: 2725 [Urine:2575; Blood:150]   Intake/Output this shift: No intake/output data recorded.   LABORATORY DATA: Recent Labs    11/30/18 1241 11/30/18 1731 11/30/18 1757 11/30/18 2228 12/01/18 0326  WBC  --   --   --   --  10.0  HGB  --   --   --   --  9.3*  HCT  --   --   --   --  29.6*  PLT  --   --   --   --  155  NA  --   --   --   --  135  K  --   --   --   --  4.4  CL  --   --   --   --  103  CO2  --   --   --   --  25  BUN  --   --   --   --  26*  CREATININE  --   --   --   --  0.82  GLUCOSE  --   --  413*  --  170*  GLUCAP 272* 404*  --  159*  --   CALCIUM  --   --   --   --  8.5*    Examination: Neurologically intact ABD soft Neurovascular intact Sensation intact distally Intact pulses distally Dorsiflexion/Plantar flexion intact Incision: dressing C/D/I No cellulitis present Compartment soft}  Assessment:   1 Day Post-Op Procedure(s) (LRB): RIGHT TOTAL KNEE ARTHROPLASTY (Right) ADDITIONAL DIAGNOSIS: Expected Acute Blood Loss Anemia, NIDDM, PVD  Patient's anticipated LOS is less than 2 midnights, meeting these requirements: -  Younger than 52 - Lives within 1 hour of care - Has a competent adult at home to recover with post-op recover - NO history of  - Chronic pain requiring opiods  - Diabetes  - Coronary Artery Disease  - Heart failure  - Heart attack  - Stroke  - DVT/VTE  - Cardiac arrhythmia  - Respiratory Failure/COPD  - Renal failure  - Anemia  - Advanced Liver disease       Plan: PT/OT WBAT, AROM and PROM  DVT Prophylaxis:  SCDx72hrs, ASA 81 mg BID x 2 weeks DISCHARGE PLAN: Home, today if passes PT DISCHARGE NEEDS: HHPT, Walker and 3-in-1 comode seat     Kerin Salen 12/01/2018, 7:11 AM Patient ID: Deanna Watson, female   DOB: Dec 25, 1950, 68 y.o.   MRN: [id number]

## 2018-12-01 NOTE — Progress Notes (Signed)
Physical Therapy Treatment Patient Details Name: Deanna Watson MRN: [id number] DOB: [birth year] Today's Date: 12/01/2018    History of Present Illness Patient is 68 y.o. female s/p Rt TKA on 11/30/18 with PMH significant for WPW syndrome, HTN, HLD, GERD, fibromyalgia, DM, COPD, and prior Lt TKA in 2005.    PT Comments    Progressing well. Reviewed/practiced exercises, gait training, and stair training. All education completed. Okay to d/c from PT standpoint.    Follow Up Recommendations  Follow surgeon's recommendation for DC plan and follow-up therapies     Equipment Recommendations  Rolling walker with 5" wheels(youth height)    Recommendations for Other Services       Precautions / Restrictions Precautions Precautions: Fall Restrictions Weight Bearing Restrictions: No Other Position/Activity Restrictions: WBAT    Mobility  Bed Mobility               General bed mobility comments: oob in recliner  Transfers Overall transfer level: Needs assistance Equipment used: Rolling walker (2 wheeled) Transfers: Sit to/from Stand Sit to Stand: Min guard         General transfer comment: for safety. VCs hand placement.  Ambulation/Gait Ambulation/Gait assistance: Min guard Gait Distance (Feet): 75 Feet Assistive device: Rolling walker (2 wheeled) Gait Pattern/deviations: Step-to pattern;Step-through pattern;Decreased stride length     General Gait Details: slow but steady gait speed. cues for safety, proper use/distance from RW.   Stairs Stairs: Yes Stairs assistance: Min guard Stair Management: Forwards;Step to pattern;With walker Number of Stairs: 1 General stair comments: VCs safety, sequence, technique. Close guard for safety.   Wheelchair Mobility    Modified Rankin (Stroke Patients Only)       Balance Overall balance assessment: Mild deficits observed, not formally tested                                          Cognition  Arousal/Alertness: Awake/alert Behavior During Therapy: WFL for tasks assessed/performed Overall Cognitive Status: Within Functional Limits for tasks assessed                                        Exercises Total Joint Exercises Ankle Circles/Pumps: AROM;Both;Seated;10 reps Quad Sets: AROM;10 reps;Supine;Right Heel Slides: AAROM;Right;10 reps;Seated Hip ABduction/ADduction: AROM;Right;10 reps;Seated Straight Leg Raises: AAROM;Right;10 reps;Seated Goniometric ROM: ~5-65 degrees    General Comments        Pertinent Vitals/Pain Pain Assessment: 0-10 Pain Score: 5  Pain Location: R knee Pain Descriptors / Indicators: Aching;Sore Pain Intervention(s): Monitored during session;Ice applied;Repositioned    Home Living                      Prior Function            PT Goals (current goals can now be found in the care plan section) Progress towards PT goals: Progressing toward goals    Frequency    7X/week      PT Plan Current plan remains appropriate    Co-evaluation              AM-PAC PT "6 Clicks" Mobility   Outcome Measure  Help needed turning from your back to your side while in a flat bed without using bedrails?: A Little Help needed moving from lying on your back to sitting on  the side of a flat bed without using bedrails?: A Little Help needed moving to and from a bed to a chair (including a wheelchair)?: A Little Help needed standing up from a chair using your arms (e.g., wheelchair or bedside chair)?: A Little Help needed to walk in hospital room?: A Little Help needed climbing 3-5 steps with a railing? : A Little 6 Click Score: 18    End of Session Equipment Utilized During Treatment: Gait belt Activity Tolerance: Patient tolerated treatment well Patient left: (in bathroom with instructions to pull call light when finished)   PT Visit Diagnosis: Other abnormalities of gait and mobility (R26.89);Muscle weakness  (generalized) (M62.81);Difficulty in walking, not elsewhere classified (R26.2);Unsteadiness on feet (R26.81)     Time: [id number] PT Time Calculation (min) (ACUTE ONLY): 24 min  Charges:  $Gait Training: 8-22 mins $Therapeutic Exercise: 8-22 mins                        Weston Anna, PT Acute Rehabilitation Services Pager: [phone] Office: [phone]

## 2018-12-02 ENCOUNTER — Telehealth: Payer: Self-pay | Admitting: *Deleted

## 2018-12-02 DIAGNOSIS — E1151 Type 2 diabetes mellitus with diabetic peripheral angiopathy without gangrene: Secondary | ICD-10-CM | POA: Diagnosis not present

## 2018-12-02 DIAGNOSIS — Z471 Aftercare following joint replacement surgery: Secondary | ICD-10-CM | POA: Diagnosis not present

## 2018-12-02 DIAGNOSIS — E1140 Type 2 diabetes mellitus with diabetic neuropathy, unspecified: Secondary | ICD-10-CM | POA: Diagnosis not present

## 2018-12-02 DIAGNOSIS — E782 Mixed hyperlipidemia: Secondary | ICD-10-CM | POA: Diagnosis not present

## 2018-12-02 DIAGNOSIS — Z96651 Presence of right artificial knee joint: Secondary | ICD-10-CM | POA: Diagnosis not present

## 2018-12-02 DIAGNOSIS — I10 Essential (primary) hypertension: Secondary | ICD-10-CM | POA: Diagnosis not present

## 2018-12-02 DIAGNOSIS — F330 Major depressive disorder, recurrent, mild: Secondary | ICD-10-CM | POA: Diagnosis not present

## 2018-12-02 DIAGNOSIS — I456 Pre-excitation syndrome: Secondary | ICD-10-CM | POA: Diagnosis not present

## 2018-12-02 DIAGNOSIS — E11319 Type 2 diabetes mellitus with unspecified diabetic retinopathy without macular edema: Secondary | ICD-10-CM | POA: Diagnosis not present

## 2018-12-02 NOTE — Telephone Encounter (Signed)
Pt was on TCM he was admitted 11/30/2018 for operative treatment of Osteoarthritis of right knee. Pt underwent a RIGHT TOTAL KNEE ARTHROPLASTY, and tolerated procedure well. Pt D/C 12/01/18, and will follow-up w/ surgeon  Frederik Pear, MD In 2 weeks.Marland KitchenJohny Chess

## 2018-12-03 DIAGNOSIS — E782 Mixed hyperlipidemia: Secondary | ICD-10-CM | POA: Diagnosis not present

## 2018-12-03 DIAGNOSIS — E1151 Type 2 diabetes mellitus with diabetic peripheral angiopathy without gangrene: Secondary | ICD-10-CM | POA: Diagnosis not present

## 2018-12-03 DIAGNOSIS — I456 Pre-excitation syndrome: Secondary | ICD-10-CM | POA: Diagnosis not present

## 2018-12-03 DIAGNOSIS — Z96651 Presence of right artificial knee joint: Secondary | ICD-10-CM | POA: Diagnosis not present

## 2018-12-03 DIAGNOSIS — I10 Essential (primary) hypertension: Secondary | ICD-10-CM | POA: Diagnosis not present

## 2018-12-03 DIAGNOSIS — Z471 Aftercare following joint replacement surgery: Secondary | ICD-10-CM | POA: Diagnosis not present

## 2018-12-03 DIAGNOSIS — E1140 Type 2 diabetes mellitus with diabetic neuropathy, unspecified: Secondary | ICD-10-CM | POA: Diagnosis not present

## 2018-12-03 DIAGNOSIS — E11319 Type 2 diabetes mellitus with unspecified diabetic retinopathy without macular edema: Secondary | ICD-10-CM | POA: Diagnosis not present

## 2018-12-03 DIAGNOSIS — F330 Major depressive disorder, recurrent, mild: Secondary | ICD-10-CM | POA: Diagnosis not present

## 2018-12-04 DIAGNOSIS — I10 Essential (primary) hypertension: Secondary | ICD-10-CM | POA: Diagnosis not present

## 2018-12-04 DIAGNOSIS — Z96651 Presence of right artificial knee joint: Secondary | ICD-10-CM | POA: Diagnosis not present

## 2018-12-04 DIAGNOSIS — E11319 Type 2 diabetes mellitus with unspecified diabetic retinopathy without macular edema: Secondary | ICD-10-CM | POA: Diagnosis not present

## 2018-12-04 DIAGNOSIS — E782 Mixed hyperlipidemia: Secondary | ICD-10-CM | POA: Diagnosis not present

## 2018-12-04 DIAGNOSIS — Z471 Aftercare following joint replacement surgery: Secondary | ICD-10-CM | POA: Diagnosis not present

## 2018-12-04 DIAGNOSIS — I456 Pre-excitation syndrome: Secondary | ICD-10-CM | POA: Diagnosis not present

## 2018-12-04 DIAGNOSIS — F330 Major depressive disorder, recurrent, mild: Secondary | ICD-10-CM | POA: Diagnosis not present

## 2018-12-04 DIAGNOSIS — E1140 Type 2 diabetes mellitus with diabetic neuropathy, unspecified: Secondary | ICD-10-CM | POA: Diagnosis not present

## 2018-12-04 DIAGNOSIS — E1151 Type 2 diabetes mellitus with diabetic peripheral angiopathy without gangrene: Secondary | ICD-10-CM | POA: Diagnosis not present

## 2018-12-07 DIAGNOSIS — I10 Essential (primary) hypertension: Secondary | ICD-10-CM | POA: Diagnosis not present

## 2018-12-07 DIAGNOSIS — E782 Mixed hyperlipidemia: Secondary | ICD-10-CM | POA: Diagnosis not present

## 2018-12-07 DIAGNOSIS — Z96651 Presence of right artificial knee joint: Secondary | ICD-10-CM | POA: Diagnosis not present

## 2018-12-07 DIAGNOSIS — E1140 Type 2 diabetes mellitus with diabetic neuropathy, unspecified: Secondary | ICD-10-CM | POA: Diagnosis not present

## 2018-12-07 DIAGNOSIS — E11319 Type 2 diabetes mellitus with unspecified diabetic retinopathy without macular edema: Secondary | ICD-10-CM | POA: Diagnosis not present

## 2018-12-07 DIAGNOSIS — Z471 Aftercare following joint replacement surgery: Secondary | ICD-10-CM | POA: Diagnosis not present

## 2018-12-07 DIAGNOSIS — F330 Major depressive disorder, recurrent, mild: Secondary | ICD-10-CM | POA: Diagnosis not present

## 2018-12-07 DIAGNOSIS — E1151 Type 2 diabetes mellitus with diabetic peripheral angiopathy without gangrene: Secondary | ICD-10-CM | POA: Diagnosis not present

## 2018-12-07 DIAGNOSIS — I456 Pre-excitation syndrome: Secondary | ICD-10-CM | POA: Diagnosis not present

## 2018-12-09 DIAGNOSIS — E11319 Type 2 diabetes mellitus with unspecified diabetic retinopathy without macular edema: Secondary | ICD-10-CM | POA: Diagnosis not present

## 2018-12-09 DIAGNOSIS — E1151 Type 2 diabetes mellitus with diabetic peripheral angiopathy without gangrene: Secondary | ICD-10-CM | POA: Diagnosis not present

## 2018-12-09 DIAGNOSIS — I10 Essential (primary) hypertension: Secondary | ICD-10-CM | POA: Diagnosis not present

## 2018-12-09 DIAGNOSIS — E782 Mixed hyperlipidemia: Secondary | ICD-10-CM | POA: Diagnosis not present

## 2018-12-09 DIAGNOSIS — Z471 Aftercare following joint replacement surgery: Secondary | ICD-10-CM | POA: Diagnosis not present

## 2018-12-09 DIAGNOSIS — F330 Major depressive disorder, recurrent, mild: Secondary | ICD-10-CM | POA: Diagnosis not present

## 2018-12-09 DIAGNOSIS — I456 Pre-excitation syndrome: Secondary | ICD-10-CM | POA: Diagnosis not present

## 2018-12-09 DIAGNOSIS — E1140 Type 2 diabetes mellitus with diabetic neuropathy, unspecified: Secondary | ICD-10-CM | POA: Diagnosis not present

## 2018-12-09 DIAGNOSIS — Z96651 Presence of right artificial knee joint: Secondary | ICD-10-CM | POA: Diagnosis not present

## 2018-12-11 DIAGNOSIS — I10 Essential (primary) hypertension: Secondary | ICD-10-CM | POA: Diagnosis not present

## 2018-12-11 DIAGNOSIS — E782 Mixed hyperlipidemia: Secondary | ICD-10-CM | POA: Diagnosis not present

## 2018-12-11 DIAGNOSIS — I456 Pre-excitation syndrome: Secondary | ICD-10-CM | POA: Diagnosis not present

## 2018-12-11 DIAGNOSIS — E1140 Type 2 diabetes mellitus with diabetic neuropathy, unspecified: Secondary | ICD-10-CM | POA: Diagnosis not present

## 2018-12-11 DIAGNOSIS — E11319 Type 2 diabetes mellitus with unspecified diabetic retinopathy without macular edema: Secondary | ICD-10-CM | POA: Diagnosis not present

## 2018-12-11 DIAGNOSIS — F330 Major depressive disorder, recurrent, mild: Secondary | ICD-10-CM | POA: Diagnosis not present

## 2018-12-11 DIAGNOSIS — E1151 Type 2 diabetes mellitus with diabetic peripheral angiopathy without gangrene: Secondary | ICD-10-CM | POA: Diagnosis not present

## 2018-12-11 DIAGNOSIS — Z471 Aftercare following joint replacement surgery: Secondary | ICD-10-CM | POA: Diagnosis not present

## 2018-12-11 DIAGNOSIS — Z96651 Presence of right artificial knee joint: Secondary | ICD-10-CM | POA: Diagnosis not present

## 2018-12-14 DIAGNOSIS — F330 Major depressive disorder, recurrent, mild: Secondary | ICD-10-CM | POA: Diagnosis not present

## 2018-12-14 DIAGNOSIS — I10 Essential (primary) hypertension: Secondary | ICD-10-CM | POA: Diagnosis not present

## 2018-12-14 DIAGNOSIS — I456 Pre-excitation syndrome: Secondary | ICD-10-CM | POA: Diagnosis not present

## 2018-12-14 DIAGNOSIS — E11319 Type 2 diabetes mellitus with unspecified diabetic retinopathy without macular edema: Secondary | ICD-10-CM | POA: Diagnosis not present

## 2018-12-14 DIAGNOSIS — Z96651 Presence of right artificial knee joint: Secondary | ICD-10-CM | POA: Diagnosis not present

## 2018-12-14 DIAGNOSIS — E1151 Type 2 diabetes mellitus with diabetic peripheral angiopathy without gangrene: Secondary | ICD-10-CM | POA: Diagnosis not present

## 2018-12-14 DIAGNOSIS — E782 Mixed hyperlipidemia: Secondary | ICD-10-CM | POA: Diagnosis not present

## 2018-12-14 DIAGNOSIS — Z471 Aftercare following joint replacement surgery: Secondary | ICD-10-CM | POA: Diagnosis not present

## 2018-12-14 DIAGNOSIS — E1140 Type 2 diabetes mellitus with diabetic neuropathy, unspecified: Secondary | ICD-10-CM | POA: Diagnosis not present

## 2018-12-15 DIAGNOSIS — Z96651 Presence of right artificial knee joint: Secondary | ICD-10-CM | POA: Diagnosis not present

## 2018-12-15 DIAGNOSIS — M1711 Unilateral primary osteoarthritis, right knee: Secondary | ICD-10-CM | POA: Diagnosis not present

## 2018-12-15 DIAGNOSIS — Z471 Aftercare following joint replacement surgery: Secondary | ICD-10-CM | POA: Diagnosis not present

## 2018-12-22 DIAGNOSIS — F330 Major depressive disorder, recurrent, mild: Secondary | ICD-10-CM | POA: Diagnosis not present

## 2018-12-22 DIAGNOSIS — E782 Mixed hyperlipidemia: Secondary | ICD-10-CM | POA: Diagnosis not present

## 2018-12-22 DIAGNOSIS — I10 Essential (primary) hypertension: Secondary | ICD-10-CM | POA: Diagnosis not present

## 2018-12-22 DIAGNOSIS — E1140 Type 2 diabetes mellitus with diabetic neuropathy, unspecified: Secondary | ICD-10-CM | POA: Diagnosis not present

## 2018-12-22 DIAGNOSIS — E1151 Type 2 diabetes mellitus with diabetic peripheral angiopathy without gangrene: Secondary | ICD-10-CM | POA: Diagnosis not present

## 2018-12-22 DIAGNOSIS — Z471 Aftercare following joint replacement surgery: Secondary | ICD-10-CM | POA: Diagnosis not present

## 2018-12-22 DIAGNOSIS — E11319 Type 2 diabetes mellitus with unspecified diabetic retinopathy without macular edema: Secondary | ICD-10-CM | POA: Diagnosis not present

## 2018-12-22 DIAGNOSIS — Z96651 Presence of right artificial knee joint: Secondary | ICD-10-CM | POA: Diagnosis not present

## 2018-12-22 DIAGNOSIS — I456 Pre-excitation syndrome: Secondary | ICD-10-CM | POA: Diagnosis not present

## 2018-12-23 DIAGNOSIS — E1151 Type 2 diabetes mellitus with diabetic peripheral angiopathy without gangrene: Secondary | ICD-10-CM | POA: Diagnosis not present

## 2018-12-23 DIAGNOSIS — Z96651 Presence of right artificial knee joint: Secondary | ICD-10-CM | POA: Diagnosis not present

## 2018-12-23 DIAGNOSIS — I10 Essential (primary) hypertension: Secondary | ICD-10-CM | POA: Diagnosis not present

## 2018-12-23 DIAGNOSIS — E11319 Type 2 diabetes mellitus with unspecified diabetic retinopathy without macular edema: Secondary | ICD-10-CM | POA: Diagnosis not present

## 2018-12-23 DIAGNOSIS — E1140 Type 2 diabetes mellitus with diabetic neuropathy, unspecified: Secondary | ICD-10-CM | POA: Diagnosis not present

## 2018-12-23 DIAGNOSIS — I456 Pre-excitation syndrome: Secondary | ICD-10-CM | POA: Diagnosis not present

## 2018-12-23 DIAGNOSIS — E782 Mixed hyperlipidemia: Secondary | ICD-10-CM | POA: Diagnosis not present

## 2018-12-23 DIAGNOSIS — F330 Major depressive disorder, recurrent, mild: Secondary | ICD-10-CM | POA: Diagnosis not present

## 2018-12-23 DIAGNOSIS — Z471 Aftercare following joint replacement surgery: Secondary | ICD-10-CM | POA: Diagnosis not present

## 2018-12-29 DIAGNOSIS — M79604 Pain in right leg: Secondary | ICD-10-CM | POA: Diagnosis not present

## 2018-12-30 DIAGNOSIS — M79604 Pain in right leg: Secondary | ICD-10-CM | POA: Diagnosis not present

## 2018-12-30 DIAGNOSIS — M25461 Effusion, right knee: Secondary | ICD-10-CM | POA: Diagnosis not present

## 2018-12-30 DIAGNOSIS — Z5320 Procedure and treatment not carried out because of patient's decision for unspecified reasons: Secondary | ICD-10-CM | POA: Diagnosis not present

## 2018-12-30 DIAGNOSIS — Z96651 Presence of right artificial knee joint: Secondary | ICD-10-CM | POA: Diagnosis not present

## 2019-01-15 ENCOUNTER — Telehealth: Payer: Self-pay | Admitting: Internal Medicine

## 2019-01-15 NOTE — Telephone Encounter (Signed)
rx refill escitalopram (LEXAPRO) 20 MG tablet  [facility] [address], [place] [phone] (Phone) [phone] (Fax)

## 2019-01-18 ENCOUNTER — Other Ambulatory Visit: Payer: Self-pay | Admitting: Internal Medicine

## 2019-01-18 DIAGNOSIS — F330 Major depressive disorder, recurrent, mild: Secondary | ICD-10-CM

## 2019-01-18 MED ORDER — ESCITALOPRAM OXALATE 20 MG PO TABS: 20.0000 mg | ORAL_TABLET | Freq: Every day | ORAL | 1 refills | Status: AC

## 2019-01-18 NOTE — Telephone Encounter (Signed)
RX sent  TJ 

## 2019-01-19 DIAGNOSIS — W540XXA Bitten by dog, initial encounter: Secondary | ICD-10-CM | POA: Diagnosis not present

## 2019-01-19 DIAGNOSIS — S91155A Open bite of left lesser toe(s) without damage to nail, initial encounter: Secondary | ICD-10-CM | POA: Diagnosis not present

## 2019-01-19 DIAGNOSIS — D1632 Benign neoplasm of short bones of left lower limb: Secondary | ICD-10-CM | POA: Diagnosis not present

## 2019-01-19 DIAGNOSIS — D1631 Benign neoplasm of short bones of right lower limb: Secondary | ICD-10-CM | POA: Diagnosis not present

## 2019-02-02 DIAGNOSIS — S91105D Unspecified open wound of left lesser toe(s) without damage to nail, subsequent encounter: Secondary | ICD-10-CM | POA: Diagnosis not present

## 2019-03-16 DIAGNOSIS — W540XXD Bitten by dog, subsequent encounter: Secondary | ICD-10-CM | POA: Diagnosis not present

## 2019-03-16 DIAGNOSIS — S91302D Unspecified open wound, left foot, subsequent encounter: Secondary | ICD-10-CM | POA: Diagnosis not present

## 2019-03-16 DIAGNOSIS — D1632 Benign neoplasm of short bones of left lower limb: Secondary | ICD-10-CM | POA: Diagnosis not present

## 2019-03-16 DIAGNOSIS — D1631 Benign neoplasm of short bones of right lower limb: Secondary | ICD-10-CM | POA: Diagnosis not present

## 2019-04-05 IMAGING — CT CT ANGIO AOBIFEM WO/W CM
1 series · 14 of 32 positions shown, 17 images · IV contrast (APPLIED)
Comparison: CT 09/19/2016

CLINICAL DATA: AAA without rupture former smoker Right popliteal
aneurysm

EXAM:
CT ANGIOGRAPHY OF ABDOMINAL AORTA WITH ILIOFEMORAL RUNOFF
TECHNIQUE: Multidetector CT imaging of the abdomen, pelvis and lower
extremities was performed using the standard protocol during bolus
administration of intravenous contrast. Multiplanar CT image
reconstructions and MIPs were obtained to evaluate the vascular
anatomy.
CONTRAST:  125mL LOPN09-L5Q IOPAMIDOL (LOPN09-L5Q) INJECTION 76%

[Series 4: angiorunoff 3.0 b31s · axial · 0.98mm/px · z∈[-1147,-100]mm · 14 of 387 slices shown, 17 images]
[im 25/387  soft-tissue]
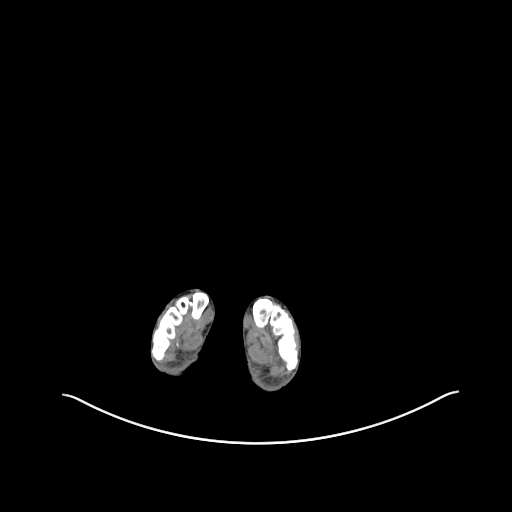
[im 25/387  bone]
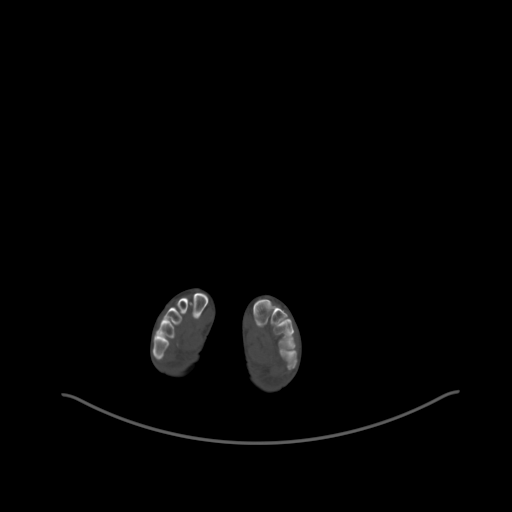
[im 63/387  soft-tissue]
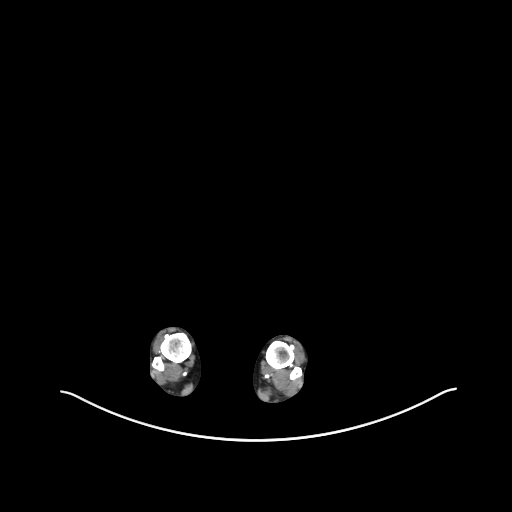
[im 88/387  soft-tissue]
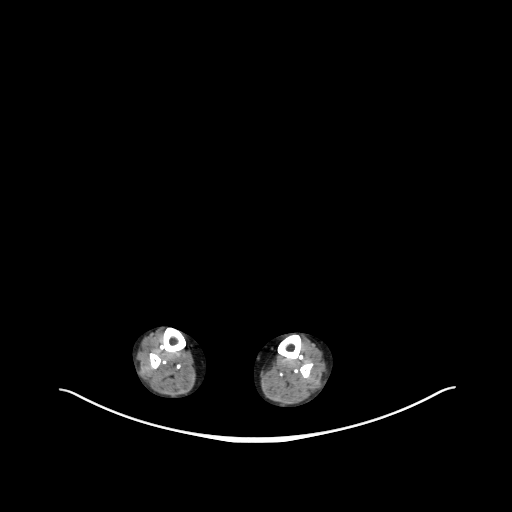
[im 125/387  soft-tissue]
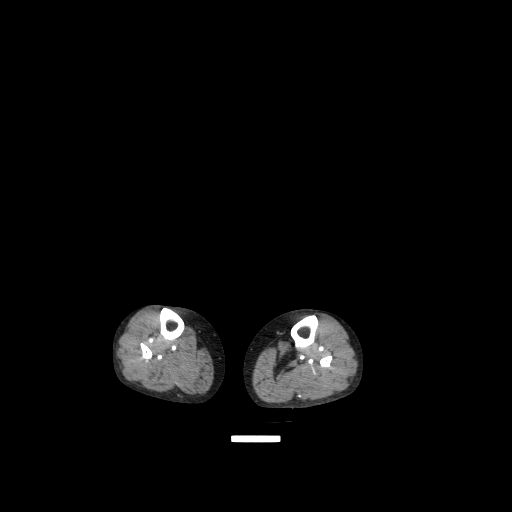
[im 162/387  soft-tissue]
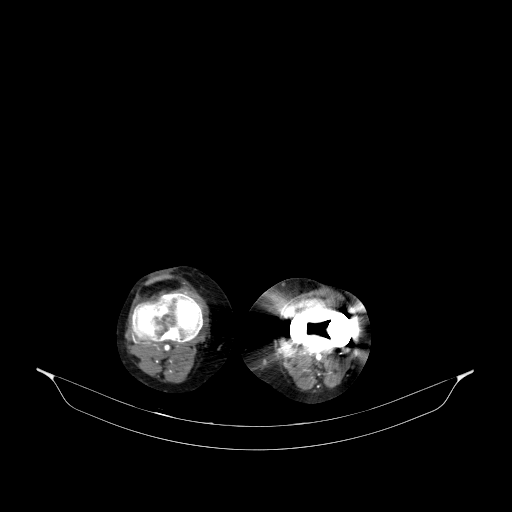
[im 200/387  soft-tissue]
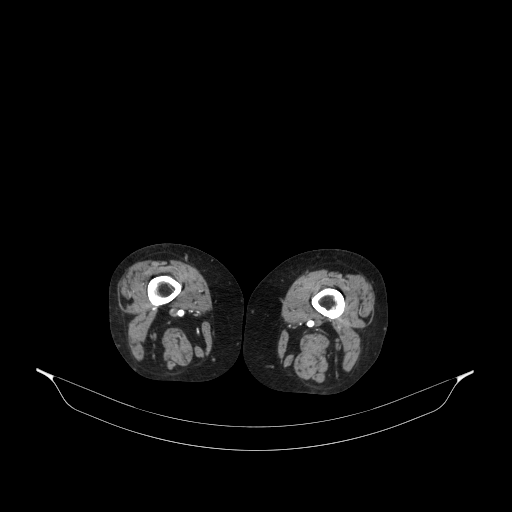
[im 225/387  soft-tissue]
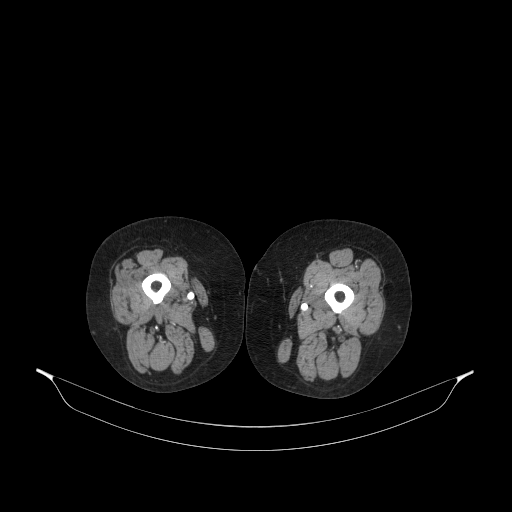
[im 262/387  soft-tissue]
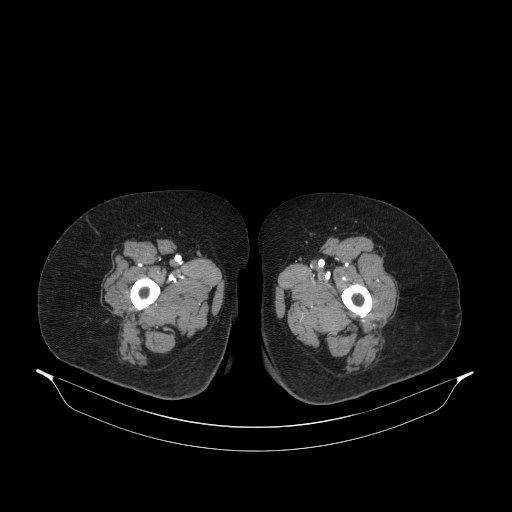
[im 299/387  soft-tissue]
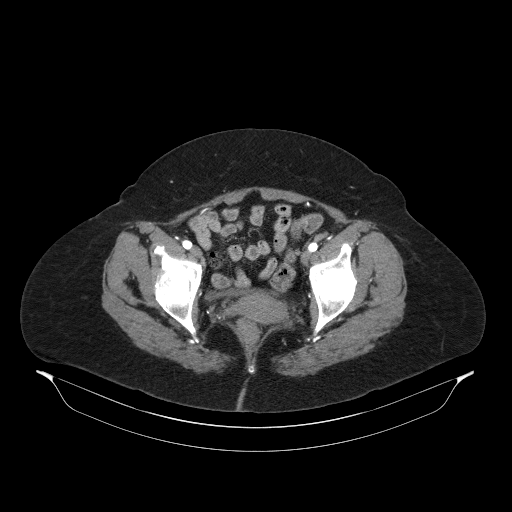
[im 299/387  bone]
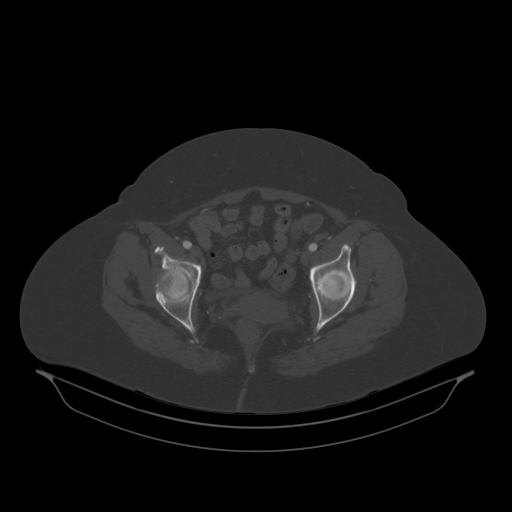
[im 324/387  soft-tissue]
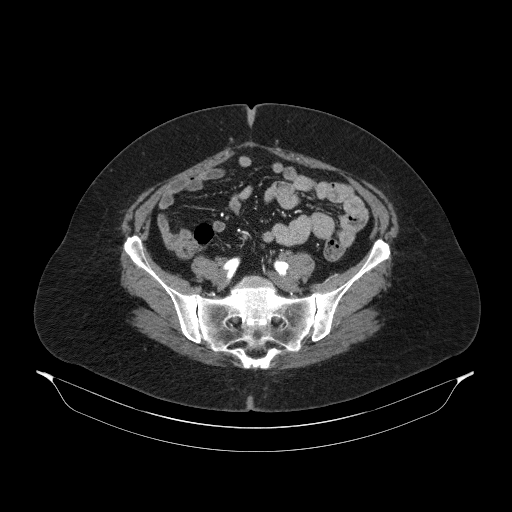
[im 337/387  lung]
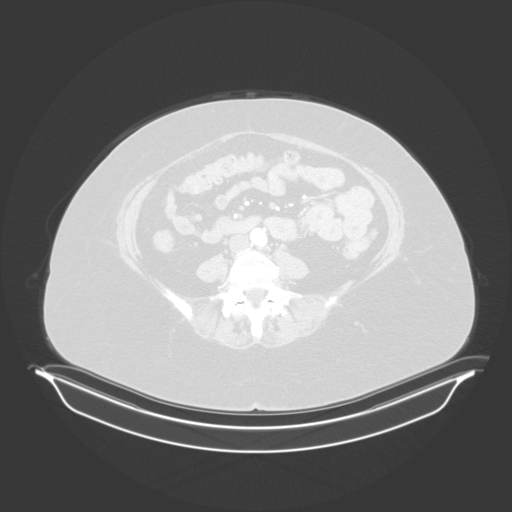
[im 349/387  lung]
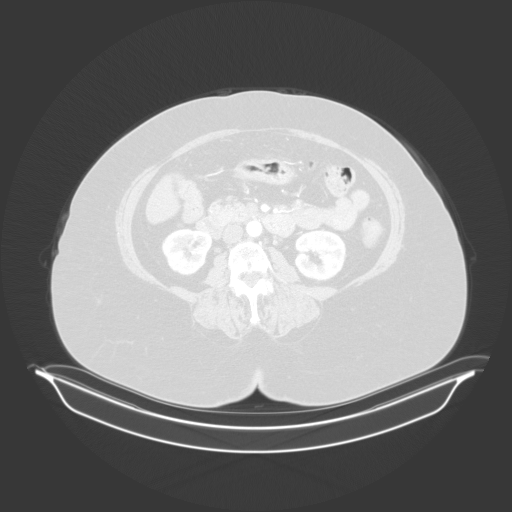
[im 362/387  soft-tissue]
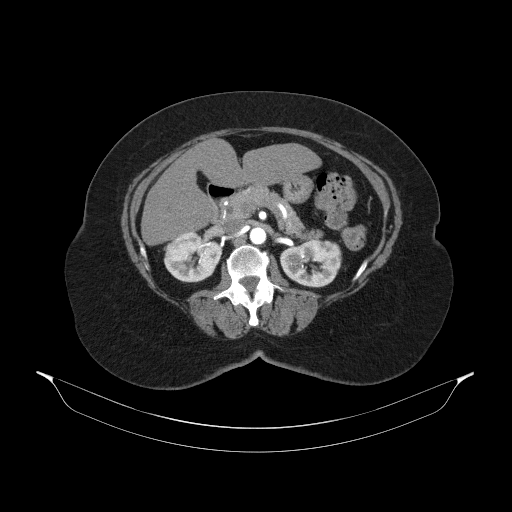
[im 362/387  lung]
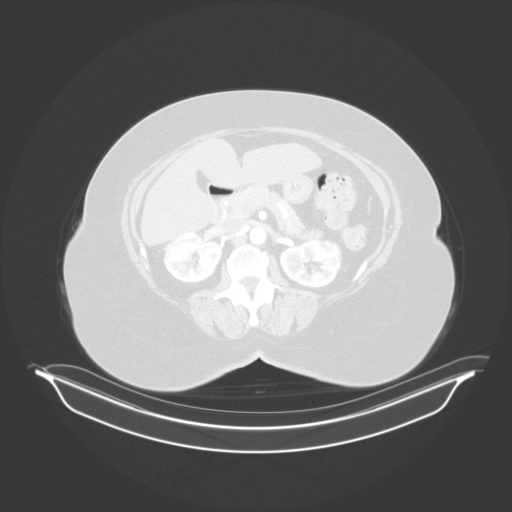
[im 374/387  lung]
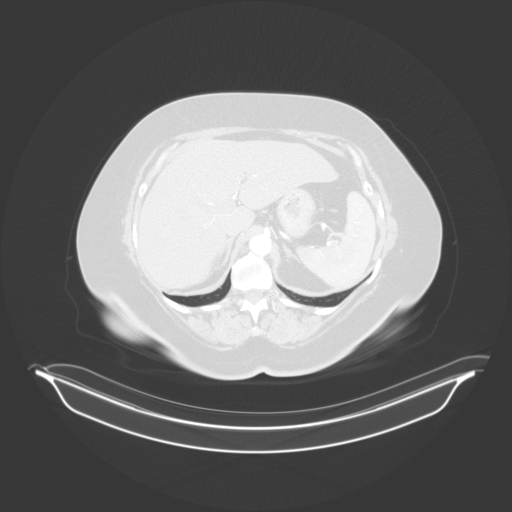

[14 of 32 positions shown; findings below may reference images not displayed]

FINDINGS: VASCULAR

Aorta: Minimal calcified atheromatous plaque in the infrarenal
segment. No aneurysm, dissection, or stenosis.

Celiac: Minimal calcified nonocclusive plaque at the ostium. Patent
distally.

SMA: Patent without evidence of aneurysm, dissection, vasculitis or
significant stenosis.

Renals: Both renal arteries are patent without evidence of aneurysm,
dissection, vasculitis, fibromuscular dysplasia or significant
stenosis.

IMA: Patent without evidence of aneurysm, dissection, vasculitis or
significant stenosis.

RIGHT Lower Extremity

Inflow: Minimal calcified plaque in the internal iliac artery.
Common and external iliac arteries widely patent. Minimal eccentric
plaque in the common femoral artery.

Outflow: Deep femoral branches patent. Mild atheromatous
irregularity of the SFA without significant stenosis. Moderate
atheromatous plaque and calcification throughout the popliteal
artery without high-grade stenosis or aneurysm.

Runoff: Extensive medial calcification degrades evaluation.
Trifurcation runoff vessels are seen across the ankle.

LEFT Lower Extremity

Inflow: Minimal calcified plaque in the internal iliac artery.
Common and external iliac arteries unremarkable. Minimal plaque in
the common femoral artery.

Outflow: Deep femoral branches patent. Atheromatous irregularity of
the SFA without high-grade stenosis. Proximal popliteal artery has
only mild atheromatous calcification. There is significant streak
artifact from knee arthroplasty components limiting evaluation of
the mid popliteal artery. The distal popliteal artery has medial
calcifications but no definite high-grade stenosis.

Runoff: Extensive medial calcification degrades luminal evaluation.
Trifurcation runoff vessels are seen across the ankle.

Veins: No obvious venous abnormality within the limitations of this
arterial phase study.

Review of the MIP images confirms the above findings.

NON-VASCULAR

Lower chest: No acute abnormality.

Hepatobiliary: No focal liver abnormality is seen. Status post
cholecystectomy. No biliary dilatation.

Pancreas: Unremarkable. No pancreatic ductal dilatation or
surrounding inflammatory changes.

Spleen: Normal in size without focal abnormality.

Adrenals/Urinary Tract: Adrenal glands are unremarkable. Kidneys are
normal, without renal calculi, focal lesion, or hydronephrosis.
Bladder is unremarkable.

Stomach/Bowel: Stomach and small bowel are nondistended. Normal
appendix. The colon is nondilated, unremarkable.

Lymphatic: No abdominal or pelvic adenopathy.

Reproductive: Uterus and bilateral adnexa are unremarkable.

Other: No ascites.  No free air.

Musculoskeletal: Facet DJD in the lower lumbar spine. Degenerative
disc disease L5-S1. Negative for fracture or worrisome bone lesion.
3 components of left knee arthroplasty project in expected location.
IMPRESSION: VASCULAR

1.  Aortic Atherosclerosis ([id number]-170.0) without aneurysm.
2. Extensive medial calcifications in bilateral popliteal arteries
and trifurcation runoff vessels.
There is no right popliteal arterial aneurysm.
Visualized portions of the left popliteal artery unremarkable, a 4
cm segment obscured by streak artifact from knee arthroplasty
hardware.

NON-VASCULAR

1. No acute findings.

## 2019-04-06 DIAGNOSIS — G5762 Lesion of plantar nerve, left lower limb: Secondary | ICD-10-CM | POA: Diagnosis not present

## 2019-05-12 ENCOUNTER — Ambulatory Visit: Payer: Medicare HMO | Admitting: Internal Medicine

## 2019-05-18 DIAGNOSIS — G3184 Mild cognitive impairment, so stated: Secondary | ICD-10-CM | POA: Diagnosis not present

## 2019-05-18 DIAGNOSIS — I700 Atherosclerosis of aorta: Secondary | ICD-10-CM | POA: Diagnosis not present

## 2019-05-18 DIAGNOSIS — E559 Vitamin D deficiency, unspecified: Secondary | ICD-10-CM | POA: Diagnosis not present

## 2019-05-18 DIAGNOSIS — Z1321 Encounter for screening for nutritional disorder: Secondary | ICD-10-CM | POA: Diagnosis not present

## 2019-05-18 DIAGNOSIS — E1140 Type 2 diabetes mellitus with diabetic neuropathy, unspecified: Secondary | ICD-10-CM | POA: Diagnosis not present

## 2019-05-18 DIAGNOSIS — I456 Pre-excitation syndrome: Secondary | ICD-10-CM | POA: Diagnosis not present

## 2019-05-18 DIAGNOSIS — I10 Essential (primary) hypertension: Secondary | ICD-10-CM | POA: Diagnosis not present

## 2019-05-18 DIAGNOSIS — R079 Chest pain, unspecified: Secondary | ICD-10-CM | POA: Diagnosis not present

## 2019-05-18 DIAGNOSIS — E11319 Type 2 diabetes mellitus with unspecified diabetic retinopathy without macular edema: Secondary | ICD-10-CM | POA: Diagnosis not present

## 2019-05-18 DIAGNOSIS — E785 Hyperlipidemia, unspecified: Secondary | ICD-10-CM | POA: Diagnosis not present

## 2019-05-18 DIAGNOSIS — R6889 Other general symptoms and signs: Secondary | ICD-10-CM | POA: Diagnosis not present

## 2019-05-18 DIAGNOSIS — E669 Obesity, unspecified: Secondary | ICD-10-CM | POA: Diagnosis not present

## 2019-05-18 DIAGNOSIS — F330 Major depressive disorder, recurrent, mild: Secondary | ICD-10-CM | POA: Diagnosis not present

## 2019-05-18 DIAGNOSIS — I739 Peripheral vascular disease, unspecified: Secondary | ICD-10-CM | POA: Diagnosis not present

## 2019-05-18 DIAGNOSIS — I724 Aneurysm of artery of lower extremity: Secondary | ICD-10-CM | POA: Diagnosis not present

## 2019-05-31 ENCOUNTER — Other Ambulatory Visit: Payer: Self-pay | Admitting: Internal Medicine

## 2019-06-09 ENCOUNTER — Telehealth: Payer: Self-pay

## 2019-06-09 NOTE — Telephone Encounter (Signed)
New message   The patient wants to know when was her last bone density test

## 2019-06-10 NOTE — Telephone Encounter (Signed)
Last bone density was 2018

## 2019-06-10 NOTE — Telephone Encounter (Signed)
Pt contacted and informed of same.  

## 2019-06-16 ENCOUNTER — Telehealth: Payer: Self-pay | Admitting: Internal Medicine

## 2019-06-16 NOTE — Progress Notes (Signed)
Patient stated she was changing to another PCP at dr office in [facility] where she resides.    This note is not being shared with the patient for the following reason: To respect privacy (The patient or proxy has requested that the information not be shared).     Deanna Watson [employer] Scheduler

## 2020-03-29 ENCOUNTER — Telehealth: Payer: Self-pay | Admitting: Internal Medicine

## 2020-03-29 NOTE — Telephone Encounter (Signed)
   Left message to call for CPE/AWV

## 2020-11-29 ENCOUNTER — Ambulatory Visit: Payer: Medicare HMO | Admitting: Medical-Surgical

## 2020-12-10 ENCOUNTER — Other Ambulatory Visit: Payer: Self-pay

## 2020-12-10 ENCOUNTER — Inpatient Hospital Stay (HOSPITAL_COMMUNITY)
Admission: EM | Admit: 2020-12-10 | Discharge: 2020-12-12 | DRG: 917 | Disposition: A | Discharge status: Home / Self Care | Payer: Medicare HMO | Attending: Internal Medicine | Admitting: Internal Medicine

## 2020-12-10 DIAGNOSIS — E782 Mixed hyperlipidemia: Secondary | ICD-10-CM | POA: Diagnosis present

## 2020-12-10 DIAGNOSIS — K760 Fatty (change of) liver, not elsewhere classified: Secondary | ICD-10-CM | POA: Diagnosis present

## 2020-12-10 DIAGNOSIS — F411 Generalized anxiety disorder: Secondary | ICD-10-CM | POA: Diagnosis present

## 2020-12-10 DIAGNOSIS — R0902 Hypoxemia: Secondary | ICD-10-CM

## 2020-12-10 DIAGNOSIS — J449 Chronic obstructive pulmonary disease, unspecified: Secondary | ICD-10-CM | POA: Diagnosis present

## 2020-12-10 DIAGNOSIS — E1142 Type 2 diabetes mellitus with diabetic polyneuropathy: Secondary | ICD-10-CM | POA: Diagnosis present

## 2020-12-10 DIAGNOSIS — I456 Pre-excitation syndrome: Secondary | ICD-10-CM | POA: Diagnosis present

## 2020-12-10 DIAGNOSIS — Z6832 Body mass index (BMI) 32.0-32.9, adult: Secondary | ICD-10-CM | POA: Diagnosis not present

## 2020-12-10 DIAGNOSIS — Z818 Family history of other mental and behavioral disorders: Secondary | ICD-10-CM | POA: Diagnosis not present

## 2020-12-10 DIAGNOSIS — M1990 Unspecified osteoarthritis, unspecified site: Secondary | ICD-10-CM | POA: Diagnosis present

## 2020-12-10 DIAGNOSIS — Z811 Family history of alcohol abuse and dependence: Secondary | ICD-10-CM

## 2020-12-10 DIAGNOSIS — Z823 Family history of stroke: Secondary | ICD-10-CM

## 2020-12-10 DIAGNOSIS — E6609 Other obesity due to excess calories: Secondary | ICD-10-CM

## 2020-12-10 DIAGNOSIS — Z20822 Contact with and (suspected) exposure to covid-19: Secondary | ICD-10-CM | POA: Diagnosis present

## 2020-12-10 DIAGNOSIS — Z8041 Family history of malignant neoplasm of ovary: Secondary | ICD-10-CM

## 2020-12-10 DIAGNOSIS — Z803 Family history of malignant neoplasm of breast: Secondary | ICD-10-CM

## 2020-12-10 DIAGNOSIS — Z8249 Family history of ischemic heart disease and other diseases of the circulatory system: Secondary | ICD-10-CM

## 2020-12-10 DIAGNOSIS — K219 Gastro-esophageal reflux disease without esophagitis: Secondary | ICD-10-CM | POA: Diagnosis present

## 2020-12-10 DIAGNOSIS — T50912A Poisoning by multiple unspecified drugs, medicaments and biological substances, intentional self-harm, initial encounter: Secondary | ICD-10-CM | POA: Diagnosis not present

## 2020-12-10 DIAGNOSIS — E119 Type 2 diabetes mellitus without complications: Secondary | ICD-10-CM

## 2020-12-10 DIAGNOSIS — I10 Essential (primary) hypertension: Secondary | ICD-10-CM | POA: Diagnosis present

## 2020-12-10 DIAGNOSIS — Z800 Family history of malignant neoplasm of digestive organs: Secondary | ICD-10-CM

## 2020-12-10 DIAGNOSIS — M797 Fibromyalgia: Secondary | ICD-10-CM | POA: Diagnosis present

## 2020-12-10 DIAGNOSIS — Z833 Family history of diabetes mellitus: Secondary | ICD-10-CM

## 2020-12-10 DIAGNOSIS — F332 Major depressive disorder, recurrent severe without psychotic features: Secondary | ICD-10-CM

## 2020-12-10 DIAGNOSIS — Z6831 Body mass index (BMI) 31.0-31.9, adult: Secondary | ICD-10-CM | POA: Diagnosis not present

## 2020-12-10 DIAGNOSIS — T50902A Poisoning by unspecified drugs, medicaments and biological substances, intentional self-harm, initial encounter: Secondary | ICD-10-CM

## 2020-12-10 DIAGNOSIS — J9601 Acute respiratory failure with hypoxia: Secondary | ICD-10-CM | POA: Diagnosis present

## 2020-12-10 DIAGNOSIS — Z96653 Presence of artificial knee joint, bilateral: Secondary | ICD-10-CM | POA: Diagnosis present

## 2020-12-10 DIAGNOSIS — Z841 Family history of disorders of kidney and ureter: Secondary | ICD-10-CM

## 2020-12-10 DIAGNOSIS — T391X2A Poisoning by 4-Aminophenol derivatives, intentional self-harm, initial encounter: Secondary | ICD-10-CM | POA: Diagnosis present

## 2020-12-10 DIAGNOSIS — J9600 Acute respiratory failure, unspecified whether with hypoxia or hypercapnia: Secondary | ICD-10-CM

## 2020-12-10 DIAGNOSIS — Z79899 Other long term (current) drug therapy: Secondary | ICD-10-CM | POA: Diagnosis not present

## 2020-12-10 DIAGNOSIS — M5430 Sciatica, unspecified side: Secondary | ICD-10-CM | POA: Diagnosis present

## 2020-12-10 DIAGNOSIS — G4733 Obstructive sleep apnea (adult) (pediatric): Secondary | ICD-10-CM | POA: Diagnosis present

## 2020-12-10 DIAGNOSIS — Z7984 Long term (current) use of oral hypoglycemic drugs: Secondary | ICD-10-CM

## 2020-12-10 DIAGNOSIS — Z820 Family history of epilepsy and other diseases of the nervous system: Secondary | ICD-10-CM

## 2020-12-10 DIAGNOSIS — R296 Repeated falls: Secondary | ICD-10-CM | POA: Diagnosis present

## 2020-12-10 DIAGNOSIS — Z7951 Long term (current) use of inhaled steroids: Secondary | ICD-10-CM

## 2020-12-10 DIAGNOSIS — Z7982 Long term (current) use of aspirin: Secondary | ICD-10-CM

## 2020-12-10 DIAGNOSIS — Z8261 Family history of arthritis: Secondary | ICD-10-CM

## 2020-12-10 LAB — CBC WITH DIFFERENTIAL/PLATELET
Abs Immature Granulocytes: 0.02 10*3/uL (ref 0.00–0.07)
Basophils Absolute: 0 10*3/uL (ref 0.0–0.1)
Basophils Relative: 1 %
Eosinophils Absolute: 0.2 10*3/uL (ref 0.0–0.5)
Eosinophils Relative: 3 %
HCT: 36.7 % (ref 36.0–46.0)
Hemoglobin: 11.8 g/dL — ABNORMAL LOW (ref 12.0–15.0)
Immature Granulocytes: 0 %
Lymphocytes Relative: 29 %
Lymphs Abs: 1.7 10*3/uL (ref 0.7–4.0)
MCH: 28.9 pg (ref 26.0–34.0)
MCHC: 32.2 g/dL (ref 30.0–36.0)
MCV: 89.7 fL (ref 80.0–100.0)
Monocytes Absolute: 0.5 10*3/uL (ref 0.1–1.0)
Monocytes Relative: 8 %
Neutro Abs: 3.3 10*3/uL (ref 1.7–7.7)
Neutrophils Relative %: 59 %
Platelets: 226 10*3/uL (ref 150–400)
RBC: 4.09 MIL/uL (ref 3.87–5.11)
RDW: 11.9 % (ref 11.5–15.5)
WBC: 5.7 10*3/uL (ref 4.0–10.5)
nRBC: 0 % (ref 0.0–0.2)

## 2020-12-10 LAB — RAPID URINE DRUG SCREEN, HOSP PERFORMED
Amphetamines: NOT DETECTED
Barbiturates: NOT DETECTED
Benzodiazepines: NOT DETECTED
Cocaine: NOT DETECTED
Opiates: POSITIVE — AB
Tetrahydrocannabinol: NOT DETECTED

## 2020-12-10 LAB — COMPREHENSIVE METABOLIC PANEL
ALT: 16 U/L (ref 0–44)
AST: 26 U/L (ref 15–41)
Albumin: 3.9 g/dL (ref 3.5–5.0)
Alkaline Phosphatase: 104 U/L (ref 38–126)
Anion gap: 10 (ref 5–15)
BUN: 18 mg/dL (ref 8–23)
CO2: 26 mmol/L (ref 22–32)
Calcium: 9.2 mg/dL (ref 8.9–10.3)
Chloride: 100 mmol/L (ref 98–111)
Creatinine, Ser: 1.17 mg/dL — ABNORMAL HIGH (ref 0.44–1.00)
GFR, Estimated: 50 mL/min — ABNORMAL LOW (ref >60)
Glucose, Bld: 171 mg/dL — ABNORMAL HIGH (ref 70–99)
Potassium: 3.6 mmol/L (ref 3.5–5.1)
Sodium: 136 mmol/L (ref 135–145)
Total Bilirubin: 0.6 mg/dL (ref 0.3–1.2)
Total Protein: 7.5 g/dL (ref 6.5–8.1)

## 2020-12-10 LAB — PROTIME-INR
INR: 1 (ref 0.8–1.2)
Prothrombin Time: 13.4 seconds (ref 11.4–15.2)

## 2020-12-10 LAB — CBG MONITORING, ED
Glucose-Capillary: 222 mg/dL — ABNORMAL HIGH (ref 70–99)
Glucose-Capillary: 116 mg/dL — ABNORMAL HIGH (ref 70–99)
Glucose-Capillary: 214 mg/dL — ABNORMAL HIGH (ref 70–99)

## 2020-12-10 LAB — RESP PANEL BY RT-PCR (FLU A&B, COVID) ARPGX2
Influenza A by PCR: NEGATIVE
Influenza B by PCR: NEGATIVE
SARS Coronavirus 2 by RT PCR: NEGATIVE

## 2020-12-10 LAB — ETHANOL: Alcohol, Ethyl (B): <10 mg/dL (ref <10)

## 2020-12-10 LAB — SALICYLATE LEVEL: Salicylate Lvl: <7 mg/dL — ABNORMAL LOW (ref 7.0–30.0)

## 2020-12-10 LAB — APTT: aPTT: 27 seconds (ref 24–36)

## 2020-12-10 LAB — ACETAMINOPHEN LEVEL: Acetaminophen (Tylenol), Serum: 36 ug/mL — ABNORMAL HIGH (ref 10–30)

## 2020-12-10 MED ORDER — ONDANSETRON HCL 4 MG PO TABS
4.0000 mg | ORAL_TABLET | Freq: Four times a day (QID) | ORAL | Status: DC | PRN
  Administered 2020-12-10: 4 mg via ORAL
  Filled 2020-12-10: qty 1

## 2020-12-10 MED ORDER — INSULIN ASPART 100 UNIT/ML IJ SOLN
0.0000 [IU] | Freq: Three times a day (TID) | INTRAMUSCULAR | Status: DC
  Administered 2020-12-10: 5 [IU] via SUBCUTANEOUS
  Administered 2020-12-11: 2 [IU] via SUBCUTANEOUS
  Administered 2020-12-11: 3 [IU] via SUBCUTANEOUS
  Administered 2020-12-11: 2 [IU] via SUBCUTANEOUS
  Administered 2020-12-12: 5 [IU] via SUBCUTANEOUS
  Administered 2020-12-12: 2 [IU] via SUBCUTANEOUS

## 2020-12-10 MED ORDER — POLYETHYLENE GLYCOL 3350 17 G PO PACK: 17.0000 g | PACK | Freq: Every day | ORAL | Status: DC | PRN

## 2020-12-10 MED ORDER — LACTATED RINGERS IV SOLN: INTRAVENOUS | Status: DC

## 2020-12-10 MED ORDER — BISACODYL 5 MG PO TBEC: 5.0000 mg | DELAYED_RELEASE_TABLET | Freq: Every day | ORAL | Status: DC | PRN

## 2020-12-10 MED ORDER — ONDANSETRON HCL 4 MG/2ML IJ SOLN
4.0000 mg | Freq: Once | INTRAMUSCULAR | Status: AC
  Administered 2020-12-10: 4 mg via INTRAVENOUS
  Filled 2020-12-10: qty 2

## 2020-12-10 MED ORDER — ALUM & MAG HYDROXIDE-SIMETH 200-200-20 MG/5ML PO SUSP
30.0000 mL | ORAL | Status: DC | PRN
  Administered 2020-12-10: 30 mL via ORAL
  Filled 2020-12-10: qty 30

## 2020-12-10 MED ORDER — ENOXAPARIN SODIUM 40 MG/0.4ML IJ SOSY
40.0000 mg | PREFILLED_SYRINGE | INTRAMUSCULAR | Status: DC
  Administered 2020-12-10 – 2020-12-11 (×2): 40 mg via SUBCUTANEOUS
  Filled 2020-12-10 (×2): qty 0.4

## 2020-12-10 MED ORDER — MOMETASONE FURO-FORMOTEROL FUM 200-5 MCG/ACT IN AERO
2.0000 | INHALATION_SPRAY | Freq: Two times a day (BID) | RESPIRATORY_TRACT | Status: DC
  Administered 2020-12-10 – 2020-12-11 (×3): 2 via RESPIRATORY_TRACT
  Filled 2020-12-10: qty 8.8

## 2020-12-10 MED ORDER — DEXTROSE 5 % IV SOLN
15.0000 mg/kg/h | INTRAVENOUS | Status: DC
  Administered 2020-12-10 (×2): 15 mg/kg/h via INTRAVENOUS
  Filled 2020-12-10 (×3): qty 90

## 2020-12-10 MED ORDER — HYDRALAZINE HCL 20 MG/ML IJ SOLN: 5.0000 mg | INTRAMUSCULAR | Status: DC | PRN

## 2020-12-10 MED ORDER — SODIUM CHLORIDE 0.9% FLUSH
3.0000 mL | Freq: Two times a day (BID) | INTRAVENOUS | Status: DC
  Administered 2020-12-10 – 2020-12-12 (×4): 3 mL via INTRAVENOUS

## 2020-12-10 MED ORDER — ONDANSETRON HCL 4 MG/2ML IJ SOLN: 4.0000 mg | Freq: Four times a day (QID) | INTRAMUSCULAR | Status: DC | PRN

## 2020-12-10 MED ORDER — ACETYLCYSTEINE LOAD VIA INFUSION
150.0000 mg/kg | Freq: Once | INTRAVENOUS | Status: AC
Start: 2020-12-10 — End: 2020-12-10
  Administered 2020-12-10: 10950 mg via INTRAVENOUS
  Filled 2020-12-10: qty 359

## 2020-12-10 MED ORDER — HYPROMELLOSE (GONIOSCOPIC) 2.5 % OP SOLN: 1.0000 [drp] | Freq: Three times a day (TID) | OPHTHALMIC | Status: DC | PRN

## 2020-12-10 NOTE — ED Notes (Signed)
This RN called Poison control to ask about when we would need to repeat a tylenol level. Per Poison control, this was the first they heard of the pt and opened a new case. Per poison control due to elevated tylenol level, reversal drug needs to be started. The reversal drug is Nacetylcysteine. Per poison control give a  loading dose of 150 mg/kg over an hour on a tele monitor, followed by a maintenance dose 15 mg/kg per hour.  The reversal drip should run for a total of 24 hrs. A repeated tylenol level and liver function 22 hrs into the start of the drip.Marland Kitchen

## 2020-12-10 NOTE — ED Notes (Signed)
Admitting MD at [facility], pt less than participatory, visit incomplete, acetylcysteine bolus started.

## 2020-12-10 NOTE — ED Provider Notes (Signed)
[facility] EMERGENCY DEPARTMENT Provider Note   CSN: [id number] Arrival date & time: 12/10/20  0204     History Chief Complaint  Patient presents with   Drug Overdose   Suicide Attempt    Deanna Watson is a 70 y.o. female who presents emergency department with a chief complaint of intentional overdose.  Patient states that she was been fighting with her husband and at 12:16 AM after a fight she took 56 of his 10-325 mg Percocets in an attempt to harm herself.  She denies using any other drugs, alcohol or other medications.  Patient was apparently waiting for a room when she was found to be hypoxic in the 70s.  She is now on 2 L and protecting her airway.  She is still somnolent but easily arousable and answering questions appropriately.  She denies homicidal ideation, or hallucinations.   Drug Overdose      Past Medical History:  Diagnosis Date   Allergy    Anemia    history of   Arthritis    Asthma    well controlled   Bell's palsy    lasted only about 2 days   Chicken pox    Chronic bronchitis (HCC)    COPD (chronic obstructive pulmonary disease) ([place])    Depression    Diabetes mellitus without complication ([place])    Dysrhythmia    hx of WPW now resolved but has palpatations   Fatty liver    mild   Fibromyalgia    GERD (gastroesophageal reflux disease)    History of blood transfusion    Hyperlipidemia    Hypertension    Mild cardiomegaly    Peripheral neuropathy    PONV (postoperative nausea and vomiting)    Sciatica    WPW (Wolff-Parkinson-White syndrome)     Patient Active Problem List   Diagnosis Date Noted   S/P total knee replacement, right 11/30/2018   Osteoarthritis of right knee 11/26/2018   Pre-op evaluation 11/13/2018   WPW (Wolff-Parkinson-White syndrome) 04/09/2018   PVD (peripheral vascular disease) ([place]) 04/09/2018   Diabetic mononeuropathy associated with type 2 diabetes mellitus ([place]) 03/04/2018   OA  (osteoarthritis) of knee 11/02/2017   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[place]) 11/02/2017   Diabetic retinopathy ([place]) 06/12/2017   Chronic cholecystitis with calculus 01/23/2017   Peripheral neuropathy 12/11/2016   Tubular adenoma of colon 10/15/2016   Medicare annual wellness visit, subsequent 04/04/2016   Routine adult health maintenance 04/04/2016   Type 2 diabetes mellitus without complication, without long-term current use of insulin ([place]) 03/01/2016   Essential hypertension 03/01/2016   Mild episode of recurrent major depressive disorder ([place]) 03/01/2016   Mixed hyperlipidemia 03/01/2016    Past Surgical History:  Procedure Laterality Date   CARDIAC ELECTROPHYSIOLOGY MAPPING AND ABLATION  2009   CERVICAL CONE BIOPSY     CHOLECYSTECTOMY     COLONOSCOPY     JOINT REPLACEMENT     LAPAROSCOPIC CHOLECYSTECTOMY SINGLE SITE WITH INTRAOPERATIVE CHOLANGIOGRAM N/A 01/23/2017   Procedure: LAPAROSCOPIC CHOLECYSTECTOMY SINGLE SITE WITH INTRAOPERATIVE CHOLANGIOGRAM ERAS PATHWAY;  Surgeon: Michael Boston, MD;  Location: [facility];  Service: General;  Laterality: N/A;   LIVER BIOPSY N/A 01/23/2017   Procedure: BIOPSY OF LIVER;  Surgeon: Michael Boston, MD;  Location: [facility];  Service: General;  Laterality: N/A;   REPLACEMENT TOTAL KNEE Left 2005   TOTAL KNEE ARTHROPLASTY Right 11/30/2018   Procedure: RIGHT TOTAL KNEE ARTHROPLASTY;  Surgeon: Frederik Pear, MD;  Location: [facility];  Service: Orthopedics;  Laterality: Right;   UPPER GI ENDOSCOPY       OB History   No obstetric history on file.     Family History  Problem Relation Age of Onset   Arthritis Mother    Heart disease Mother        CHF age 85   Stroke Mother    Hypertension Mother    Kidney disease Mother    Diabetes Mother    Alcohol abuse Father    Epilepsy Father    Heart disease Brother        Died in his sleep age 40.     Ovarian cancer Paternal Aunt    Stomach cancer Paternal Uncle    Breast cancer Maternal Aunt    Stomach cancer  Maternal Uncle    Colon cancer Neg Hx     Social History   Tobacco Use   Smoking status: Never   Smokeless tobacco: Never  Vaping Use   Vaping Use: Never used  Substance Use Topics   Alcohol use: No   Drug use: No    Home Medications Prior to Admission medications   Medication Sig Start Date End Date Taking? Authorizing Provider  albuterol (PROVENTIL) (2.5 MG/3ML) 0.083% nebulizer solution Take 3 mLs (2.5 mg total) by nebulization every 6 (six) hours as needed for wheezing or shortness of breath. Patient not taking: Reported on 11/19/2018 03/06/18   Lance Sell, NP  aspirin EC 81 MG tablet Take 1 tablet (81 mg total) by mouth 2 (two) times daily. 11/30/18   Leighton Parody, PA-C  B Complex-C (SUPER B COMPLEX PO) Take 1 tablet by mouth daily.    [provider]  budesonide-formoterol (SYMBICORT) 160-4.5 MCG/ACT inhaler Inhale 1 puff into the lungs 2 (two) times daily.    [provider]  calcium carbonate (TUMS - DOSED IN MG ELEMENTAL CALCIUM) 500 MG chewable tablet Chew 1-2 tablets by mouth daily as needed for indigestion or heartburn.    [provider]  Camphor-Menthol-Methyl Sal (TIGER BALM MUSCLE RUB EX) Apply 1 application topically daily as needed (pain).    [provider]  celecoxib (CELEBREX) 200 MG capsule Take 1 capsule (200 mg total) by mouth daily. 09/25/18   Janith Lima, MD  escitalopram (LEXAPRO) 20 MG tablet Take 1 tablet (20 mg total) by mouth daily. 01/18/19   Janith Lima, MD  gabapentin (NEURONTIN) 300 MG capsule TAKE 1 CAPSULE THREE TIMES DAILY Patient taking differently: Take 300 mg by mouth 3 (three) times daily.  09/25/18   Janith Lima, MD  glucose blood test strip Use to check blood sugars 2 times daily. Dx Code- E11.41 03/13/17   Lance Sell, NP  hydroxypropyl methylcellulose / hypromellose (ISOPTO TEARS / GONIOVISC) 2.5 % ophthalmic solution Place 1 drop into both eyes 3 (three) times daily as needed  for dry eyes.    [provider]  JARDIANCE 25 MG TABS tablet Take 1 tablet by mouth once daily Patient taking differently: Take 25 mg by mouth daily.  11/08/18   Janith Lima, MD  lisinopril-hydrochlorothiazide (ZESTORETIC) 20-12.5 MG tablet Take 1 tablet by mouth daily. 09/25/18   Janith Lima, MD  lovastatin (MEVACOR) 10 MG tablet Take 1 tablet (10 mg total) by mouth at bedtime. 03/04/18   Lance Sell, NP  Menthol, Topical Analgesic, (BIOFREEZE EX) Apply 1 application topically 2 (two) times daily as needed (pain).    [provider]  metFORMIN (GLUCOPHAGE-XR) 500 MG 24  hr tablet Take 2 tablets (1,000 mg total) by mouth 2 (two) times daily. Patient taking differently: Take 500 mg by mouth 2 (two) times daily.  09/25/18   Janith Lima, MD  omeprazole (PRILOSEC) 20 MG capsule Take 2 capsules (40 mg total) by mouth daily. Patient not taking: Reported on 11/19/2018 12/11/16   Armbruster, Carlota Raspberry, MD  oxyCODONE-acetaminophen (PERCOCET/ROXICET) 5-325 MG tablet Take 1 tablet by mouth every 4 (four) hours as needed for severe pain. 11/30/18   Leighton Parody, PA-C  tiZANidine (ZANAFLEX) 2 MG tablet Take 1 tablet (2 mg total) by mouth every 6 (six) hours as needed. 11/30/18   Leighton Parody, PA-C    Allergies    Patient has no known allergies.  Review of Systems   Review of Systems Ten systems reviewed and are negative for acute change, except as noted in the HPI.   Physical Exam Updated Vital Signs BP 113/61   Pulse 70   Temp 98 F (36.7 C) (Oral)   Resp 15   Ht 5' (1.524 m)   Wt 73 kg   SpO2 98%   BMI 31.44 kg/m   Physical Exam Vitals and nursing note reviewed.  Constitutional:      General: She is not in acute distress.    Appearance: She is well-developed. She is not diaphoretic.  HENT:     Head: Normocephalic and atraumatic.     Right Ear: External ear normal.     Left Ear: External ear normal.     Nose: Nose normal.     Mouth/Throat:      Mouth: Mucous membranes are moist.  Eyes:     General: No scleral icterus.    Conjunctiva/sclera: Conjunctivae normal.  Cardiovascular:     Rate and Rhythm: Normal rate and regular rhythm.     Heart sounds: Normal heart sounds. No murmur heard.   No friction rub. No gallop.  Pulmonary:     Effort: Pulmonary effort is normal. No respiratory distress.     Breath sounds: Normal breath sounds.  Abdominal:     General: Bowel sounds are normal. There is no distension.     Palpations: Abdomen is soft. There is no mass.     Tenderness: There is no abdominal tenderness. There is no guarding.  Musculoskeletal:     Cervical back: Normal range of motion.  Skin:    General: Skin is warm and dry.     Findings: No rash.  Neurological:     Mental Status: She is oriented to person, place, and time. She is lethargic.  Psychiatric:        Behavior: Behavior normal.    ED Results / Procedures / Treatments   Labs (all labs ordered are listed, but only abnormal results are displayed) Labs Reviewed  COMPREHENSIVE METABOLIC PANEL - Abnormal; Notable for the following components:      Result Value   Glucose, Bld 171 (*)    Creatinine, Ser 1.17 (*)    GFR, Estimated 50 (*)    All other components within normal limits  SALICYLATE LEVEL - Abnormal; Notable for the following components:   Salicylate Lvl <0.6 (*)    All other components within normal limits  ACETAMINOPHEN LEVEL - Abnormal; Notable for the following components:   Acetaminophen (Tylenol), Serum 36 (*)    All other components within normal limits  RAPID URINE DRUG SCREEN, HOSP PERFORMED - Abnormal; Notable for the following components:   Opiates POSITIVE (*)  All other components within normal limits  CBC WITH DIFFERENTIAL/PLATELET - Abnormal; Notable for the following components:   Hemoglobin 11.8 (*)    All other components within normal limits  ETHANOL  PROTIME-INR  APTT  HEPATIC FUNCTION PANEL  CBG MONITORING, ED     EKG EKG Interpretation  Date/Time:  Sunday December 10 2020 02:17:51 EDT Ventricular Rate:  70 PR Interval:  160 QRS Duration: 96 QT Interval:  414 QTC Calculation: 447 R Axis:   23 Text Interpretation: Normal sinus rhythm with sinus arrhythmia Normal ECG Confirmed by Orpah Greek [phone]) on 12/10/2020 6:11:00 AM  Radiology No results found.  Procedures .Critical Care Performed by: Margarita Mail, PA-C Authorized by: Margarita Mail, PA-C   Critical care provider statement:    Critical care time (minutes):  40   Critical care time was exclusive of:  Separately billable procedures and treating other patients   Critical care was necessary to treat or prevent imminent or life-threatening deterioration of the following conditions:  [place]oxid[place]   Critical care was time spent personally by me on the following activities:  Development of treatment plan with patient or surrogate, discussions with consultants, evaluation of patient's response to treatment, interpretation of cardiac output measurements, obtaining history from patient or surrogate, re-evaluation of patient's condition, pulse oximetry, ordering and review of laboratory studies and ordering and performing treatments and interventions   Medications Ordered in ED Medications - No data to display  ED Course  I have reviewed the triage vital signs and the nursing notes.  Pertinent labs & imaging results that were available during my care of the patient were reviewed by me and considered in my medical decision making (see chart for details).    MDM Rules/Calculators/A&P                           Patient here with intentional drug overdose on 10-325 Percocet. I reviewed the patient's labs that include CBC with mild anemia, CMP with slightly elevated creatinine which appears to be an AKI, elevated glucose, UDS positive for opiates, Tylenol level elevated at 36, salicylates ethanol PT/INR and APTT within normal  limits.  Poison control contacted recommends starting N-acetylcysteine therapy.  Patient placed on involuntary commitment for intentional overdose.  N-acetylcysteine drip started.  Case discussed with Dr. Lorin Mercy will admit the patient. Final Clinical Impression(s) / ED Diagnoses Final diagnoses:  None    Rx / DC Orders ED Discharge Orders     None        Margarita Mail, PA-C 12/10/20 0804    Orpah Greek, MD 12/11/20 [phone]

## 2020-12-10 NOTE — H&P (Addendum)
History and Physical    Deanna Watson TDV:[id number] DOB: May 10, 1950 DOA: 12/10/2020  PCP: Janith Lima, MD Consultants:  Shirlee More - cardiology Patient coming from:  Home - lives with husband; NOK: Deanna Watson, [phone]  Chief Complaint: Intentional overdose  HPI: Deanna Watson is a 70 y.o. female with medical history significant of COPD; DM; depression/anxiety; HTN; HLD; and WPW presenting with intentional overdose.  She is quite somnolent and reports feeling confused.  She was able to report an intentional overdose after a fight with her husband and then lapsed back to sleep.    ED Course: Took 16 10/325 Percocet at 1216AM with intent to harm herself.  Hypoxic in 70s in ER due to opiate narcosis.  Called Poison Control, APAP level 36, starting NAC.  Involuntarily committed.  On O2, lethargic but arousable.  Review of Systems: Unable to effectively perform  Ambulatory Status:  Ambulates without assistance  COVID Vaccine Status:   Complete  Past Medical History:  Diagnosis Date   Allergy    Anemia    history of   Arthritis    Asthma    well controlled   Bell's palsy    lasted only about 2 days   Chicken pox    Chronic bronchitis (HCC)    COPD (chronic obstructive pulmonary disease) ([place])    Depression    Diabetes mellitus without complication ([place])    Dysrhythmia    hx of WPW now resolved but has palpatations   Fatty liver    mild   Fibromyalgia    GERD (gastroesophageal reflux disease)    History of blood transfusion    Hyperlipidemia    Hypertension    Mild cardiomegaly    Peripheral neuropathy    PONV (postoperative nausea and vomiting)    Sciatica    WPW (Wolff-Parkinson-White syndrome)     Past Surgical History:  Procedure Laterality Date   CARDIAC ELECTROPHYSIOLOGY MAPPING AND ABLATION  2009   CERVICAL CONE BIOPSY     CHOLECYSTECTOMY     COLONOSCOPY     JOINT REPLACEMENT     LAPAROSCOPIC CHOLECYSTECTOMY SINGLE SITE WITH  INTRAOPERATIVE CHOLANGIOGRAM N/A 01/23/2017   Procedure: LAPAROSCOPIC CHOLECYSTECTOMY SINGLE SITE WITH INTRAOPERATIVE CHOLANGIOGRAM ERAS PATHWAY;  Surgeon: Michael Boston, MD;  [facility];  Service: General;  Laterality: N/A;   LIVER BIOPSY N/A 01/23/2017   Procedure: BIOPSY OF LIVER;  Surgeon: Michael Boston, MD;  Location: [facility];  Service: General;  Laterality: N/A;   REPLACEMENT TOTAL KNEE Left 2005   TOTAL KNEE ARTHROPLASTY Right 11/30/2018   Procedure: RIGHT TOTAL KNEE ARTHROPLASTY;  Surgeon: Frederik Pear, MD;  Location[facility];  Service: Orthopedics;  Laterality: Right;   UPPER GI ENDOSCOPY      Social History   Socioeconomic History   Marital status: Married    Spouse name: Not on file   Number of children: 1   Years of education: 12   Highest education level: Not on file  Occupational History   Occupation: Retired  Tobacco Use   Smoking status: Never   Smokeless tobacco: Never  Vaping Use   Vaping Use: Never used  Substance and Sexual Activity   Alcohol use: No   Drug use: No   Sexual activity: Not on file  Other Topics Concern   Not on file  Social History Narrative   Lives at home with husband, dog and bird.     Social Determinants of Health   Financial Resource Strain: Not on file  Food  Insecurity: Not on file  Transportation Needs: Not on file  Physical Activity: Not on file  Stress: Not on file  Social Connections: Not on file  Intimate Partner Violence: Not on file    No Known Allergies  Family History  Problem Relation Age of Onset   Arthritis Mother    Heart disease Mother        CHF age 58   Stroke Mother    Hypertension Mother    Kidney disease Mother    Diabetes Mother    Alcohol abuse Father    Epilepsy Father    Heart disease Brother        Died in his sleep age 54.     Ovarian cancer Paternal Aunt    Stomach cancer Paternal Uncle    Breast cancer Maternal Aunt    Stomach cancer Maternal Uncle    Colon cancer Neg Hx     Prior  to Admission medications   Medication Sig Start Date End Date Taking? Authorizing Provider  albuterol (PROVENTIL) (2.5 MG/3ML) 0.083% nebulizer solution Take 3 mLs (2.5 mg total) by nebulization every 6 (six) hours as needed for wheezing or shortness of breath. Patient not taking: Reported on 11/19/2018 03/06/18   Lance Sell, NP  aspirin EC 81 MG tablet Take 1 tablet (81 mg total) by mouth 2 (two) times daily. 11/30/18   Leighton Parody, PA-C  B Complex-C (SUPER B COMPLEX PO) Take 1 tablet by mouth daily.    [provider]  budesonide-formoterol (SYMBICORT) 160-4.5 MCG/ACT inhaler Inhale 1 puff into the lungs 2 (two) times daily.    [provider]  calcium carbonate (TUMS - DOSED IN MG ELEMENTAL CALCIUM) 500 MG chewable tablet Chew 1-2 tablets by mouth daily as needed for indigestion or heartburn.    [provider]  Camphor-Menthol-Methyl Sal (TIGER BALM MUSCLE RUB EX) Apply 1 application topically daily as needed (pain).    [provider]  celecoxib (CELEBREX) 200 MG capsule Take 1 capsule (200 mg total) by mouth daily. 09/25/18   Janith Lima, MD  escitalopram (LEXAPRO) 20 MG tablet Take 1 tablet (20 mg total) by mouth daily. 01/18/19   Janith Lima, MD  gabapentin (NEURONTIN) 300 MG capsule TAKE 1 CAPSULE THREE TIMES DAILY Patient taking differently: Take 300 mg by mouth 3 (three) times daily.  09/25/18   Janith Lima, MD  glucose blood test strip Use to check blood sugars 2 times daily. Dx Code- E11.41 03/13/17   Lance Sell, NP  hydroxypropyl methylcellulose / hypromellose (ISOPTO TEARS / GONIOVISC) 2.5 % ophthalmic solution Place 1 drop into both eyes 3 (three) times daily as needed for dry eyes.    [provider]  JARDIANCE 25 MG TABS tablet Take 1 tablet by mouth once daily Patient taking differently: Take 25 mg by mouth daily.  11/08/18   Janith Lima, MD  lisinopril-hydrochlorothiazide (ZESTORETIC) 20-12.5 MG tablet  Take 1 tablet by mouth daily. 09/25/18   Janith Lima, MD  lovastatin (MEVACOR) 10 MG tablet Take 1 tablet (10 mg total) by mouth at bedtime. 03/04/18   Lance Sell, NP  Menthol, Topical Analgesic, (BIOFREEZE EX) Apply 1 application topically 2 (two) times daily as needed (pain).    [provider]  metFORMIN (GLUCOPHAGE-XR) 500 MG 24 hr tablet Take 2 tablets (1,000 mg total) by mouth 2 (two) times daily. Patient taking differently: Take 500 mg by mouth 2 (two) times daily.  09/25/18  Janith Lima, MD  omeprazole (PRILOSEC) 20 MG capsule Take 2 capsules (40 mg total) by mouth daily. Patient not taking: Reported on 11/19/2018 12/11/16   Armbruster, Carlota Raspberry, MD  oxyCODONE-acetaminophen (PERCOCET/ROXICET) 5-325 MG tablet Take 1 tablet by mouth every 4 (four) hours as needed for severe pain. 11/30/18   Leighton Parody, PA-C  tiZANidine (ZANAFLEX) 2 MG tablet Take 1 tablet (2 mg total) by mouth every 6 (six) hours as needed. 11/30/18   Leighton Parody, PA-C    Physical Exam: Vitals:   12/10/20 0745 12/10/20 0800 12/10/20 0815 12/10/20 0830  BP: 100/71 104/68 107/70 129/84  Pulse: 67 67 67 67  Resp: 10 (!) 23 13 14   Temp:      TempSrc:      SpO2: 100% 100% 100% 100%  Weight:      Height:         General:  Appears very somnolent but arousable and is in NAD Eyes:  PERRL, EOMI, normal lids, iris ENT:  grossly normal hearing, lips & tongue, mmm; appropriate dentition Neck:  no LAD, masses or thyromegaly Cardiovascular:  RRR, no m/r/g. No LE edema.  Respiratory:   CTA bilaterally with no wheezes/rales/rhonchi.  Normal respiratory effort. Abdomen:  soft, NT, ND Skin:  no rash or induration seen on limited exam Musculoskeletal:  grossly normal tone BUE/BLE, no bony abnormality Psychiatric:  somnolent mood and affect, speech sparse but appropriate Neurologic:  CN 2-12 grossly intact on very limited exam    Radiological Exams on Admission: Independently reviewed - see  discussion in A/P where applicable  No results found.  EKG: Independently reviewed.   217 - NSR with rate 70; no evidence of acute ischemia 649 - NSR with rate 73; IVCD with no evidence of acute ischemia   Labs on Admission: I have personally reviewed the available labs and imaging studies at the time of the admission.  Pertinent labs:   Glucose 171 BUN 18/Creatinine 1.17/GFR 50; 26/0.82/>60 in 11/2018 Unremarkable CBC INR 1.0 APAP 36 ASA <7 UDS + opiates   Assessment/Plan Principal Problem:   Intentional overdose (HCC) Active Problems:   Type 2 diabetes mellitus without complication, without long-term current use of insulin (HCC)   Essential hypertension   Mixed hyperlipidemia   WPW (Wolff-Parkinson-White syndrome)   COPD without exacerbation (HCC)   Class 1 obesity due to excess calories with body mass index (BMI) of 31.0 to 31.9 in adult   Intentional overdose -Patient with h/o depression/anxiety presenting with intentional overdose of 16 Percocet -Tylenol + and so poison control recommended NAC -Salicylate negative -UDS positive for opiates, as expected -Poison Control was contacted by the ER  -Will admit to progressive care and monitor with NAC protocol prior to psych consult -Will need sitter -Suicide precautions -Will need psychiatry consult once medically cleared -Hold all medications for now based on AMS  -NAC was initiated and will be treated based on pharmacy protocol based on the [place] Nomogram to reduce the risk of hepatotoxicity -Her initial APAP level was 36 -Normal LFTs -Recheck CMP in AM  -Hypoxia into the 70s while in the ER waiting room is likely due to narcotic-associated hypoventilation, although COPD is a lesser consideration; will place [place] O2 for prn use  HTN -Hold Zestoretic, Norvasc for now -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  -Weight loss should be encouraged -Outpatient PCP/bariatric medicine f/u encouraged   *Note: medications reviewed by pharmacy but need input from patient when she is more alert.     Note: This patient has been tested and is negative for the novel coronavirus COVID-19. The patient has been fully vaccinated against COVID-19.   Level of care: Progressive DVT prophylaxis:  Lovenox  Code Status:  Full Family Communication: None present; I spoke with the patient's husband by telephone at the time of admission. Disposition Plan:  The patient is from: home  Anticipated d/c is to: inpatient psychiatry  Anticipated d/c date will depend on clinical response to treatment, likely several days  Patient is currently: acutely ill Consults called: Will need psychiatry  Admission status:  Admit - It is my clinical opinion that admission to INPATIENT is reasonable and necessary because of the expectation that this patient will require hospital care that crosses at least 2 midnights to treat this condition based on the medical complexity of the problems presented.  Given the aforementioned information, the predictability of an adverse outcome is felt to be significant.    Karmen Bongo MD [facility]   How to contact the [facility] Attending or Consulting provider [place] or covering provider during after hours [place], for this patient?  Check the care team in [facility] and look for a) attending/consulting [facility] provider listed and b) the [facility] team listed Log into [email or web address] and use [place]'s universal password to access. If you do not have the password, please contact the hospital operator. Locate the [facility] provider you are looking for under [facility] and page to a number that you can be  directly reached. If you still have difficulty reaching the provider, please page the [facility] (Director on Call) for the Hospitalists listed on amion for assistance.   12/10/2020, 9:23 AM

## 2020-12-10 NOTE — Progress Notes (Signed)
Poison Control Consult Documentation  [place] [facility] has been contacted by request of Dr. Lorin Mercy due to the patient's intentional overdose on Percocet. Patient states that she was been fighting with her husband and at midnight last night she took 16 of his 10-325 mg Percocets in an attempt to harm herself.  Labs: Tylenol level 36; Salicylate level nd; Ethanol nd  Treatment Recommendations (See below for [facility] Fact Sheet Regarding NAC Administration):  -For the purposes of acetaminophen overdose, [facility] recommendations are to repeat AST/ALT, Acetaminophen level, and INR at 0630 on 10/24 to determine continued need for IV NAC administration (see below for further details)  -[facility] does not recommend repeating these labs prior to that time for the purposes of acetaminophen toxicity management as treatment recommendations are not likely to be changed prior to that time  -[facility] recommends calling back if any issues occur with IV NAC administration  Recommendations provided to requesting physician.    Lorelei Pont, PharmD, BCPS Emergency Medicine Clinical Pharmacist 12/10/2020 10:44 AM

## 2020-12-10 NOTE — ED Notes (Signed)
Pama Roskos (Spouse) called asking for an update from nurse or doctor. His phone number is [phone].

## 2020-12-10 NOTE — ED Notes (Signed)
Pt daughter took all belongings home

## 2020-12-10 NOTE — ED Notes (Addendum)
Acetylcysteine requested from pharmacy. Pt sleeping/ resting, NAD, calm, interactive, denies pain, nausea, sob, dizziness. VSS on monitor.

## 2020-12-10 NOTE — ED Notes (Signed)
Pt requesting medication for indigestion. Maalox ordered per PRN standing orders.

## 2020-12-10 NOTE — ED Triage Notes (Addendum)
Pt reports taking hydrocodone and oxycodone. Pt reports taking about 16 pills. Pt reports she was trying to go to sleep and not wake up due to problems with her husband. Pt wrote a suicide note before taking the pills.

## 2020-12-10 NOTE — ED Notes (Signed)
Attempted to update pt's husband via phone with pt's permission. Went to VM on multiple occasions. No VM left.

## 2020-12-11 ENCOUNTER — Inpatient Hospital Stay (HOSPITAL_COMMUNITY): Payer: Medicare HMO

## 2020-12-11 ENCOUNTER — Encounter (HOSPITAL_COMMUNITY): Payer: Self-pay | Admitting: Internal Medicine

## 2020-12-11 DIAGNOSIS — F332 Major depressive disorder, recurrent severe without psychotic features: Secondary | ICD-10-CM

## 2020-12-11 DIAGNOSIS — T50902A Poisoning by unspecified drugs, medicaments and biological substances, intentional self-harm, initial encounter: Secondary | ICD-10-CM | POA: Diagnosis not present

## 2020-12-11 DIAGNOSIS — J9600 Acute respiratory failure, unspecified whether with hypoxia or hypercapnia: Secondary | ICD-10-CM

## 2020-12-11 DIAGNOSIS — Z6831 Body mass index (BMI) 31.0-31.9, adult: Secondary | ICD-10-CM | POA: Diagnosis not present

## 2020-12-11 DIAGNOSIS — E119 Type 2 diabetes mellitus without complications: Secondary | ICD-10-CM | POA: Diagnosis not present

## 2020-12-11 DIAGNOSIS — T50912A Poisoning by multiple unspecified drugs, medicaments and biological substances, intentional self-harm, initial encounter: Secondary | ICD-10-CM

## 2020-12-11 DIAGNOSIS — E6609 Other obesity due to excess calories: Secondary | ICD-10-CM | POA: Diagnosis not present

## 2020-12-11 LAB — COMPREHENSIVE METABOLIC PANEL
ALT: 16 U/L (ref 0–44)
AST: 29 U/L (ref 15–41)
Albumin: 3.2 g/dL — ABNORMAL LOW (ref 3.5–5.0)
Alkaline Phosphatase: 70 U/L (ref 38–126)
Anion gap: 10 (ref 5–15)
BUN: 17 mg/dL (ref 8–23)
CO2: 30 mmol/L (ref 22–32)
Calcium: 8.8 mg/dL — ABNORMAL LOW (ref 8.9–10.3)
Chloride: 96 mmol/L — ABNORMAL LOW (ref 98–111)
Creatinine, Ser: 0.89 mg/dL (ref 0.44–1.00)
GFR, Estimated: >60 mL/min (ref >60)
Glucose, Bld: 219 mg/dL — ABNORMAL HIGH (ref 70–99)
Potassium: 4.2 mmol/L (ref 3.5–5.1)
Sodium: 136 mmol/L (ref 135–145)
Total Bilirubin: 1.9 mg/dL — ABNORMAL HIGH (ref 0.3–1.2)
Total Protein: 6.4 g/dL — ABNORMAL LOW (ref 6.5–8.1)

## 2020-12-11 LAB — GLUCOSE, CAPILLARY
Glucose-Capillary: 175 mg/dL — ABNORMAL HIGH (ref 70–99)
Glucose-Capillary: 159 mg/dL — ABNORMAL HIGH (ref 70–99)
Glucose-Capillary: 160 mg/dL — ABNORMAL HIGH (ref 70–99)
Glucose-Capillary: 139 mg/dL — ABNORMAL HIGH (ref 70–99)

## 2020-12-11 LAB — CBC
HCT: 34.3 % — ABNORMAL LOW (ref 36.0–46.0)
Hemoglobin: 11.1 g/dL — ABNORMAL LOW (ref 12.0–15.0)
MCH: 29.2 pg (ref 26.0–34.0)
MCHC: 32.4 g/dL (ref 30.0–36.0)
MCV: 90.3 fL (ref 80.0–100.0)
Platelets: 184 10*3/uL (ref 150–400)
RBC: 3.8 MIL/uL — ABNORMAL LOW (ref 3.87–5.11)
RDW: 12.1 % (ref 11.5–15.5)
WBC: 5 10*3/uL (ref 4.0–10.5)
nRBC: 0 % (ref 0.0–0.2)

## 2020-12-11 LAB — URINALYSIS, ROUTINE W REFLEX MICROSCOPIC
Bilirubin Urine: NEGATIVE
Glucose, UA: NEGATIVE mg/dL
Ketones, ur: 5 mg/dL — AB
Nitrite: NEGATIVE
Protein, ur: 30 mg/dL — AB
Specific Gravity, Urine: 1.016 (ref 1.005–1.030)
pH: 5 (ref 5.0–8.0)

## 2020-12-11 LAB — BRAIN NATRIURETIC PEPTIDE: B Natriuretic Peptide: 77.7 pg/mL (ref 0.0–100.0)

## 2020-12-11 LAB — PROTIME-INR
INR: 1.1 (ref 0.8–1.2)
Prothrombin Time: 14 seconds (ref 11.4–15.2)

## 2020-12-11 LAB — ACETAMINOPHEN LEVEL: Acetaminophen (Tylenol), Serum: <10 ug/mL — ABNORMAL LOW (ref 10–30)

## 2020-12-11 MED ORDER — LISINOPRIL 20 MG PO TABS
20.0000 mg | ORAL_TABLET | Freq: Every day | ORAL | Status: DC
  Administered 2020-12-11 – 2020-12-12 (×2): 20 mg via ORAL
  Filled 2020-12-11 (×2): qty 1

## 2020-12-11 MED ORDER — HYDROCHLOROTHIAZIDE 12.5 MG PO TABS
12.5000 mg | ORAL_TABLET | Freq: Every day | ORAL | Status: DC
  Administered 2020-12-11 – 2020-12-12 (×2): 12.5 mg via ORAL
  Filled 2020-12-11 (×2): qty 1

## 2020-12-11 MED ORDER — ALBUTEROL SULFATE (2.5 MG/3ML) 0.083% IN NEBU: 2.5000 mg | INHALATION_SOLUTION | RESPIRATORY_TRACT | Status: DC | PRN

## 2020-12-11 MED ORDER — AMLODIPINE BESYLATE 10 MG PO TABS
10.0000 mg | ORAL_TABLET | Freq: Every day | ORAL | Status: DC
  Administered 2020-12-11: 10 mg via ORAL
  Filled 2020-12-11: qty 1

## 2020-12-11 MED ORDER — LISINOPRIL-HYDROCHLOROTHIAZIDE 20-12.5 MG PO TABS: 1.0000 | ORAL_TABLET | Freq: Every day | ORAL | Status: DC

## 2020-12-11 NOTE — Progress Notes (Signed)
Progress Note    Deanna Watson  DJM:[id number] DOB: 1950/04/07  DOA: 12/10/2020 PCP: Janith Lima, MD    Brief Narrative:    Medical records reviewed and are as summarized below:  Deanna Watson is an 70 y.o. female  with medical history significant of COPD; DM; depression/anxiety; HTN; HLD; and WPW presenting with intentional overdose.  Plan per psych is: recommendation for the patient to begin seeing a psychiatrist and counselor OP.    Assessment/Plan:   Principal Problem:   Intentional overdose ([place]) Active Problems:   Type 2 diabetes mellitus without complication, without long-term current use of insulin (HCC)   Essential hypertension   Mixed hyperlipidemia   WPW (Wolff-Parkinson-White syndrome)   COPD without exacerbation (HCC)   Class 1 obesity due to excess calories with body mass index (BMI) of 31.0 to 31.9 in adult   Intentional overdose -Patient with h/o depression/anxiety presenting with intentional overdose of 16 Percocet -Tylenol + and so poison control recommended NAC -psych consult: d/c IVC, recommendation for the patient to begin seeing a psychiatrist and counselor OP.     Acute respiratory failure due to hypoxia -incentive spirometry -wean to RA as able -no wheezing but not moving much air -dg chest  Suspected OSA -needs outpatient sleep study  HTN -resume home meds   HLD -Hold Lipitor   DM -hold Glucophage, Jardiance, Amaryl -SSI   COPD -Continue Advair ([place] per formulary)   H/o WPW -s/p ablation in 2009 -resume ASA upon d/c   Obesity Estimated body mass index is 32.89 kg/m as calculated from the following:   Height as of this encounter: 5' (1.524 m).   Weight as of this encounter: 76.4 kg.    Family Communication/Anticipated D/C date and plan/Code Status   DVT prophylaxis: Lovenox ordered. Code Status: Full Code.  Family Communication: at bedside Disposition Plan: Status is: Inpatient  Remains inpatient  appropriate because: further work up of hypoxemia         Medical Consultants:   psych     Subjective:   No SI Does think she has OSA but has never been tested  Objective:    Vitals:   12/11/20 0753 12/11/20 0754 12/11/20 1044 12/11/20 1136  BP: (!) 180/54  (!) 165/59 (!) 149/50  Pulse: 95 84  (!) 110  Resp: 20 20  20   Temp: 99.3 F (37.4 C)   98.7 F (37.1 C)  TempSrc: Oral   Oral  SpO2: 98% 95%  99%  Weight:      Height:        Intake/Output Summary (Last 24 hours) at 12/11/2020 1343 Last data filed at 12/11/2020 1106 Gross per 24 hour  Intake 2058 ml  Output 800 ml  Net 1258 ml   Filed Weights   12/10/20 0212 12/11/20 0600  Weight: 73 kg 76.4 kg    Exam:  General: Appearance:    Obese female in no acute distress     Lungs:     Diminished, not moving much air, no wheezing, on   Heart:    Tachycardic.   MS:   All extremities are intact.    Neurologic:   Awake, alert     Data Reviewed:   I have personally reviewed following labs and imaging studies:  Labs: Labs show the following:   Basic Metabolic Panel: Recent Labs  Lab 12/10/20 0238 12/11/20 0610  NA 136 136  K 3.6 4.2  CL 100 96*  CO2 26 30  GLUCOSE 171*  219*  BUN 18 17  CREATININE 1.17* 0.89  CALCIUM 9.2 8.8*   GFR Estimated Creatinine Clearance: 53 mL/min (by C-G formula based on SCr of 0.89 mg/dL). Liver Function Tests: Recent Labs  Lab 12/10/20 0238 12/11/20 0610  AST 26 29  ALT 16 16  ALKPHOS 104 70  BILITOT 0.6 1.9*  PROT 7.5 6.4*  ALBUMIN 3.9 3.2*   No results for input(s): LIPASE, AMYLASE in the last 168 hours. No results for input(s): AMMONIA in the last 168 hours. Coagulation profile Recent Labs  Lab 12/10/20 0700 12/11/20 0610  INR 1.0 1.1    CBC: Recent Labs  Lab 12/10/20 0238 12/11/20 0610  WBC 5.7 5.0  NEUTROABS 3.3  --   HGB 11.8* 11.1*  HCT 36.7 34.3*  MCV 89.7 90.3  PLT 226 184   Cardiac Enzymes: No results for input(s):  CKTOTAL, CKMB, CKMBINDEX, TROPONINI in the last 168 hours. BNP (last 3 results) No results for input(s): PROBNP in the last 8760 hours. CBG: Recent Labs  Lab 12/10/20 1130 12/10/20 1639 12/10/20 2220 12/11/20 0808 12/11/20 1144  GLUCAP 222* 116* 214* 175* 159*   D-Dimer: No results for input(s): DDIMER in the last 72 hours. Hgb A1c: No results for input(s): HGBA1C in the last 72 hours. Lipid Profile: No results for input(s): CHOL, HDL, LDLCALC, TRIG, CHOLHDL, LDLDIRECT in the last 72 hours. Thyroid function studies: No results for input(s): TSH, T4TOTAL, T3FREE, THYROIDAB in the last 72 hours.  Invalid input(s): FREET3 Anemia work up: No results for input(s): VITAMINB12, FOLATE, FERRITIN, TIBC, IRON, RETICCTPCT in the last 72 hours. Sepsis Labs: Recent Labs  Lab 12/10/20 0238 12/11/20 0610  WBC 5.7 5.0    Microbiology Recent Results (from the past 240 hour(s))  Resp Panel by RT-PCR (Flu A&B, Covid) Nasopharyngeal Swab     Status: None   Collection Time: 12/10/20  7:00 AM   Specimen: Nasopharyngeal Swab; Nasopharyngeal(NP) swabs in vial transport medium  Result Value Ref Range Status   SARS Coronavirus 2 by RT PCR NEGATIVE NEGATIVE Final    Comment: (NOTE) SARS-CoV-2 target nucleic acids are NOT DETECTED.  The SARS-CoV-2 RNA is generally detectable in upper respiratory specimens during the acute phase of infection. The lowest concentration of SARS-CoV-2 viral copies this assay can detect is 138 copies/mL. A negative result does not preclude SARS-Cov-2 infection and should not be used as the sole basis for treatment or other patient management decisions. A negative result may occur with  improper specimen collection/handling, submission of specimen other than nasopharyngeal swab, presence of viral mutation(s) within the areas targeted by this assay, and inadequate number of viral copies(<138 copies/mL). A negative result must be combined with clinical observations,  patient history, and epidemiological information. The expected result is Negative.  Fact Sheet for Patients:  EntrepreneurPulse.com.au  Fact Sheet for Healthcare Providers:  IncredibleEmployment.be  This test is no t yet approved or cleared by the [place] FDA and  has been authorized for detection and/or diagnosis of SARS-CoV-2 by FDA under an Emergency Use Authorization (EUA). This EUA will remain  in effect (meaning this test can be used) for the duration of the COVID-19 declaration under Section 564(b)(1) of the Act, 21 U.S.C.section 360bbb-3(b)(1), unless the authorization is terminated  or revoked sooner.       Influenza A by PCR NEGATIVE NEGATIVE Final   Influenza B by PCR NEGATIVE NEGATIVE Final    Comment: (NOTE) The Xpert Xpress SARS-CoV-2/FLU/RSV plus assay is intended as an aid in the  diagnosis of influenza from Nasopharyngeal swab specimens and should not be used as a sole basis for treatment. Nasal washings and aspirates are unacceptable for Xpert Xpress SARS-CoV-2/FLU/RSV testing.  Fact Sheet for Patients: EntrepreneurPulse.com.au  Fact Sheet for Healthcare Providers: IncredibleEmployment.be  This test is not yet approved or cleared by the [place] FDA and has been authorized for detection and/or diagnosis of SARS-CoV-2 by FDA under an Emergency Use Authorization (EUA). This EUA will remain in effect (meaning this test can be used) for the duration of the COVID-19 declaration under Section 564(b)(1) of the Act, 21 U.S.C. section 360bbb-3(b)(1), unless the authorization is terminated or revoked.  Performed at [facility], [place] [address] [place], [place] [address]     Procedures and diagnostic studies:  No results found.  Medications:    amLODipine  10 mg Oral QHS   enoxaparin (LOVENOX) injection  40 mg Subcutaneous Q24H   lisinopril  20 mg Oral Daily   And    hydrochlorothiazide  12.5 mg Oral Daily   insulin aspart  0-15 Units Subcutaneous TID WC   mometasone-formoterol  2 puff Inhalation BID   sodium chloride flush  3 mL Intravenous Q12H   Continuous Infusions:   LOS: 1 day   Geradine Girt  [facility]   How to contact the [facility] Attending or Consulting provider [place] or covering provider during after hours [place], for this patient?  Check the care team in [facility] and look for a) attending/consulting [facility] provider listed and b) the [facility] team listed Log into [email or web address] and use [place] universal password to access. If you do not have the password, please contact the hospital operator. Locate the [facility] provider you are looking for under [facility] and page to a number that you can be directly reached. If you still have difficulty reaching the provider, please page the [facility] (Director on Call) for the Hospitalists listed on amion for assistance.  12/11/2020, 1:43 PM

## 2020-12-11 NOTE — ED Notes (Signed)
Report called to Eric, RN.

## 2020-12-11 NOTE — Evaluation (Signed)
Physical Therapy Evaluation Patient Details Name: Deanna Watson MRN: [id number] DOB: 07/15/1950 Today's Date: 12/11/2020  History of Present Illness  Pt is a 70 y.o. female admitted 10/23 due to intentional overdose. PMH:  COPD; DM; depression/anxiety; HTN; HLD; WPW syndrome, h/o L TKA 2005, h/o R TKA 2020.   Clinical Impression  Pt admitted with above diagnosis. PTA pt lived at home with her husband, independent mobility/ADLs. On eval, she required supervision bed mobility, min guard assist transfers, and min guard to min/HHA ambulation 200' without AD. SpO2 99% at rest on 3L. Mobilized on RA with desat to 88%. Max HR 131. 3L replaced upon return to room with quick recovery to 99% and HR to 112. Pt will benefit from skilled PT to increase their independence and safety with mobility to allow discharge home.  PT to follow acutely. No follow up services indicated.        Recommendations for follow up therapy are one component of a multi-disciplinary discharge planning process, led by the attending physician.  Recommendations may be updated based on patient status, additional functional criteria and insurance authorization.  Follow Up Recommendations No PT follow up    Assistance Recommended at Discharge Intermittent Supervision/Assistance  Functional Status Assessment Patient has had a recent decline in their functional status and demonstrates the ability to make significant improvements in function in a reasonable and predictable amount of time.  Equipment Recommendations  None recommended by PT    Recommendations for Other Services       Precautions / Restrictions Precautions Precautions: Fall Precaution Comments: reports falls at home      Mobility  Bed Mobility Overal bed mobility: Needs Assistance Bed Mobility: Supine to Sit;Sit to Supine     Supine to sit: Supervision;HOB elevated Sit to supine: Supervision;HOB elevated   General bed mobility comments: +rail, increased  time    Transfers Overall transfer level: Needs assistance Equipment used: 1 person hand held assist Transfers: Sit to/from Stand Sit to Stand: Min guard                Ambulation/Gait Ambulation/Gait assistance: Min guard;Min assist Gait Distance (Feet): 200 Feet Assistive device: 1 person hand held assist;None Gait Pattern/deviations: Step-through pattern;Decreased stride length Gait velocity: decreased Gait velocity interpretation: 1.31 - 2.62 ft/sec, indicative of limited community ambulator General Gait Details: Amb first 100' min/HHA. Amb 2nd 100' min guard assist. Slow, steady gait. No LOB noted. Mobilized on RA with desat to 88%. Returned to 3L in room with SpO2 99%.  Stairs            Wheelchair Mobility    Modified Rankin (Stroke Patients Only)       Balance Overall balance assessment: Mild deficits observed, not formally tested                                           Pertinent Vitals/Pain Pain Assessment: No/denies pain    Home Living Family/patient expects to be discharged to:: Private residence Living Arrangements: Spouse/significant other Available Help at Discharge: Family;Available 24 hours/day Type of Home: Apartment Home Access: Elevator       Home Layout: One level Home Equipment: Conservation officer, nature (2 wheels);Cane - single point;Toilet riser;Shower seat      Prior Function Prior Level of Function : Independent/Modified Independent             Mobility Comments: Pt's husband is  currently in w/c due to ankle fx.       Hand Dominance   Dominant Hand: Right    Extremity/Trunk Assessment   Upper Extremity Assessment Upper Extremity Assessment: Overall WFL for tasks assessed    Lower Extremity Assessment Lower Extremity Assessment: Overall WFL for tasks assessed    Cervical / Trunk Assessment Cervical / Trunk Assessment: Normal  Communication   Communication: No difficulties  Cognition  Arousal/Alertness: Awake/alert Behavior During Therapy: WFL for tasks assessed/performed Overall Cognitive Status: Within Functional Limits for tasks assessed                                          General Comments General comments (skin integrity, edema, etc.): SpO2 99% at rest on 3L. Mobilized on RA with desat to 88%. Max HR 131. 3L replaced upon return to room with quick recovery to 99%.    Exercises     Assessment/Plan    PT Assessment Patient needs continued PT services  PT Problem List Decreased mobility;Decreased balance;Cardiopulmonary status limiting activity;Decreased activity tolerance       PT Treatment Interventions Therapeutic activities;Gait training;Therapeutic exercise;Patient/family education;Balance training;Functional mobility training    PT Goals (Current goals can be found in the Care Plan section)  Acute Rehab PT Goals Patient Stated Goal: home PT Goal Formulation: With patient Time For Goal Achievement: 12/25/20 Potential to Achieve Goals: Good    Frequency Min 3X/week   Barriers to discharge        Co-evaluation               AM-PAC PT "6 Clicks" Mobility  Outcome Measure Help needed turning from your back to your side while in a flat bed without using bedrails?: None Help needed moving from lying on your back to sitting on the side of a flat bed without using bedrails?: A Little Help needed moving to and from a bed to a chair (including a wheelchair)?: A Little Help needed standing up from a chair using your arms (e.g., wheelchair or bedside chair)?: A Little Help needed to walk in hospital room?: A Little Help needed climbing 3-5 steps with a railing? : A Little 6 Click Score: 19    End of Session Equipment Utilized During Treatment: Gait belt;Oxygen Activity Tolerance: Patient tolerated treatment well Patient left: in bed;with call bell/phone within reach;with nursing/sitter in room Nurse Communication: Mobility  status PT Visit Diagnosis: Difficulty in walking, not elsewhere classified (R26.2)    Time: 2992-4268 PT Time Calculation (min) (ACUTE ONLY): 25 min   Charges:   PT Evaluation $PT Eval Moderate Complexity: 1 Mod PT Treatments $Gait Training: 8-22 mins        Lorrin Goodell, PT  Office # [phone] Pager [phone]   Lorriane Shire 12/11/2020, 12:28 PM

## 2020-12-11 NOTE — ED Notes (Signed)
Attempted to call report. Randall Hiss, Agricultural consultant states that he did not know they were getting a patient and he would call the [facility] because they are treading water on the floor. He states he will call me back.

## 2020-12-11 NOTE — ED Notes (Signed)
Breakfast Orders placed 

## 2020-12-11 NOTE — Progress Notes (Signed)
[facility] commitment change form filled out, signed by Dr Eliseo Squires, faxed to [facility] daytime magistrate and placed in chart. Lurline Idol, MSW, LCSW 10/24/20222:37 PM

## 2020-12-11 NOTE — Consult Note (Signed)
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    Musculoskeletal: Strength & Muscle Tone: within normal limits Gait & Station:  remains siting on edge of bed Patient leans: N/A            Psychiatric Specialty Exam:  Presentation  General Appearance: Appropriate for Environment  Eye Contact:Good  Speech:Clear and Coherent  Speech Volume:Normal  Handedness:No data recorded  Mood and Affect  Mood:Euthymic  Affect:Congruent  Thought Process  Thought Processes:Coherent  Descriptions of Associations:Intact  Orientation:Full (Time, Place and Person)  Thought Content:Logical  History of Schizophrenia/Schizoaffective disorder:No data recorded Duration of Psychotic Symptoms:No data  recorded Hallucinations:Hallucinations: None  Ideas of Reference:None  Suicidal Thoughts:Suicidal Thoughts: No  Homicidal Thoughts:Homicidal Thoughts: No   Sensorium  Memory:Immediate Good; Recent Good; Remote Good  Judgment:-- (Improved)  Insight:Fair   Executive Functions  Concentration:Good  Attention Span:Good  Recall:No data recorded Fund of Knowledge:Good  Language:Good   Psychomotor Activity  Psychomotor Activity:Psychomotor Activity: Normal   Assets  Assets:Communication Skills; Desire for Improvement; Resilience; Social Support; Housing   Sleep  Sleep:Sleep: Fair   Physical Exam: Physical Exam Constitutional:      Appearance: Normal appearance.  HENT:     Head: Normocephalic and atraumatic.  Pulmonary:     Effort: Pulmonary effort is normal.  Musculoskeletal:     Cervical back: Normal range of motion.  Skin:    General: Skin is dry.  Neurological:     General: No focal deficit present.     Mental Status: She is alert.   Review of Systems  Psychiatric/Behavioral:  Negative for hallucinations and suicidal ideas.   Blood pressure (!) 149/50, pulse (!) 110, temperature 98.7 F (37.1 C), temperature source Oral, resp. rate 20, height 5' (1.524 m), weight 76.4 kg, SpO2 99 %. Body mass index is 32.89 kg/m.   PGY-2 Freida Busman, MD 12/11/2020 1:26 PM

## 2020-12-11 NOTE — TOC Initial Note (Signed)
Transition of Care [facility]) - Initial/Assessment Note    Patient Details  Name: Deanna Watson MRN: [id number] Date of Birth: 28-Feb-1950  Transition of Care [facility]) CM/SW Contact:    Lorri Frederick, LCSW Phone Number: 12/11/2020, 3:57 PM  Clinical Narrative:  CSW met with pt for initial assessment.  Pt lives at home with husband, permission given to speak with husband Mikle Bosworth, daughter Aldean Jewett.  Discussed no PT recommendations for follow up.  Discussed IVC has been rescinded.  Pt reports no outpt psych providers in place currently.  PCP in place.  Current DME in home: walker, cane, shower chair.  Pt is vaccinated for covid with one booster.                 Expected Discharge Plan: Home/Self Care Barriers to Discharge: Continued Medical Work up   Patient Goals and CMS Choice Patient states their goals for this hospitalization and ongoing recovery are:: "less joint pain" CMS Medicare.gov Compare Post Acute Care list provided to::  (na)    Expected Discharge Plan and Services Expected Discharge Plan: Home/Self Care     Post Acute Care Choice: NA Living arrangements for the past 2 months: Single Family Home                                      Prior Living Arrangements/Services Living arrangements for the past 2 months: Single Family Home Lives with:: Spouse Patient language and need for interpreter reviewed:: Yes Do you feel safe going back to the place where you live?: Yes      Need for Family Participation in Patient Care: Yes (Comment) Care giver support system in place?: Yes (comment) Current home services: Other (comment) (none) Criminal Activity/Legal Involvement Pertinent to Current Situation/Hospitalization: No - Comment as needed  Activities of Daily Living      Permission Sought/Granted Permission sought to share information with : Family Supports Permission granted to share information with : Yes, Verbal Permission Granted  Share Information with NAME:  husband Mikle Bosworth, daughter Aldean Jewett           Emotional Assessment Appearance:: Appears stated age Attitude/Demeanor/Rapport: Engaged Affect (typically observed): Appropriate, Pleasant Orientation: :  (not recorded, appears oriented) Alcohol / Substance Use: Not Applicable Psych Involvement: Yes (comment)  Admission diagnosis:  Intentional overdose (HCC) [T50.902A] Intentional overdose, initial encounter (HCC) [T50.902A] Patient Active Problem List   Diagnosis Date Noted   Acute respiratory failure (HCC) 12/11/2020   Intentional overdose (HCC) 12/10/2020   COPD without exacerbation (HCC) 12/10/2020   Class 1 obesity due to excess calories with body mass index (BMI) of 31.0 to 31.9 in adult 12/10/2020   S/P total knee replacement, right 11/30/2018   Osteoarthritis of right knee 11/26/2018   Pre-op evaluation 11/13/2018   WPW (Wolff-Parkinson-White syndrome) 04/09/2018   PVD (peripheral vascular disease) (HCC) 04/09/2018   Diabetic mononeuropathy associated with type 2 diabetes mellitus (HCC) 03/04/2018   OA (osteoarthritis) of knee 11/02/2017   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HCC) 11/02/2017   Diabetic retinopathy (HCC) 06/12/2017   Chronic cholecystitis with calculus 01/23/2017   Peripheral neuropathy 12/11/2016   Tubular adenoma of colon 10/15/2016   Medicare annual wellness visit, subsequent 04/04/2016   Routine adult health maintenance 04/04/2016   Type 2 diabetes mellitus without complication, without long-term current use of insulin (HCC) 03/01/2016   Essential hypertension 03/01/2016   Mild episode of recurrent major depressive disorder (HCC) 03/01/2016  Mixed hyperlipidemia 03/01/2016   PCP:  Janith Lima, MD Pharmacy:   [facility], [place] [address] Phone: [phone] Fax: [phone]  [place], [address] [place]  [place] [address] Phone: [phone] Fax: [phone]     Social Determinants of Health (SDOH) Interventions    Readmission Risk Interventions No flowsheet data found.

## 2020-12-11 NOTE — Progress Notes (Signed)
SATURATION QUALIFICATIONS: (This note is used to comply with regulatory documentation for home oxygen)  Patient Saturations on Room Air at Rest = 95%  Patient Saturations on Room Air while Ambulating = 89%  Patient Saturations on 2 Liters of oxygen while Ambulating = 98%  Please briefly explain why patient needs home oxygen:Pt was not able to maintain oxygen above 90% while ambulating.

## 2020-12-12 DIAGNOSIS — E6609 Other obesity due to excess calories: Secondary | ICD-10-CM | POA: Diagnosis not present

## 2020-12-12 DIAGNOSIS — F332 Major depressive disorder, recurrent severe without psychotic features: Secondary | ICD-10-CM

## 2020-12-12 DIAGNOSIS — T50902A Poisoning by unspecified drugs, medicaments and biological substances, intentional self-harm, initial encounter: Secondary | ICD-10-CM | POA: Diagnosis not present

## 2020-12-12 DIAGNOSIS — Z6831 Body mass index (BMI) 31.0-31.9, adult: Secondary | ICD-10-CM | POA: Diagnosis not present

## 2020-12-12 LAB — GLUCOSE, CAPILLARY
Glucose-Capillary: 143 mg/dL — ABNORMAL HIGH (ref 70–99)
Glucose-Capillary: 151 mg/dL — ABNORMAL HIGH (ref 70–99)
Glucose-Capillary: 147 mg/dL — ABNORMAL HIGH (ref 70–99)
Glucose-Capillary: 213 mg/dL — ABNORMAL HIGH (ref 70–99)
Glucose-Capillary: 220 mg/dL — ABNORMAL HIGH (ref 70–99)

## 2020-12-12 MED ORDER — NALOXONE HCL 4 MG/0.1ML NA LIQD: NASAL | 1 refills | Status: AC

## 2020-12-12 MED ORDER — AMLODIPINE BESYLATE 5 MG PO TABS: 10.0000 mg | ORAL_TABLET | Freq: Every day | ORAL | 0 refills | Status: AC

## 2020-12-12 NOTE — Progress Notes (Addendum)
SATURATION QUALIFICATIONS: (This note is used to comply with regulatory documentation for home oxygen)  Patient Saturations on Room Air at Rest = 96%  Patient Saturations on Room Air while Ambulating = 94%  Patient Saturations on 0 Liters of oxygen while Ambulating = N/A  Please briefly explain why patient needs home oxygen:pt.Pt. did not require Oxygen while ambulating, saturation above 94%  room air

## 2020-12-12 NOTE — TOC Transition Note (Signed)
Transition of Care [facility]) - CM/SW Discharge Note   Patient Details  Name: Deanna Watson MRN: [id number] Date of Birth: 01/04/1951  Transition of Care [facility]) CM/SW Contact:  Deanna Chars, LCSW Phone Number: 12/12/2020, 1:39 PM   Clinical Narrative:  Pt discharging home to self care.  Attempts made to schedule outpt mental health therapy appointment at [facility].  CSW spoke with pt who does see her PCP for her psychiatric medications.  She will continue to do this and will call Deanna Watson to schedule outpt mental health therapy appt.      Final next level of care: Home/Self Care Barriers to Discharge: Barriers Resolved   Patient Goals and CMS Choice Patient states their goals for this hospitalization and ongoing recovery are:: "less joint pain" CMS Medicare.gov Compare Post Acute Care list provided to::  (na)    Discharge Placement                       Discharge Plan and Services     Post Acute Care Choice: NA          DME Arranged: N/A           HH Agency: NA        Social Determinants of Health (SDOH) Interventions     Readmission Risk Interventions No flowsheet data found.

## 2020-12-12 NOTE — TOC Progression Note (Signed)
Transition of Care [facility]) - Progression Note    Patient Details  Name: Deanna Watson MRN: [id number] Date of Birth: 1950-03-13  Transition [facility] Care [facility]) CM/SW Contact  Joanne Chars, LCSW Phone Number: 12/12/2020, 1:31 PM  Clinical Narrative:  CSW informed by Dr Eliseo Squires that she made internal referral to [facility].   CSW attempted to contact Franki Cabot by phone at [phone].  LM  1320: CSW left a second message at [place] them of pt discharge and asking if they could contact her to arrange outpt appts.      Expected Discharge Plan: Home/Self Care Barriers to Discharge: Continued Medical Work up  Expected Discharge Plan and Services Expected Discharge Plan: Home/Self Care     Post Acute Care Choice: NA Living arrangements for the past 2 months: Single Family Home Expected Discharge Date: 12/12/20                                     Social Determinants of Health (SDOH) Interventions    Readmission Risk Interventions No flowsheet data found.

## 2020-12-12 NOTE — Discharge Summary (Signed)
Physician Discharge Summary  Richetta Cubillos CBJ:[id number] DOB: 1950-03-15 DOA: 12/10/2020  PCP: Janith Lima, MD  Admit date: 12/10/2020 Discharge date: 12/12/2020  Admitted From: home Discharge disposition: home   Recommendations for Outpatient Follow-Up:   Outpatient psych in [place] Needs outpatient sleep study   Discharge Diagnosis:   Principal Problem:   Intentional overdose [facility]) Active Problems:   Type 2 diabetes mellitus without complication, without long-term current use of insulin ([place])   Essential hypertension   Mixed hyperlipidemia   WPW (Wolff-Parkinson-White syndrome)   COPD without exacerbation (HCC)   Class 1 obesity due to excess calories with body mass index (BMI) of 31.0 to 31.9 in adult   Acute respiratory failure ([place])    Discharge Condition: Improved.  Diet recommendation: Low sodium, heart healthy  Wound care: None.  Code status: Full.   History of Present Illness:   Deanna Watson is an 69 y.o. female  with medical history significant of COPD; DM; depression/anxiety; HTN; HLD; and WPW presenting with intentional overdose.  Plan per psych is: recommendation for the patient to begin seeing a psychiatrist and counselor OP.    Hospital Course by Problem:   Intentional overdose -Patient with h/o depression/anxiety presenting with intentional overdose of 16 Percocet -Tylenol + and so poison control recommended NAC -psych consult: d/c IVC, recommendation for the patient to begin seeing a psychiatrist and counselor OP.     Acute respiratory failure due to hypoxia -resolved -x ray shows atelectatics    Suspected OSA -needs outpatient sleep study   HTN -resume home meds   HLD -Hold Lipitor   DM -resume home meds   COPD -Continue Advair (Dulera per formulary)   H/o WPW -s/p ablation in 2009 -resume ASA upon d/c   Obesity Estimated body mass index is 32.89 kg/m as calculated from the following:   Height  as of this encounter: 5' (1.524 m).   Weight as of this encounter: 76.4 kg.     Medical Consultants:  psych    Discharge Exam:   Vitals:   12/12/20 0435 12/12/20 0938  BP: (!) 135/56 (!) 129/57  Pulse: 93 88  Resp: 18 18  Temp: 98.4 F (36.9 C) 98.1 F (36.7 C)  SpO2: 93% 92%   Vitals:   12/11/20 1918 12/11/20 1954 12/12/20 0435 12/12/20 0938  BP: (!) 137/56  (!) 135/56 (!) 129/57  Pulse: (!) 102  93 88  Resp: _0 Temp: 99.3 F (37.4 C)  98.4 F (36.9 C) 98.1 F (36.7 C)  TempSrc:      SpO2: 91% 93% 93% 92%  Weight:      Height:        General exam: Appears calm and comfortable.   The results of significant diagnostics from this hospitalization (including imaging, microbiology, ancillary and laboratory) are listed below for reference.     Procedures and Diagnostic Studies:   DG Chest 2 View  Result Date: 12/11/2020 CLINICAL DATA:  Hypoxia R09.02 (ICD-10-CM) EXAM: CHEST - 2 VIEW COMPARISON:  November 23, 2018. FINDINGS: Similar cardiomediastinal silhouette. Calcific atherosclerosis. No confluent consolidation. Mild streaky opacities at the lung bases. Small pulmonary nodules better characterized on recent CT chest. No definite pleural effusions or pneumothorax. Blunted right costophrenic sulcus, likely pleural thickening when correlating with prior CT. Biapical pleuroparenchymal scarring. Degenerative changes of the thoracic spine with osteopenia. IMPRESSION: Mild streaky opacities at the lung bases, most likely atelectasis. Electronically Signed   By: Margaretha Sheffield  M.D.   On: 12/11/2020 20:05     Labs:   Basic Metabolic Panel: Recent Labs  Lab 12/10/20 0238 12/11/20 0610  NA 136 136  K 3.6 4.2  CL 100 96*  CO2 26 30  GLUCOSE 171* 219*  BUN 18 17  CREATININE 1.17* 0.89  CALCIUM 9.2 8.8*   GFR Estimated Creatinine Clearance: 53 mL/min (by C-G formula based on SCr of 0.89 mg/dL). Liver Function Tests: Recent Labs  Lab 12/10/20 0238  12/11/20 0610  AST 26 29  ALT 16 16  ALKPHOS 104 70  BILITOT 0.6 1.9*  PROT 7.5 6.4*  ALBUMIN 3.9 3.2*   No results for input(s): LIPASE, AMYLASE in the last 168 hours. No results for input(s): AMMONIA in the last 168 hours. Coagulation profile Recent Labs  Lab 12/10/20 0700 12/11/20 0610  INR 1.0 1.1    CBC: Recent Labs  Lab 12/10/20 0238 12/11/20 0610  WBC 5.7 5.0  NEUTROABS 3.3  --   HGB 11.8* 11.1*  HCT 36.7 34.3*  MCV 89.7 90.3  PLT 226 184   Cardiac Enzymes: No results for input(s): CKTOTAL, CKMB, CKMBINDEX, TROPONINI in the last 168 hours. BNP: Invalid input(s): POCBNP CBG: Recent Labs  Lab 12/12/20 0622 12/12/20 0722 12/12/20 0933 12/12/20 1120 12/12/20 1342  GLUCAP 143* 151* 147* 213* 220*   D-Dimer No results for input(s): DDIMER in the last 72 hours. Hgb A1c No results for input(s): HGBA1C in the last 72 hours. Lipid Profile No results for input(s): CHOL, HDL, LDLCALC, TRIG, CHOLHDL, LDLDIRECT in the last 72 hours. Thyroid function studies No results for input(s): TSH, T4TOTAL, T3FREE, THYROIDAB in the last 72 hours.  Invalid input(s): FREET3 Anemia work up No results for input(s): VITAMINB12, FOLATE, FERRITIN, TIBC, IRON, RETICCTPCT in the last 72 hours. Microbiology Recent Results (from the past 240 hour(s))  Resp Panel by RT-PCR (Flu A&B, Covid) Nasopharyngeal Swab     Status: None   Collection Time: 12/10/20  7:00 AM   Specimen: Nasopharyngeal Swab; Nasopharyngeal(NP) swabs in vial transport medium  Result Value Ref Range Status   SARS Coronavirus 2 by RT PCR NEGATIVE NEGATIVE Final    Comment: (NOTE) SARS-CoV-2 target nucleic acids are NOT DETECTED.  The SARS-CoV-2 RNA is generally detectable in upper respiratory specimens during the acute phase of infection. The lowest concentration of SARS-CoV-2 viral copies this assay can detect is 138 copies/mL. A negative result does not preclude SARS-Cov-2 infection and should not be used as  the sole basis for treatment or other patient management decisions. A negative result may occur with  improper specimen collection/handling, submission of specimen other than nasopharyngeal swab, presence of viral mutation(s) within the areas targeted by this assay, and inadequate number of viral copies(<138 copies/mL). A negative result must be combined with clinical observations, patient history, and epidemiological information. The expected result is Negative.  Fact Sheet for Patients:  EntrepreneurPulse.com.au  Fact Sheet for Healthcare Providers:  IncredibleEmployment.be  This test is no t yet approved or cleared by the [place] FDA and  has been authorized for detection and/or diagnosis of SARS-CoV-2 by FDA under an Emergency Use Authorization (EUA). This EUA will remain  in effect (meaning this test can be used) for the duration of the COVID-19 declaration under Section 564(b)(1) of the Act, 21 U.S.C.section 360bbb-3(b)(1), unless the authorization is terminated  or revoked sooner.       Influenza A by PCR NEGATIVE NEGATIVE Final   Influenza B by PCR NEGATIVE NEGATIVE Final  Comment: (NOTE) The Xpert Xpress SARS-CoV-2/FLU/RSV plus assay is intended as an aid in the diagnosis of influenza from Nasopharyngeal swab specimens and should not be used as a sole basis for treatment. Nasal washings and aspirates are unacceptable for Xpert Xpress SARS-CoV-2/FLU/RSV testing.  Fact Sheet for Patients: EntrepreneurPulse.com.au  Fact Sheet for Healthcare Providers: IncredibleEmployment.be  This test is not yet approved or cleared by the [place] FDA and has been authorized for detection and/or diagnosis of SARS-CoV-2 by FDA under an Emergency Use Authorization (EUA). This EUA will remain in effect (meaning this test can be used) for the duration of the COVID-19 declaration under Section 564(b)(1) of  the Act, 21 U.S.C. section 360bbb-3(b)(1), unless the authorization is terminated or revoked.  Performed at [facility], [place] [address], [place], [place] [address]      Discharge Instructions:   Discharge Instructions     Ambulatory referral to Psychiatry   Complete by: As directed    Diet Carb Modified   Complete by: As directed    Increase activity slowly   Complete by: As directed       Allergies as of 12/12/2020   No Known Allergies      Medication List     STOP taking these medications    acetaminophen 500 MG tablet Commonly known as: TYLENOL   celecoxib 200 MG capsule Commonly known as: CELEBREX   gabapentin 300 MG capsule Commonly known as: NEURONTIN   oxyCODONE-acetaminophen 5-325 MG tablet Commonly known as: PERCOCET/ROXICET       TAKE these medications    albuterol 108 (90 Base) MCG/ACT inhaler Commonly known as: VENTOLIN HFA Inhale 1 puff into the lungs every 6 (six) hours as needed for wheezing or shortness of breath.   amLODipine 5 MG tablet Commonly known as: NORVASC Take 2 tablets (10 mg total) by mouth at bedtime.   aspirin EC 81 MG tablet Take 1 tablet (81 mg total) by mouth 2 (two) times daily. What changed: when to take this   atorvastatin 20 MG tablet Commonly known as: LIPITOR Take 20 mg by mouth at bedtime.   budesonide-formoterol 160-4.5 MCG/ACT inhaler Commonly known as: SYMBICORT Inhale 1 puff into the lungs 2 (two) times daily.   busPIRone 10 MG tablet Commonly known as: BUSPAR Take 10 mg by mouth every morning.   CALCIUM CARBONATE ANTACID PO Take 1 tablet by mouth 2 (two) times daily as needed for indigestion or heartburn.   escitalopram 20 MG tablet Commonly known as: LEXAPRO Take 1 tablet (20 mg total) by mouth daily. What changed: when to take this   fluticasone-salmeterol 250-50 MCG/ACT Aepb Commonly known as: ADVAIR Inhale 1 puff into the lungs at bedtime.   glimepiride 4 MG tablet Commonly  known as: AMARYL Take 4 mg by mouth every morning.   glucose blood test strip Use to check blood sugars 2 times daily. Dx Code- E11.41   GOLD BOND EX Apply 1 application topically daily as needed (pain).   hydroxypropyl methylcellulose / hypromellose 2.5 % ophthalmic solution Commonly known as: ISOPTO TEARS / GONIOVISC Place 1 drop into both eyes 3 (three) times daily as needed for dry eyes.   Jardiance 25 MG Tabs tablet Generic drug: empagliflozin Take 1 tablet by mouth once daily   lisinopril-hydrochlorothiazide 20-12.5 MG tablet Commonly known as: ZESTORETIC Take 1 tablet by mouth daily. What changed: when to take this   metFORMIN 500 MG 24 hr tablet Commonly known as: GLUCOPHAGE-XR Take 2 tablets (1,000 mg  total) by mouth 2 (two) times daily.   naloxone 4 MG/0.1ML Liqd nasal spray kit Commonly known as: NARCAN Use for opioid OD   REFRESH OP Place 1 drop into both eyes at bedtime as needed (itching/dry eyes).   SUPER B COMPLEX PO Take 1 tablet by mouth every morning.   Vitamin D-3 125 MCG (5000 UT) Tabs Take 5,000 mg by mouth every morning.        [facility]-up [facility] Follow up.   Why: Please contact this office to schedule an outpatient therapy appointment as soon as possible. Contact information: [id number]  [place], [place] [address] P: [phone]                 Time coordinating discharge: 35 min  Signed:  Geradine Girt DO  [facility] 12/12/2020, 3:02 PM

## 2021-04-10 ENCOUNTER — Other Ambulatory Visit: Payer: Self-pay

## 2021-04-10 ENCOUNTER — Ambulatory Visit: Payer: Medicare HMO | Admitting: Podiatry

## 2021-04-10 ENCOUNTER — Ambulatory Visit (INDEPENDENT_AMBULATORY_CARE_PROVIDER_SITE_OTHER): Payer: Medicare HMO

## 2021-04-10 DIAGNOSIS — M2042 Other hammer toe(s) (acquired), left foot: Secondary | ICD-10-CM

## 2021-04-10 DIAGNOSIS — G629 Polyneuropathy, unspecified: Secondary | ICD-10-CM | POA: Diagnosis not present

## 2021-04-10 DIAGNOSIS — M79672 Pain in left foot: Secondary | ICD-10-CM

## 2021-04-10 DIAGNOSIS — Q828 Other specified congenital malformations of skin: Secondary | ICD-10-CM

## 2021-04-10 DIAGNOSIS — I739 Peripheral vascular disease, unspecified: Secondary | ICD-10-CM | POA: Diagnosis not present

## 2021-04-10 DIAGNOSIS — M2041 Other hammer toe(s) (acquired), right foot: Secondary | ICD-10-CM

## 2021-04-10 DIAGNOSIS — M79671 Pain in right foot: Secondary | ICD-10-CM

## 2021-04-10 MED ORDER — PREGABALIN 75 MG PO CAPS: 75.0000 mg | ORAL_CAPSULE | Freq: Two times a day (BID) | ORAL | 0 refills | Status: DC

## 2021-04-10 NOTE — Patient Instructions (Signed)
Hammer Toe Hammer toe is a change in the shape, or a deformity, of the toe. The deformity causes the middle joint of the toe to stay bent. Hammer toe starts gradually. At first, the toe can be straightened. Then over time, the toe deformity becomes stiff, inflexible, and permanently bent. Hammer toe usually affects the second, third, or fourth toe. A hammer toe causes pain, especially when wearing shoes. Corns and calluses can result from the toe rubbing against the inside of the shoe. Early treatments to keep the toe straight may relieve pain. As the deformity of the toe becomes stiff and permanent, surgery may be needed to straighten the toe. What are the causes? This condition is caused by abnormal bending of the toe joint that is closest to your foot. Over time, the toe bending downward pulls on the muscles and connections (tendons) of the toe joint, making them weak and stiff. Wearing shoes that are too narrow in the toe box and do not allow toes to fully straighten can cause this condition. What increases the risk? You are more likely to develop this condition if you: Are an older female. Wear shoes that are too small, or wear high-heeled shoes that pinch your toes. Have a second toe that is longer than your big toe (first toe). Injure your foot or toe. Have arthritis, or have a nerve or muscle disorder. Have diabetes or a condition known as Charcot joint, which may cause you to walk abnormally. Have a family history of hammer toe. Are a Engineer, mining. What are the signs or symptoms? Pain and deformity of the toe are the main symptoms of this condition. The pain is worse when wearing shoes, walking, or running. Other symptoms may include: A thickened patch of skin, called a corn or callus, that forms over the top of the bent part of the toe or between the toes. Redness and a burning feeling on the bent toe. An open sore that forms on the top of the bent toe. Not being able to straighten  the affected toe. How is this diagnosed? This condition is diagnosed based on your symptoms and a physical exam. During the exam, your health care provider will try to straighten your toe to see how stiff the deformity is. You may also have tests, such as: A blood test to check for rheumatoid arthritis or diabetes. An X-ray to show how severe the toe deformity is. How is this treated? Treatment for this condition depends on whether the toe is flexible or deformed and no longer moveable. In less severe cases, a hammer toe can be straightened without surgery. These treatments include: Taping the toe into a straightened position. Using pads and cushions to protect the bent toe. Wearing shoes that provide enough room for the toes. Doing toe-stretching exercises at home. Taking an NSAID, such as ibuprofen, to reduce pain and swelling. Using special orthotics or insoles for pain relief and to improve walking. If these treatments do not help or the toe has a severe deformity and cannot be straightened, surgery is the next option. The most common surgeries used to straighten a hammer toe include: Arthroplasty or osteotomy. Part of the toe joint is reconstructed or removed, which allows the toe to straighten. Fusion. Cartilage between the two bones of the joint is taken out, and the bones are fused together into one longer bone. Implantation. Part of the bone is removed and replaced with an implant to allow the toe to move again. Flexor tendon transfer.  The tendons that curl the toes down (flexor tendons) are repositioned. Follow these instructions at home: Take over-the-counter and prescription medicines only as told by your health care provider. Do toe-straightening and stretching exercises as told by your health care provider. Keep all follow-up visits. This is important. How is this prevented? Wear shoes that fit properly and give your toes enough room. Shoes should not cause pain. Buy shoes at  the end of the day to make sure they fit well, since your foot may swell during the day. Make sure they are comfortable before you buy them. As you age, your shoe size might change, including the width. Measure both feet and buy shoes for the larger foot. A shoe repair store might be able to stretch shoes that feel tight in spots. Do not wear high-heeled shoes or shoes with pointed toes. Contact a health care provider if: Your pain gets worse. Your toe becomes red or swollen. You develop an open sore on your toe. Summary Hammer toe is a condition that gradually causes your toe to become bent and stiff. Hammer toe can be treated by taping the toe into a straightened position and doing toe-stretching exercises. If these treatments do not help, surgery may be needed. To prevent this condition, wear shoes that fit properly, give your toes enough room, and do not cause pain. This information is not intended to replace advice given to you by your health care provider. Make sure you discuss any questions you have with your health care provider. Document Revised: 05/13/2019 Document Reviewed: 05/13/2019 Elsevier Patient Education  [place]. Pregabalin Capsules What is this medication? PREGABALIN (pre GAB a lin) treats nerve pain. It may also be used to prevent and control seizures in people with epilepsy. It works by calming overactive nerves in your body. This medicine may be used for other purposes; ask your health care provider or pharmacist if you have questions. COMMON BRAND NAME(S): Lyrica What should I tell my care team before I take this medication? They need to know if you have any of these conditions: Drug abuse or addiction Heart failure Kidney disease Lung disease Suicidal thoughts, plans or attempt An unusual or allergic reaction to pregabalin, other medications, foods, dyes, or preservatives Pregnant or trying to get pregnant Breast-feeding How should I use this  medication? Take this medication by mouth with water. Take it as directed on the prescription label at the same time every day. You can take it with or without food. If it upsets your stomach, take it with food. Keep taking it unless your care team tells you to stop. A special MedGuide will be given to you by the pharmacist with each prescription and refill. Be sure to read this information carefully each time. Talk to your care team about the use of this medication in children. While it may be prescribed for children as young as 1 month for selected conditions, precautions do apply. Overdosage: If you think you have taken too much of this medicine contact a poison control center or emergency room at once. NOTE: This medicine is only for you. Do not share this medicine with others. What if I miss a dose? If you miss a dose, take it as soon as you can. If it is almost time for your next dose, take only that dose. Do not take double or extra doses. What may interact with this medication? Alcohol Antihistamines for allergy, cough, and cold Certain medications for anxiety or sleep Certain medications for  blood pressure, heart disease Certain medications for depression like amitriptyline, fluoxetine, sertraline Certain medications for diabetes, like pioglitazone, rosiglitazone Certain medications for seizures like phenobarbital, primidone General anesthetics like halothane, isoflurane, methoxyflurane, propofol Medications that relax muscles for surgery Narcotic medications for pain Phenothiazines like chlorpromazine, mesoridazine, prochlorperazine, thioridazine This list may not describe all possible interactions. Give your health care provider a list of all the medicines, herbs, non-prescription drugs, or dietary supplements you use. Also tell them if you smoke, drink alcohol, or use illegal drugs. Some items may interact with your medicine. What should I watch for while using this  medication? Visit your care team for regular checks on your progress. Tell your care team if your symptoms do not start to get better or if they get worse. Do not suddenly stop taking this medication. You may develop a severe reaction. Your care team will tell you how much medication to take. If your care team wants you to stop the medication, the dose may be slowly lowered over time to avoid any side effects. You may get drowsy or dizzy. Do not drive, use machinery, or do anything that needs mental alertness until you know how this medication affects you. Do not stand up or sit up quickly, especially if you are an older patient. This reduces the risk of dizzy or fainting spells. Alcohol may interfere with the effect of this medication. Avoid alcoholic drinks. If you or your family notice any changes in your behavior, such as new or worsening depression, thoughts of harming yourself, anxiety, other unusual or disturbing thoughts, or memory loss, call your care team right away. Wear a medical ID bracelet or chain if you are taking this medication for seizures. Carry a card that describes your condition. List the medications and doses you take on the card. This medication may make it more difficult to father a child. Talk to your care team if you are concerned about your fertility. What side effects may I notice from receiving this medication? Side effects that you should report to your care team as soon as possible: Allergic reactions or angioedema--skin rash, itching, hives, swelling of the face, eyes, lips, tongue, arms, or legs, trouble swallowing or breathing Blurry vision Thoughts of suicide or self-harm, worsening mood, feelings of depression Trouble breathing Side effects that usually do not require medical attention (report to your care team if they continue or are bothersome): Dizziness Drowsiness Dry mouth Nausea Swelling of the ankles, feet, hands Vomiting Weight gain This list may not  describe all possible side effects. Call your doctor for medical advice about side effects. You may report side effects to FDA at 1-800-FDA-1088. Where should I keep my medication? Keep out of the reach of children and pets. This medication can be abused. Keep it in a safe place to protect it from theft. Do not share it with anyone. It is only for you. Selling or giving away this medication is dangerous and against the law. Store at [employer] [address] (77 degrees F). Get rid of any unused medication after the expiration date. This medication may cause harm and death if it is taken by other adults, children, or pets. It is important to get rid of the medication as soon as you no longer need it, or it is expired. You can do this in two ways: Take the medication to a medication take-back program. Check with your pharmacy or law enforcement to find a location. If you cannot return the medication, check the label or  package insert to see if the medication should be thrown out in the garbage or flushed down the toilet. If you are not sure, ask your care team. If it is safe to put it in the trash, take the medication out of the container. Mix the medication with cat litter, dirt, coffee grounds, or other unwanted substance. Seal the mixture in a bag or container. Put it in the trash. NOTE: This sheet is a summary. It may not cover all possible information. If you have questions about this medicine, talk to your doctor, pharmacist, or health care provider.  2022 Elsevier/Gold Standard (2020-02-09 00:00:00)

## 2021-04-12 ENCOUNTER — Inpatient Hospital Stay (HOSPITAL_COMMUNITY): Admission: RE | Admit: 2021-04-12 | Payer: Medicare HMO | Source: Ambulatory Visit

## 2021-04-15 NOTE — Progress Notes (Signed)
Subjective:   Patient ID: Deanna Watson, female   DOB: 71 y.o.   MRN: [id number]   HPI 71 year old female presents the office with concerns about hammertoes as well as for painful callus on the left fourth toe.  This is been ongoing for about 20 years.  Pain is intermittent.  Left side worse than right.  Hurts more with wearing shoes.  She is describing burning sensations to her feet.  She has a remote history of a dog bite to her left second toe in 2020.  She feels that since then that is when the hammertoes worsen.  She went to the ball of her foot and occasionally has discomfort submetatarsal 2 and at times will feel that she is walking on the ball however not currently experiencing the symptoms.  She is diabetic and last A1c was 6.3 months blood sugar was 177.   Review of Systems  All other systems reviewed and are negative.  Past Medical History:  Diagnosis Date   Allergy    Anemia    history of   Arthritis    Asthma    well controlled   Bell's palsy    lasted only about 2 days   Chicken pox    Chronic bronchitis (HCC)    COPD (chronic obstructive pulmonary disease) ([place])    Depression    Diabetes mellitus without complication ([place])    Dysrhythmia    hx of WPW now resolved but has palpatations   Fatty liver    mild   Fibromyalgia    GERD (gastroesophageal reflux disease)    History of blood transfusion    Hyperlipidemia    Hypertension    Mild cardiomegaly    Peripheral neuropathy    PONV (postoperative nausea and vomiting)    Sciatica    WPW (Wolff-Parkinson-White syndrome)     Past Surgical History:  Procedure Laterality Date   CARDIAC ELECTROPHYSIOLOGY MAPPING AND ABLATION  2009   CERVICAL CONE BIOPSY     CHOLECYSTECTOMY     COLONOSCOPY     JOINT REPLACEMENT     LAPAROSCOPIC CHOLECYSTECTOMY SINGLE SITE WITH INTRAOPERATIVE CHOLANGIOGRAM N/A 01/23/2017   Procedure: LAPAROSCOPIC CHOLECYSTECTOMY SINGLE SITE WITH INTRAOPERATIVE CHOLANGIOGRAM ERAS PATHWAY;   Surgeon: Michael Boston, MD;  Location: [facility];  Service: General;  Laterality: N/A;   LIVER BIOPSY N/A 01/23/2017   Procedure: BIOPSY OF LIVER;  Surgeon: Michael Boston, MD;  Location: [facility];  Service: General;  Laterality: N/A;   REPLACEMENT TOTAL KNEE Left 2005   TOTAL KNEE ARTHROPLASTY Right 11/30/2018   Procedure: RIGHT TOTAL KNEE ARTHROPLASTY;  Surgeon: Frederik Pear, MD;  Location: [facility];  Service: Orthopedics;  Laterality: Right;   UPPER GI ENDOSCOPY       Current Outpatient Medications:    pregabalin (LYRICA) 75 MG capsule, Take 1 capsule (75 mg total) by mouth 2 (two) times daily., Disp: 60 capsule, Rfl: 0   albuterol (VENTOLIN HFA) 108 (90 Base) MCG/ACT inhaler, Inhale 1 puff into the lungs every 6 (six) hours as needed for wheezing or shortness of breath., Disp: , Rfl:    amLODipine (NORVASC) 5 MG tablet, Take 2 tablets (10 mg total) by mouth at bedtime., Disp: 30 tablet, Rfl: 0   aspirin EC 81 MG tablet, Take 1 tablet (81 mg total) by mouth 2 (two) times daily. (Patient taking differently: Take 81 mg by mouth every morning.), Disp: 60 tablet, Rfl: 0   atorvastatin (LIPITOR) 20 MG tablet, Take 20 mg by mouth at bedtime., Disp: ,  Rfl:    B Complex-C (SUPER B COMPLEX PO), Take 1 tablet by mouth every morning., Disp: , Rfl:    budesonide-formoterol (SYMBICORT) 160-4.5 MCG/ACT inhaler, Inhale 1 puff into the lungs 2 (two) times daily. (Patient not taking: No sig reported), Disp: , Rfl:    busPIRone (BUSPAR) 10 MG tablet, Take 10 mg by mouth every morning., Disp: , Rfl:    CALCIUM CARBONATE ANTACID PO, Take 1 tablet by mouth 2 (two) times daily as needed for indigestion or heartburn., Disp: , Rfl:    Cholecalciferol (VITAMIN D-3) 125 MCG (5000 UT) TABS, Take 5,000 mg by mouth every morning., Disp: , Rfl:    escitalopram (LEXAPRO) 20 MG tablet, Take 1 tablet (20 mg total) by mouth daily. (Patient taking differently: Take 20 mg by mouth every morning.), Disp: 90 tablet, Rfl: 1    fluticasone-salmeterol (ADVAIR) 250-50 MCG/ACT AEPB, Inhale 1 puff into the lungs at bedtime., Disp: , Rfl:    glimepiride (AMARYL) 4 MG tablet, Take 4 mg by mouth every morning., Disp: , Rfl:    glucose blood test strip, Use to check blood sugars 2 times daily. Dx Code- E11.41, Disp: 100 each, Rfl: 12   hydroxypropyl methylcellulose / hypromellose (ISOPTO TEARS / GONIOVISC) 2.5 % ophthalmic solution, Place 1 drop into both eyes 3 (three) times daily as needed for dry eyes. (Patient not taking: No sig reported), Disp: , Rfl:    JARDIANCE 25 MG TABS tablet, Take 1 tablet by mouth once daily (Patient not taking: No sig reported), Disp: 90 tablet, Rfl: 1   lisinopril-hydrochlorothiazide (ZESTORETIC) 20-12.5 MG tablet, Take 1 tablet by mouth daily. (Patient taking differently: Take 1 tablet by mouth every morning.), Disp: 90 tablet, Rfl: 1   Menthol-Zinc Oxide (GOLD BOND EX), Apply 1 application topically daily as needed (pain)., Disp: , Rfl:    metFORMIN (GLUCOPHAGE-XR) 500 MG 24 hr tablet, Take 2 tablets (1,000 mg total) by mouth 2 (two) times daily. (Patient not taking: No sig reported), Disp: 180 tablet, Rfl: 1   naloxone (NARCAN) nasal spray 4 mg/0.1 mL, Use for opioid OD, Disp: 1 each, Rfl: 1   Polyvinyl Alcohol-Povidone (REFRESH OP), Place 1 drop into both eyes at bedtime as needed (itching/dry eyes)., Disp: , Rfl:   No Known Allergies        Objective:  Physical Exam  General: AAO x3, NAD  Dermatological: Hyperkeratotic tissue present distal aspect left fourth toe without any underlying ulceration drainage or signs of infection.  There is no open lesions.  Vascular: Dorsalis Pedis artery and Posterior Tibial artery pedal pulses are 1/4 bilateral with immedate capillary fill time. There is no pain with calf compression, swelling, warmth, erythema.   Neruologic: Grossly intact via light touch bilateral.   Musculoskeletal: Hammertoes are present with prominent metatarsal head plantarly.   Muscular strength 5/5 in all groups tested bilateral.  Gait: Unassisted, Nonantalgic.       Assessment:   Hammertoe deformities resulted in hyperkeratotic lesion left fourth toe, concern for PAD, neuropathy    Plan:  -Treatment options discussed including all alternatives, risks, and complications -Etiology of symptoms were discussed -X-rays were obtained and reviewed with the patient.  There is no evidence of acute fracture or stress fracture.  Hammertoes are present.  Vessel calcification is noted. -Chart does have documented PVD.  Ordered arterial studies.  Also given the nerve symptoms we discussed different treatment options and have ordered Lyrica.  Discussed side effects of medication. -Toe crest for the hammertoes. -Discussed shoe  modifications to help with accommodation of the hammertoes -Sharply debrided hyperkeratotic lesion x1 without any complications or bleeding.  Trula Slade DPM

## 2021-04-16 ENCOUNTER — Other Ambulatory Visit: Payer: Self-pay

## 2021-04-16 ENCOUNTER — Ambulatory Visit (HOSPITAL_COMMUNITY)
Admission: RE | Admit: 2021-04-16 | Discharge: 2021-04-16 | Disposition: A | Discharge status: Home / Self Care | Payer: Medicare HMO | Source: Ambulatory Visit | Attending: Podiatry | Admitting: Podiatry

## 2021-04-16 DIAGNOSIS — I739 Peripheral vascular disease, unspecified: Secondary | ICD-10-CM | POA: Diagnosis not present

## 2021-05-07 ENCOUNTER — Ambulatory Visit: Payer: Medicare HMO | Admitting: Internal Medicine

## 2021-05-14 ENCOUNTER — Telehealth: Payer: Self-pay | Admitting: Podiatry

## 2021-05-14 ENCOUNTER — Other Ambulatory Visit: Payer: Self-pay | Admitting: Podiatry

## 2021-05-14 MED ORDER — PREGABALIN 75 MG PO CAPS: 75.0000 mg | ORAL_CAPSULE | Freq: Two times a day (BID) | ORAL | 1 refills | Status: DC

## 2021-05-14 NOTE — Telephone Encounter (Signed)
Pt is calling for a refill of medication - pregabalin 75 mg capsule bid #60 ?[facility]. [place] ? ?Pt also wants to know if she could get several refills of this so she doesn't have to keep calling every month for refills. ? ?Please advise. ? ? ?

## 2021-05-15 NOTE — Telephone Encounter (Signed)
I spoke to patient to inform her of medication that was sent in to [facility] with refills. ?

## 2021-06-06 ENCOUNTER — Ambulatory Visit: Payer: Medicare HMO | Admitting: Podiatry

## 2021-06-08 ENCOUNTER — Encounter: Payer: Self-pay | Admitting: Podiatry

## 2021-06-08 ENCOUNTER — Ambulatory Visit: Payer: Medicare HMO | Admitting: Podiatry

## 2021-06-08 DIAGNOSIS — M2042 Other hammer toe(s) (acquired), left foot: Secondary | ICD-10-CM | POA: Diagnosis not present

## 2021-06-08 DIAGNOSIS — M2041 Other hammer toe(s) (acquired), right foot: Secondary | ICD-10-CM

## 2021-06-08 DIAGNOSIS — M7752 Other enthesopathy of left foot: Secondary | ICD-10-CM | POA: Diagnosis not present

## 2021-06-08 MED ORDER — TRIAMCINOLONE ACETONIDE 10 MG/ML IJ SUSP
10.0000 mg | Freq: Once | INTRAMUSCULAR | Status: AC
  Administered 2021-06-08: 10 mg

## 2021-06-11 NOTE — Progress Notes (Signed)
Subjective:  ? ?Patient ID: Deanna Watson, female   DOB: 71 y.o.   MRN: [id number]  ? ?HPI ?Patient presents stating that she is developing a lot of pain in her left foot and has chronic digital deformities of both feet that she is concerned about  ? ? ?ROS ? ? ?   ?Objective:  ?Physical Exam  ?Neurovascular status found to be intact muscle strength adequate range of motion adequate with patient found to have digital deformities 2 3 and 4 both feet which have some redness on top and severe inflammation of the second metatarsal phalangeal joint of the left foot.  Patient has numerous questions concerning digital deformities and consideration for correction  ? ?   ?Assessment:  ?Rigid contracture of lesser digits bilateral along with inflammatory fluid buildup second metatarsal phalangeal joint with pain ? ?   ?Plan:  ?H&P x-rays reviewed conditions discussed.  For digital deformities consideration for digital fusion as there but we will hold off currently and we will get a focus on the left foot today I did a anesthetic block of the left forefoot I then did sterile prep of the area aspirated the second MPJ getting out a small amount of clear fluid injected quarter cc dexamethasone Kenalog and applied padding with rigid bottom shoes.  Reappoint to recheck ? ?X-rays indicate that there is hammertoe deformity with moderate rigid contracture to 3 and 4 of both feet no indication stress fracture arthritis left forefoot ?   ? ?

## 2021-06-22 ENCOUNTER — Ambulatory Visit: Payer: Medicare HMO | Admitting: Podiatry

## 2021-06-29 ENCOUNTER — Encounter: Payer: Self-pay | Admitting: Gastroenterology

## 2021-07-05 ENCOUNTER — Ambulatory Visit: Payer: Medicare HMO | Admitting: Podiatry

## 2021-07-05 DIAGNOSIS — M2042 Other hammer toe(s) (acquired), left foot: Secondary | ICD-10-CM | POA: Diagnosis not present

## 2021-07-05 DIAGNOSIS — M2041 Other hammer toe(s) (acquired), right foot: Secondary | ICD-10-CM | POA: Diagnosis not present

## 2021-07-08 NOTE — Progress Notes (Signed)
Subjective: 71 year old female presents the office today for continued pain on her toes on her left foot mostly along the third and fourth toes.  She recently had an injection performed by Dr. Paulla Dolly and she states this was not helpful.  She gets pain to the tips of the toes.  The toe crest has been helpful.  No recent injury or change otherwise.  Objective: AAO x3, NAD DP/PT pulses palpable bilaterally, CRT less than 3 seconds On the second toe with more rigid hammertoe contractures noted.  On the third toe is semirigid and more flexible in the fourth toe.  She gets tenderness the distal portion of the third and fourth toes mostly.  No other areas of discomfort noted today. No pain with calf compression, swelling, warmth, erythema  Assessment: Hammertoe deformities  Plan: -All treatment options discussed with the patient including all alternatives, risks, complications.  -The toe crest have been helpful.  I discussed with her different treatment options both conservative as well as surgical for the hammertoes.  She is not able to undergo surgery at this point we discussed doing a flexor tenotomy in the office.  Discussed pros and cons of this and she wants to proceed with this.  We will plan on doing this next week or 2 at her convenience. -Patient encouraged to call the office with any questions, concerns, change in symptoms.   Trula Slade DPM

## 2021-07-18 ENCOUNTER — Ambulatory Visit: Payer: Medicare HMO | Admitting: Podiatry

## 2021-07-18 VITALS — BP 119/60 | HR 69 | Temp 97.3°F

## 2021-07-18 DIAGNOSIS — M2042 Other hammer toe(s) (acquired), left foot: Secondary | ICD-10-CM

## 2021-07-18 DIAGNOSIS — M2041 Other hammer toe(s) (acquired), right foot: Secondary | ICD-10-CM

## 2021-07-18 NOTE — Progress Notes (Signed)
Subjective: 71 year old female presents the office today for continued pain on her toes on her left foot mostly along the third and fourth toes.  She presents today for flexor tenotomy left third and fourth toes.  The toe crest has been helpful.  She has a painful lesion on the tip of the left fourth toe.  Objective: AAO x3, NAD DP/PT pulses palpable bilaterally, CRT less than 3 seconds On the second toe with more rigid hammertoe contractures noted.  On the third toe is semirigid and more flexible in the fourth toe.  She gets tenderness the distal portion of the third and fourth toes mostly.  No other areas of discomfort noted today.  Preulcerative lesion of the distal left fourth toe. No pain with calf compression, swelling, warmth, erythema  Assessment: Hammertoe deformities  Plan: -All treatment options discussed with the patient including all alternatives, risks, complications.  -The toe crest have been helpful however continue to have discomfort.  We discussed different treatment options surgically.  She does not proceed with further invasive surgical discussed flexor tenotomy and she wants to proceed with this.  Discussed risks and benefits of this.  We discussed risk including but is not helpful, continued deformity, need for further surgery as well as risks including infection, loss of toe.  Consent signed. -Skin was cleaned with alcohol and mixture of 3 cc of lidocaine, Marcaine plain was infiltrated into the third and fourth digits bilaterally.  Once anesthetized the skin was cleaned with Betadine, alcohol.  I then introduced an 18-gauge needle into the plantar aspect of the third and fourth digits.  I utilized this to transect the tendon.  At this time the toes are sitting in a more rectus position.  Incision irrigated.  Antibiotic ointment and a bandage applied.  Discussed daily dressing changes starting tomorrow.  Surgical shoe for offloading.   Return in about 2 weeks (around  08/01/2021) for follow up of flexor tenotomy .  Trula Slade DPM

## 2021-08-06 ENCOUNTER — Ambulatory Visit: Payer: Medicare HMO | Admitting: Podiatry

## 2021-08-06 DIAGNOSIS — M2041 Other hammer toe(s) (acquired), right foot: Secondary | ICD-10-CM

## 2021-08-06 DIAGNOSIS — G629 Polyneuropathy, unspecified: Secondary | ICD-10-CM

## 2021-08-06 DIAGNOSIS — M2042 Other hammer toe(s) (acquired), left foot: Secondary | ICD-10-CM

## 2021-08-06 NOTE — Progress Notes (Signed)
Subjective: 71 year old female presents the office today for follow evaluation of traumatic flexor tenotomy of the left third and fourth toes.  States that she is been doing well but she developed an area on the side of the third toe, lateral aspect which is causing discomfort.  Otherwise has been doing well.  No swelling redness or drainage.  She said the toes look better since the procedure the area of the pain in the fourth toe has resolved.  Objective: AAO x3, NAD DP/PT pulses palpable bilaterally, CRT less than 3 seconds Status post flexor tenotomy.  There is a hyperkeratotic lesion on the lateral third toe without any underlying ulceration drainage or signs of infection.  No other area discomfort.  No edema, erythema or signs of infection. No pain with calf compression, swelling, warmth, erythema  Assessment: Status post left knee trauma, hyperkeratotic lesion  Plan: -All treatment options discussed with the patient including all alternatives, risks, complications.  -Secondary to hyperkeratotic lesion with any complications or bleeding.  Dispensed offloading pads.  Discussed range of motion exercises for the digits. Supportive shoes.  -Patient encouraged to call the office with any questions, concerns, change in symptoms.   Trula Slade DPM

## 2021-08-15 ENCOUNTER — Other Ambulatory Visit: Payer: Self-pay | Admitting: Family Medicine

## 2021-08-15 DIAGNOSIS — Z1231 Encounter for screening mammogram for malignant neoplasm of breast: Secondary | ICD-10-CM

## 2021-08-16 ENCOUNTER — Encounter (HOSPITAL_COMMUNITY): Payer: Self-pay

## 2021-08-16 ENCOUNTER — Emergency Department (HOSPITAL_COMMUNITY)
Admission: EM | Admit: 2021-08-16 | Discharge: 2021-08-16 | Disposition: A | Discharge status: Home / Self Care | Payer: Medicare HMO | Attending: Emergency Medicine | Admitting: Emergency Medicine

## 2021-08-16 ENCOUNTER — Emergency Department (HOSPITAL_COMMUNITY): Payer: Medicare HMO

## 2021-08-16 ENCOUNTER — Other Ambulatory Visit: Payer: Self-pay

## 2021-08-16 DIAGNOSIS — S0003XA Contusion of scalp, initial encounter: Secondary | ICD-10-CM | POA: Diagnosis not present

## 2021-08-16 DIAGNOSIS — J449 Chronic obstructive pulmonary disease, unspecified: Secondary | ICD-10-CM | POA: Diagnosis not present

## 2021-08-16 DIAGNOSIS — S0990XA Unspecified injury of head, initial encounter: Secondary | ICD-10-CM

## 2021-08-16 DIAGNOSIS — I10 Essential (primary) hypertension: Secondary | ICD-10-CM | POA: Insufficient documentation

## 2021-08-16 DIAGNOSIS — E119 Type 2 diabetes mellitus without complications: Secondary | ICD-10-CM | POA: Diagnosis not present

## 2021-08-16 DIAGNOSIS — Z7951 Long term (current) use of inhaled steroids: Secondary | ICD-10-CM | POA: Diagnosis not present

## 2021-08-16 DIAGNOSIS — Z79899 Other long term (current) drug therapy: Secondary | ICD-10-CM | POA: Insufficient documentation

## 2021-08-16 DIAGNOSIS — W01198A Fall on same level from slipping, tripping and stumbling with subsequent striking against other object, initial encounter: Secondary | ICD-10-CM | POA: Insufficient documentation

## 2021-08-16 DIAGNOSIS — Z7982 Long term (current) use of aspirin: Secondary | ICD-10-CM | POA: Diagnosis not present

## 2021-08-16 NOTE — ED Triage Notes (Signed)
Patient states she was getting out of the car 2 days ago and tripped on something, falling backwards and hitting the back of her head. Patient has a hematoma to the back of the head. Patient also has a headache.  Patient denies LOC, N/V, or taking blood thinners.

## 2021-08-16 NOTE — Discharge Instructions (Addendum)
All your x-rays look normal today.  There is no sign of internal bleeding and your neck is normal.  You can continue to take the medications you are using for the headache.  Also a heating pad might help with your neck discomfort.

## 2021-08-16 NOTE — ED Provider Notes (Signed)
[facility] DEPT Provider Note   CSN: [id number] Arrival date & time: 08/16/21  1550     History  Chief Complaint  Patient presents with   Fall   Head Injury    Deanna Watson is a 71 y.o. female.  Patient is a 71 year old female with a history of hypertension, COPD, WPW, GERD, diabetes who is presenting today with complaint of ongoing intermittent headaches and neck pain after a fall 2 days ago.  Patient reports she had gotten out of her car and she had stepped over a sidewalk and was walking but there was something sticking up in the grass that caused her to trip and she fell backwards hitting her head 2 times on the cement.  She had no loss of consciousness, was able to stand and walk after that however since that time she has had ongoing soreness in the back of her head as well as intermittent headaches that move around her head.  She has had no nausea or vomiting.  No change in her vision.  No unilateral weakness or numbness.  No difficulty walking.  She called her doctor because of the ongoing intermittent headaches and they recommend she come here for further care.  She denies any anticoagulation use.  She has been taking ibuprofen as needed for the headache.  The history is provided by the patient.  Fall  Head Injury      Home Medications Prior to Admission medications   Medication Sig Start Date End Date Taking? Authorizing Provider  albuterol (VENTOLIN HFA) 108 (90 Base) MCG/ACT inhaler Inhale 1 puff into the lungs every 6 (six) hours as needed for wheezing or shortness of breath.    [provider]  amLODipine (NORVASC) 5 MG tablet Take 2 tablets (10 mg total) by mouth at bedtime. 12/12/20   Geradine Girt, DO  aspirin EC 81 MG tablet Take 1 tablet (81 mg total) by mouth 2 (two) times daily. Patient taking differently: Take 81 mg by mouth every morning. 11/30/18   Leighton Parody, PA-C  atorvastatin (LIPITOR) 20 MG tablet Take  20 mg by mouth at bedtime. 11/11/20   [provider]  B Complex-C (SUPER B COMPLEX PO) Take 1 tablet by mouth every morning.    [provider]  budesonide-formoterol (SYMBICORT) 160-4.5 MCG/ACT inhaler Inhale 1 puff into the lungs 2 (two) times daily. Patient not taking: No sig reported    [provider]  busPIRone (BUSPAR) 10 MG tablet Take 10 mg by mouth every morning. 11/01/20   [provider]  CALCIUM CARBONATE ANTACID PO Take 1 tablet by mouth 2 (two) times daily as needed for indigestion or heartburn.    [provider]  Cholecalciferol (VITAMIN D-3) 125 MCG (5000 UT) TABS Take 5,000 mg by mouth every morning.    [provider]  escitalopram (LEXAPRO) 20 MG tablet Take 1 tablet (20 mg total) by mouth daily. Patient taking differently: Take 20 mg by mouth every morning. 01/18/19   Janith Lima, MD  fluticasone-salmeterol (ADVAIR) 250-50 MCG/ACT AEPB Inhale 1 puff into the lungs at bedtime. 12/09/20   [provider]  glimepiride (AMARYL) 4 MG tablet Take 4 mg by mouth every morning. 11/12/20   [provider]  glucose blood test strip Use to check blood sugars 2 times daily. Dx Code- E11.41 03/13/17   Margot Ables, NP  hydroxypropyl methylcellulose / hypromellose (ISOPTO TEARS / GONIOVISC) 2.5 % ophthalmic solution Place 1 drop into  both eyes 3 (three) times daily as needed for dry eyes. Patient not taking: No sig reported    [provider]  JARDIANCE 25 MG TABS tablet Take 1 tablet by mouth once daily Patient not taking: No sig reported 11/08/18   Janith Lima, MD  lisinopril-hydrochlorothiazide (ZESTORETIC) 20-12.5 MG tablet Take 1 tablet by mouth daily. Patient taking differently: Take 1 tablet by mouth every morning. 09/25/18   Janith Lima, MD  Menthol-Zinc Oxide (GOLD BOND EX) Apply 1 application topically daily as needed (pain).    [provider]  metFORMIN (GLUCOPHAGE-XR)  500 MG 24 hr tablet Take 2 tablets (1,000 mg total) by mouth 2 (two) times daily. Patient not taking: No sig reported 09/25/18   Janith Lima, MD  naloxone [facility]) nasal spray 4 mg/0.1 mL Use for opioid OD 12/12/20   Geradine Girt, DO  Polyvinyl Alcohol-Povidone (REFRESH OP) Place 1 drop into both eyes at bedtime as needed (itching/dry eyes).    [provider]  pregabalin (LYRICA) 75 MG capsule Take 1 capsule (75 mg total) by mouth 2 (two) times daily. 05/14/21   Trula Slade, DPM      Allergies    Patient has no known allergies.    Review of Systems   Review of Systems  Physical Exam Updated Vital Signs BP (!) 128/56 (BP Location: Left Arm)   Pulse 69   Temp 98.2 F (36.8 C) (Oral)   Resp 14   Ht 5' (1.524 m)   Wt 73.5 kg   SpO2 96%   BMI 31.64 kg/m  Physical Exam Vitals and nursing note reviewed.  Constitutional:      General: She is not in acute distress.    Appearance: She is well-developed.  HENT:     Head: Normocephalic.      Right Ear: Tympanic membrane normal.     Left Ear: Tympanic membrane normal.  Eyes:     Pupils: Pupils are equal, round, and reactive to light.  Neck:   Cardiovascular:     Rate and Rhythm: Normal rate.  Pulmonary:     Effort: Pulmonary effort is normal.  Musculoskeletal:        General: No tenderness. Normal range of motion.     Cervical back: Muscular tenderness present. No spinous process tenderness.     Comments: No edema  Skin:    General: Skin is warm and dry.     Findings: No rash.  Neurological:     Mental Status: She is alert and oriented to person, place, and time. Mental status is at baseline.     Cranial Nerves: No cranial nerve deficit.     Sensory: No sensory deficit.     Motor: No weakness.  Psychiatric:        Mood and Affect: Mood normal.        Behavior: Behavior normal.     ED Results / Procedures / Treatments   Labs (all labs ordered are listed, but only abnormal results are  displayed) Labs Reviewed - No data to display  EKG None  Radiology CT Head Wo Contrast  Result Date: 08/16/2021 CLINICAL DATA:  Headache since fall 2 days ago. EXAM: CT HEAD WITHOUT CONTRAST TECHNIQUE: Contiguous axial images were obtained from the base of the skull through the vertex without intravenous contrast. RADIATION DOSE REDUCTION: This exam was performed according to the departmental dose-optimization program which includes automated exposure control, adjustment of the mA and/or kV according to patient size  and/or use of iterative reconstruction technique. COMPARISON:  None Available. FINDINGS: Brain: No evidence of acute infarction, hemorrhage, hydrocephalus, extra-axial collection or mass lesion/mass effect. Vascular: Atherosclerotic vascular calcification of the carotid siphons. No hyperdense vessel. Skull: Normal. Negative for fracture or focal lesion. Sinuses/Orbits: No acute finding. Other: Small midline posterior scalp hematoma. IMPRESSION: 1. No acute intracranial abnormality. Small midline posterior scalp hematoma. Electronically Signed   By: Titus Dubin M.D.   On: 08/16/2021 16:38   CT Cervical Spine Wo Contrast  Result Date: 08/16/2021 CLINICAL DATA:  Trauma. EXAM: CT CERVICAL SPINE WITHOUT CONTRAST TECHNIQUE: Multidetector CT imaging of the cervical spine was performed without intravenous contrast. Multiplanar CT image reconstructions were also generated. RADIATION DOSE REDUCTION: This exam was performed according to the departmental dose-optimization program which includes automated exposure control, adjustment of the mA and/or kV according to patient size and/or use of iterative reconstruction technique. COMPARISON:  None Available. FINDINGS: Alignment: Normal. Skull base and vertebrae: No acute fracture. No primary bone lesion or focal pathologic process. Soft tissues and spinal canal: No prevertebral fluid or swelling. No visible canal hematoma. Disc levels: There is mild  disc space narrowing and endplate osteophyte formation throughout the cervical spine most significant at C5-C6, C6-C7 and C7-T1. There is facet arthropathy on the left at C3-C4 and C4-C5. There is no severe central canal or neural foraminal stenosis at any level. Upper chest: Negative. Other: None. IMPRESSION: No acute fracture or traumatic subluxation of the cervical spine. Electronically Signed   By: Ronney Asters M.D.   On: 08/16/2021 16:36    Procedures Procedures    Medications Ordered in ED Medications - No data to display  ED Course/ Medical Decision Making/ A&P                           Medical Decision Making Amount and/or Complexity of Data Reviewed Radiology: ordered and independent interpretation performed. Decision-making details documented in ED Course.  Risk OTC drugs.   Pt with multiple medical problems and comorbidities and presenting today with a complaint that caries a high risk for morbidity and mortality.  Presenting today with ongoing headaches after a fall 3 days ago.  Patient is neurologically intact at this time takes no anticoagulation.  However given her age and mechanism of injury we will do a CT of the head and neck to ensure no acute fractures or intracranial hemorrhage. I have independently visualized and interpreted pt's images today. CT of the head without evidence of intracranial hemorrhage.  No cervical spine fractures.  Radiology report no acute findings.        Final Clinical Impression(s) / ED Diagnoses Final diagnoses:  Injury of head, initial encounter  Contusion of scalp, initial encounter    Rx / DC Orders ED Discharge Orders     None         Blanchie Dessert, MD 08/16/21 1659

## 2021-09-12 ENCOUNTER — Other Ambulatory Visit: Payer: Self-pay | Admitting: Podiatry

## 2021-10-08 ENCOUNTER — Ambulatory Visit: Payer: Medicare HMO | Admitting: Diagnostic Neuroimaging

## 2021-10-08 ENCOUNTER — Encounter: Payer: Self-pay | Admitting: Diagnostic Neuroimaging

## 2021-10-08 VITALS — BP 142/71 | HR 65 | Ht 60.0 in | Wt 167.0 lb

## 2021-10-08 DIAGNOSIS — E1142 Type 2 diabetes mellitus with diabetic polyneuropathy: Secondary | ICD-10-CM | POA: Diagnosis not present

## 2021-10-08 NOTE — Progress Notes (Signed)
[facility]  PATIENT: Deanna Watson DOB: Feb 13, 1951  REFERRING CLINICIAN: Trula Slade, DPM HISTORY FROM: patient  REASON FOR VISIT: new consult    HISTORICAL  CHIEF COMPLAINT:  Chief Complaint  Patient presents with   Neuropathy    Rm 7 New Pt "neuropathy in hands and feet"    HISTORY OF PRESENT ILLNESS:   71 year old female with history of diabetes here for evaluation of numbness and tingling.  Has had numbness and tingling in hands and feet for last 6 to 12 months.  Long history of diabetes with hemoglobin A1c greater than 11.  This improved to less than 7 about 2 years ago, but has worsened in the last year.  A1c again greater than 11.  Has been diagnosed with polymyalgia rheumatica recently and on prednisone.  This may have exacerbated diabetes.  Now patient is on insulin. Also with generalized headaches, gait difficulty, pain all over --> these are longstanding issues.   REVIEW OF SYSTEMS: Full 14 system review of systems performed and negative with exception of: as per HPI.  ALLERGIES: No Known Allergies  HOME MEDICATIONS: Outpatient Medications Prior to Visit  Medication Sig Dispense Refill   albuterol (VENTOLIN HFA) 108 (90 Base) MCG/ACT inhaler Inhale 1 puff into the lungs every 6 (six) hours as needed for wheezing or shortness of breath.     aspirin EC 81 MG tablet Take 1 tablet (81 mg total) by mouth 2 (two) times daily. (Patient taking differently: Take 81 mg by mouth every morning.) 60 tablet 0   atorvastatin (LIPITOR) 20 MG tablet Take 20 mg by mouth at bedtime.     B Complex-C (SUPER B COMPLEX PO) Take 1 tablet by mouth every morning.     budesonide-formoterol (SYMBICORT) 160-4.5 MCG/ACT inhaler Inhale 1 puff into the lungs 2 (two) times daily.     busPIRone (BUSPAR) 10 MG tablet Take 10 mg by mouth every morning.     CALCIUM CARBONATE ANTACID PO Take 1 tablet by mouth 2 (two) times daily as needed for indigestion or heartburn.      Cholecalciferol (VITAMIN D-3) 125 MCG (5000 UT) TABS Take 5,000 mg by mouth every morning.     escitalopram (LEXAPRO) 20 MG tablet Take 1 tablet (20 mg total) by mouth daily. (Patient taking differently: Take 20 mg by mouth every morning.) 90 tablet 1   fluticasone-salmeterol (ADVAIR) 250-50 MCG/ACT AEPB Inhale 1 puff into the lungs at bedtime.     glimepiride (AMARYL) 4 MG tablet Take 4 mg by mouth every morning.     glucose blood test strip Use to check blood sugars 2 times daily. Dx Code- E11.41 100 each 12   hydroxypropyl methylcellulose / hypromellose (ISOPTO TEARS / GONIOVISC) 2.5 % ophthalmic solution Place 1 drop into both eyes 3 (three) times daily as needed for dry eyes.     JARDIANCE 25 MG TABS tablet Take 1 tablet by mouth once daily 90 tablet 1   lisinopril-hydrochlorothiazide (ZESTORETIC) 20-12.5 MG tablet Take 1 tablet by mouth daily. (Patient taking differently: Take 1 tablet by mouth every morning.) 90 tablet 1   Menthol-Zinc Oxide (GOLD BOND EX) Apply 1 application topically daily as needed (pain).     metFORMIN (GLUCOPHAGE-XR) 500 MG 24 hr tablet Take 2 tablets (1,000 mg total) by mouth 2 (two) times daily. 180 tablet 1   naloxone (NARCAN) nasal spray 4 mg/0.1 mL Use for opioid OD 1 each 1   Polyvinyl Alcohol-Povidone (REFRESH OP) Place 1 drop  into both eyes at bedtime as needed (itching/dry eyes).     predniSONE (DELTASONE) 5 MG tablet Take 5 mg by mouth daily.     amLODipine (NORVASC) 5 MG tablet Take 2 tablets (10 mg total) by mouth at bedtime. (Patient not taking: Reported on 10/08/2021) 30 tablet 0   pregabalin (LYRICA) 75 MG capsule Take 1 capsule by mouth twice daily (Patient not taking: Reported on 10/08/2021) 180 capsule 0   No facility-administered medications prior to visit.    PAST MEDICAL HISTORY: Past Medical History:  Diagnosis Date   Allergy    Anemia    history of   Arthritis    Asthma    well controlled   Bell's palsy    lasted only about 2 days    Chicken pox    Chronic bronchitis (HCC)    COPD (chronic obstructive pulmonary disease) ([place])    Depression    Diabetes mellitus without complication ([place])    Dysrhythmia    hx of WPW now resolved but has palpatations   Fatty liver    mild   Fibromyalgia    GERD (gastroesophageal reflux disease)    History of blood transfusion    Hyperlipidemia    Hypertension    Mild cardiomegaly    Peripheral neuropathy    PONV (postoperative nausea and vomiting)    Sciatica    WPW (Wolff-Parkinson-White syndrome)     PAST SURGICAL HISTORY: Past Surgical History:  Procedure Laterality Date   CARDIAC ELECTROPHYSIOLOGY MAPPING AND ABLATION  2009   CERVICAL CONE BIOPSY     CHOLECYSTECTOMY     COLONOSCOPY     JOINT REPLACEMENT     LAPAROSCOPIC CHOLECYSTECTOMY SINGLE SITE WITH INTRAOPERATIVE CHOLANGIOGRAM N/A 01/23/2017   Procedure: LAPAROSCOPIC CHOLECYSTECTOMY SINGLE SITE WITH INTRAOPERATIVE CHOLANGIOGRAM ERAS PATHWAY;  Surgeon: Michael Boston, MD;  Location: [facility];  Service: General;  Laterality: N/A;   LIVER BIOPSY N/A 01/23/2017   Procedure: BIOPSY OF LIVER;  Surgeon: Michael Boston, MD;  Location: [facility];  Service: General;  Laterality: N/A;   REPLACEMENT TOTAL KNEE Left 2005   TOTAL KNEE ARTHROPLASTY Right 11/30/2018   Procedure: RIGHT TOTAL KNEE ARTHROPLASTY;  Surgeon: Frederik Pear, MD;  Location: [facility];  Service: Orthopedics;  Laterality: Right;   UPPER GI ENDOSCOPY      FAMILY HISTORY: Family History  Problem Relation Age of Onset   Arthritis Mother    Heart disease Mother        CHF age 10   Stroke Mother    Hypertension Mother    Kidney disease Mother    Diabetes Mother    Alcohol abuse Father    Epilepsy Father    Heart disease Brother        Died in his sleep age 34.     Ovarian cancer Paternal Aunt    Stomach cancer Paternal Uncle    Breast cancer Maternal Aunt    Stomach cancer Maternal Uncle    Colon cancer Neg Hx     SOCIAL HISTORY: Social History    Socioeconomic History   Marital status: Married    Spouse name: Not on file   Number of children: 1   Years of education: 12   Highest education level: Not on file  Occupational History   Occupation: Retired  Tobacco Use   Smoking status: Never   Smokeless tobacco: Never  Vaping Use   Vaping Use: Never used  Substance and Sexual Activity   Alcohol use: No   Drug  use: No   Sexual activity: Not on file  Other Topics Concern   Not on file  Social History Narrative   Lives at home with husband, dog and bird.     Social Determinants of Health   Financial Resource Strain: Not on file  Food Insecurity: Not on file  Transportation Needs: Not on file  Physical Activity: Not on file  Stress: Not on file  Social Connections: Not on file  Intimate Partner Violence: Not on file     PHYSICAL EXAM  GENERAL EXAM/CONSTITUTIONAL: Vitals:  Vitals:   10/08/21 1155  BP: (!) 142/71  Pulse: 65  Weight: 167 lb (75.8 kg)  Height: 5' (1.524 m)   Body mass index is 32.61 kg/m. Wt Readings from Last 3 Encounters:  10/08/21 167 lb (75.8 kg)  08/16/21 162 lb (73.5 kg)  12/11/20 168 lb 6.9 oz (76.4 kg)   Patient is in no distress; well developed, nourished and groomed; neck is supple  CARDIOVASCULAR: Examination of carotid arteries is normal; no carotid bruits Regular rate and rhythm, no murmurs Examination of peripheral vascular system by observation and palpation is normal  EYES: Ophthalmoscopic exam of optic discs and posterior segments is normal; no papilledema or hemorrhages No results found.  MUSCULOSKELETAL: Gait, strength, tone, movements noted in Neurologic exam below  NEUROLOGIC: MENTAL STATUS:      No data to display         awake, alert, oriented to person, place and time recent and remote memory intact normal attention and concentration language fluent, comprehension intact, naming intact fund of knowledge appropriate  CRANIAL NERVE:  2nd - no  papilledema on fundoscopic exam 2nd, 3rd, 4th, 6th - pupils equal and reactive to light, visual fields full to confrontation, extraocular muscles intact, no nystagmus 5th - facial sensation symmetric 7th - facial strength symmetric 8th - hearing intact 9th - palate elevates symmetrically, uvula midline 11th - shoulder shrug symmetric 12th - tongue protrusion midline  MOTOR:  normal bulk and tone, full strength in the BUE, BLE  SENSORY:  normal and symmetric to light touch, temperature, vibration  COORDINATION:  finger-nose-finger, fine finger movements normal  REFLEXES:  deep tendon reflexes TRACE and symmetric  GAIT/STATION:  narrow based gait     DIAGNOSTIC DATA (LABS, IMAGING, TESTING) - I reviewed patient records, labs, notes, testing and imaging myself where available.  Lab Results  Component Value Date   WBC 5.0 12/11/2020   HGB 11.1 (L) 12/11/2020   HCT 34.3 (L) 12/11/2020   MCV 90.3 12/11/2020   PLT 184 12/11/2020      Component Value Date/Time   NA 136 12/11/2020 0610   K 4.2 12/11/2020 0610   CL 96 (L) 12/11/2020 0610   CO2 30 12/11/2020 0610   GLUCOSE 219 (H) 12/11/2020 0610   BUN 17 12/11/2020 0610   CREATININE 0.89 12/11/2020 0610   CALCIUM 8.8 (L) 12/11/2020 0610   PROT 6.4 (L) 12/11/2020 0610   ALBUMIN 3.2 (L) 12/11/2020 0610   AST 29 12/11/2020 0610   ALT 16 12/11/2020 0610   ALKPHOS 70 12/11/2020 0610   BILITOT 1.9 (H) 12/11/2020 0610   GFRNONAA >60 12/11/2020 0610   GFRAA >60 12/01/2018 0326   Lab Results  Component Value Date   CHOL 175 03/06/2018   HDL 77.40 03/06/2018   LDLCALC 81 03/06/2018   TRIG 80.0 03/06/2018   CHOLHDL 2 03/06/2018   Lab Results  Component Value Date   HGBA1C 6.8 (H) 11/30/2018   Lab  Results  Component Value Date   JSEGBTDV76 160 12/26/2017   Lab Results  Component Value Date   TSH 0.75 08/13/2017    07/05/20 [facility] (Dr. Shela Leff) Meda Coffee reviewed study report; lower extremities not tested. -VRP]  This  is an abnormal study. 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 year old female here with:   Dx:  1. Diabetic polyneuropathy associated with type 2 diabetes mellitus (HCC)      PLAN:  NUMBNESS (hands and feet) --> likely diabetic neuropathy (recent A1c > 11) - continue diabetes control - patient not interested in pain meds at this time - continue supportive care --> consider capsaicin cream, lidocaine patch / cream, alpha-lipoic acid '600mg'$  daily  Return for return to PCP, pending if symptoms worsen or fail to improve.    Penni Bombard, MD 7/37/1062, 69:48 PM Certified in Neurology, Neurophysiology and Neuroimaging  [facility] [address] [place], [place] [address] [phone]

## 2021-10-08 NOTE — Patient Instructions (Signed)
NUMBNESS (hands and feet) --> likely diabetic neuropathy (recent A1c > 11) - continue diabetes control - patient not interested in pain meds at this time - continue supportive care --> consider capsaicin cream, lidocaine patch / cream, alpha-lipoic acid '600mg'$  daily

## 2022-05-09 ENCOUNTER — Ambulatory Visit: Payer: Medicare HMO | Admitting: Podiatry

## 2022-11-01 ENCOUNTER — Encounter (HOSPITAL_BASED_OUTPATIENT_CLINIC_OR_DEPARTMENT_OTHER): Payer: Self-pay

## 2022-11-01 ENCOUNTER — Other Ambulatory Visit: Payer: Self-pay

## 2022-11-01 ENCOUNTER — Emergency Department (HOSPITAL_BASED_OUTPATIENT_CLINIC_OR_DEPARTMENT_OTHER)
Admission: EM | Admit: 2022-11-01 | Discharge: 2022-11-01 | Disposition: A | Discharge status: Home / Self Care | Payer: Medicare HMO | Attending: Emergency Medicine | Admitting: Emergency Medicine

## 2022-11-01 ENCOUNTER — Emergency Department (HOSPITAL_BASED_OUTPATIENT_CLINIC_OR_DEPARTMENT_OTHER): Payer: Medicare HMO

## 2022-11-01 DIAGNOSIS — U071 COVID-19: Secondary | ICD-10-CM | POA: Insufficient documentation

## 2022-11-01 DIAGNOSIS — Z7982 Long term (current) use of aspirin: Secondary | ICD-10-CM | POA: Diagnosis not present

## 2022-11-01 DIAGNOSIS — E119 Type 2 diabetes mellitus without complications: Secondary | ICD-10-CM | POA: Insufficient documentation

## 2022-11-01 DIAGNOSIS — G20C Parkinsonism, unspecified: Secondary | ICD-10-CM | POA: Diagnosis not present

## 2022-11-01 DIAGNOSIS — Z79899 Other long term (current) drug therapy: Secondary | ICD-10-CM | POA: Insufficient documentation

## 2022-11-01 DIAGNOSIS — J449 Chronic obstructive pulmonary disease, unspecified: Secondary | ICD-10-CM | POA: Insufficient documentation

## 2022-11-01 DIAGNOSIS — R059 Cough, unspecified: Secondary | ICD-10-CM | POA: Diagnosis present

## 2022-11-01 DIAGNOSIS — Z7984 Long term (current) use of oral hypoglycemic drugs: Secondary | ICD-10-CM | POA: Diagnosis not present

## 2022-11-01 LAB — BASIC METABOLIC PANEL
Anion gap: 7 (ref 5–15)
BUN: 19 mg/dL (ref 8–23)
CO2: 28 mmol/L (ref 22–32)
Calcium: 9.3 mg/dL (ref 8.9–10.3)
Chloride: 101 mmol/L (ref 98–111)
Creatinine, Ser: 0.89 mg/dL (ref 0.44–1.00)
GFR, Estimated: >60 mL/min (ref >60)
Glucose, Bld: 193 mg/dL — ABNORMAL HIGH (ref 70–99)
Potassium: 4.2 mmol/L (ref 3.5–5.1)
Sodium: 136 mmol/L (ref 135–145)

## 2022-11-01 LAB — CBC WITH DIFFERENTIAL/PLATELET
Abs Immature Granulocytes: 0.02 10*3/uL (ref 0.00–0.07)
Basophils Absolute: 0 10*3/uL (ref 0.0–0.1)
Basophils Relative: 1 %
Eosinophils Absolute: 0.1 10*3/uL (ref 0.0–0.5)
Eosinophils Relative: 3 %
HCT: 37 % (ref 36.0–46.0)
Hemoglobin: 12.2 g/dL (ref 12.0–15.0)
Immature Granulocytes: 1 %
Lymphocytes Relative: 27 %
Lymphs Abs: 1 10*3/uL (ref 0.7–4.0)
MCH: 29 pg (ref 26.0–34.0)
MCHC: 33 g/dL (ref 30.0–36.0)
MCV: 88.1 fL (ref 80.0–100.0)
Monocytes Absolute: 0.6 10*3/uL (ref 0.1–1.0)
Monocytes Relative: 16 %
Neutro Abs: 2 10*3/uL (ref 1.7–7.7)
Neutrophils Relative %: 52 %
Platelets: 169 10*3/uL (ref 150–400)
RBC: 4.2 MIL/uL (ref 3.87–5.11)
RDW: 12.2 % (ref 11.5–15.5)
WBC: 3.7 10*3/uL — ABNORMAL LOW (ref 4.0–10.5)
nRBC: 0 % (ref 0.0–0.2)

## 2022-11-01 LAB — SARS CORONAVIRUS 2 BY RT PCR: SARS Coronavirus 2 by RT PCR: POSITIVE — AB

## 2022-11-01 MED ORDER — BENZONATATE 100 MG PO CAPS
100.0000 mg | ORAL_CAPSULE | Freq: Three times a day (TID) | ORAL | 0 refills | Status: AC
Start: 2022-11-01 — End: ?

## 2022-11-01 MED ORDER — ACETAMINOPHEN 325 MG PO TABS
650.0000 mg | ORAL_TABLET | Freq: Once | ORAL | Status: AC
  Administered 2022-11-01: 650 mg via ORAL
  Filled 2022-11-01: qty 2

## 2022-11-01 NOTE — ED Provider Notes (Signed)
[place] [facility] Provider Note   CSN: [id number] Arrival date & time: 11/01/22  1546     History  Chief Complaint  Patient presents with   URI    Deanna Watson is a 72 y.o. female history of diabetes, MDD, Wolf Parkinson White status post ablation, COPD presented with URI-like symptoms for the past week.  Patient is he has a dry cough and is endorsing generalized weakness with rhinorrhea.  Patient has tried a few doses of Tylenol with Benadryl and tea which is helped but wanted to be evaluated for possibly having COVID.  Patient notes that her husband at home does have the same symptoms.  Patient notes every once in a while she feels that she is wheezing but does not need to use an albuterol inhaler.  Patient is able to eat and drink without issue.  Patient does feel short of breath with her symptoms.  Patient denies chest pain, recent travel/hospitalization/surgery, previous blood clots, estrogen use, personal history of cancer, changes sensation ecchymosis, vision changes, neck stiffness  Home Medications Prior to Admission medications   Medication Sig Start Date End Date Taking? Authorizing Provider  benzonatate (TESSALON) 100 MG capsule Take 1 capsule (100 mg total) by mouth every 8 (eight) hours. 11/01/22  Yes Kimiyo Carmicheal, Beverly Gust, PA-C  albuterol (VENTOLIN HFA) 108 (90 Base) MCG/ACT inhaler Inhale 1 puff into the lungs every 6 (six) hours as needed for wheezing or shortness of breath.    [provider]  amLODipine (NORVASC) 5 MG tablet Take 2 tablets (10 mg total) by mouth at bedtime. Patient not taking: Reported on 10/08/2021 12/12/20   Joseph Art, DO  aspirin EC 81 MG tablet Take 1 tablet (81 mg total) by mouth 2 (two) times daily. Patient taking differently: Take 81 mg by mouth every morning. 11/30/18   Allena Katz, PA-C  atorvastatin (LIPITOR) 20 MG tablet Take 20 mg by mouth at bedtime. 11/11/20   [provider]   B Complex-C (SUPER B COMPLEX PO) Take 1 tablet by mouth every morning.    [provider]  budesonide-formoterol (SYMBICORT) 160-4.5 MCG/ACT inhaler Inhale 1 puff into the lungs 2 (two) times daily.    [provider]  busPIRone (BUSPAR) 10 MG tablet Take 10 mg by mouth every morning. 11/01/20   [provider]  CALCIUM CARBONATE ANTACID PO Take 1 tablet by mouth 2 (two) times daily as needed for indigestion or heartburn.    [provider]  Cholecalciferol (VITAMIN D-3) 125 MCG (5000 UT) TABS Take 5,000 mg by mouth every morning.    [provider]  escitalopram (LEXAPRO) 20 MG tablet Take 1 tablet (20 mg total) by mouth daily. Patient taking differently: Take 20 mg by mouth every morning. 01/18/19   Etta Grandchild, MD  fluticasone-salmeterol (ADVAIR) 250-50 MCG/ACT AEPB Inhale 1 puff into the lungs at bedtime. 12/09/20   [provider]  glimepiride (AMARYL) 4 MG tablet Take 4 mg by mouth every morning. 11/12/20   [provider]  glucose blood test strip Use to check blood sugars 2 times daily. Dx Code- E11.41 03/13/17   Meryl Dare, NP  hydroxypropyl methylcellulose / hypromellose (ISOPTO TEARS / GONIOVISC) 2.5 % ophthalmic solution Place 1 drop into both eyes 3 (three) times daily as needed for dry eyes.    [provider]  JARDIANCE 25 MG TABS tablet Take 1 tablet by mouth once daily 11/08/18   Etta Grandchild,  MD  lisinopril-hydrochlorothiazide (ZESTORETIC) 20-12.5 MG tablet Take 1 tablet by mouth daily. Patient taking differently: Take 1 tablet by mouth every morning. 09/25/18   Etta Grandchild, MD  Menthol-Zinc Oxide (GOLD BOND EX) Apply 1 application topically daily as needed (pain).    [provider]  metFORMIN (GLUCOPHAGE-XR) 500 MG 24 hr tablet Take 2 tablets (1,000 mg total) by mouth 2 (two) times daily. 09/25/18   Etta Grandchild, MD  naloxone [facility]) nasal spray 4 mg/0.1 mL Use for opioid OD  12/12/20   Joseph Art, DO  Polyvinyl Alcohol-Povidone (REFRESH OP) Place 1 drop into both eyes at bedtime as needed (itching/dry eyes).    [provider]  predniSONE (DELTASONE) 5 MG tablet Take 5 mg by mouth daily. 05/22/21   [provider]  pregabalin (LYRICA) 75 MG capsule Take 1 capsule by mouth twice daily Patient not taking: Reported on 10/08/2021 09/12/21   Vivi Barrack, DPM      Allergies    Patient has no known allergies.    Review of Systems   Review of Systems  Physical Exam Updated Vital Signs BP (!) 120/57 (BP Location: Right Arm)   Pulse 79   Temp 97.9 F (36.6 C)   Resp 20   Wt 76.7 kg   SpO2 97%   BMI 33.01 kg/m  Physical Exam Vitals reviewed.  Constitutional:      General: She is not in acute distress. HENT:     Head: Normocephalic and atraumatic.     Nose: Nose normal.     Mouth/Throat:     Mouth: Mucous membranes are moist.     Pharynx: No oropharyngeal exudate or posterior oropharyngeal erythema.  Eyes:     Extraocular Movements: Extraocular movements intact.     Conjunctiva/sclera: Conjunctivae normal.     Pupils: Pupils are equal, round, and reactive to light.  Cardiovascular:     Rate and Rhythm: Normal rate and regular rhythm.     Pulses: Normal pulses.     Heart sounds: Normal heart sounds.     Comments: 2+ bilateral radial/dorsalis pedis pulses with regular rate Pulmonary:     Effort: Pulmonary effort is normal. No respiratory distress.     Breath sounds: Normal breath sounds.  Abdominal:     Palpations: Abdomen is soft.     Tenderness: There is no abdominal tenderness. There is no guarding or rebound.  Musculoskeletal:        General: Normal range of motion.     Cervical back: Normal range of motion and neck supple.     Comments: 5 out of 5 bilateral grip/leg extension strength  Skin:    General: Skin is warm and dry.     Capillary Refill: Capillary refill takes less than 2 seconds.  Neurological:      General: No focal deficit present.     Mental Status: She is alert and oriented to person, place, and time.     Comments: Sensation intact in all 4 limbs  Psychiatric:        Mood and Affect: Mood normal.     ED Results / Procedures / Treatments   Labs (all labs ordered are listed, but only abnormal results are displayed) Labs Reviewed  SARS CORONAVIRUS 2 BY RT PCR - Abnormal; Notable for the following components:      Result Value   SARS Coronavirus 2 by RT PCR POSITIVE (*)    All other components within normal limits  CBC WITH  DIFFERENTIAL/PLATELET - Abnormal; Notable for the following components:   WBC 3.7 (*)    All other components within normal limits  BASIC METABOLIC PANEL - Abnormal; Notable for the following components:   Glucose, Bld 193 (*)    All other components within normal limits    EKG None  Radiology DG Chest 2 View  Result Date: 11/01/2022 CLINICAL DATA:  Cough EXAM: CHEST - 2 VIEW COMPARISON:  12/11/2020 FINDINGS: The heart size and mediastinal contours are within normal limits. Hyperinflation. No consolidation, pneumothorax or effusion. No edema. Calcified aorta. Degenerative changes of the spine. Slight rotation of the frontal x-ray. Degenerative changes also seen of the shoulders. The visualized skeletal structures are unremarkable. IMPRESSION: Hyperinflation with chronic changes. Electronically Signed   By: Karen Kays M.D.   On: 11/01/2022 18:14    Procedures Procedures    Medications Ordered in ED Medications  acetaminophen (TYLENOL) tablet 650 mg (650 mg Oral Given 11/01/22 1725)    ED Course/ Medical Decision Making/ A&P                                 Medical Decision Making Amount and/or Complexity of Data Reviewed Labs: ordered. Radiology: ordered.  Risk OTC drugs.   Ronneka Kelch 72 y.o. presented today for URI like symptoms. Working DDx that I considered at this time includes, but not limited to, viral illness, pharyngitis,  mono, sinusitis, electrolyte abnormality.  R/o DDx: pharyngitis, mono, sinusitis, electrolyte abnormality: these diagnoses are not consistent with patient's history, presentation, physical exam, labs/imaging findings.  Review of prior external notes: 08/16/2021 ED  Unique Tests and My Interpretation:  COVID: Positive CBC: Unremarkable BMP: Unremarkable Chest x-ray: No acute changes  Discussion with Independent Historian: None  Discussion of Management of Tests: None  Risk: Medium: prescription drug management  Risk Stratification Score: None  Plan: On exam patient was in no acute distress with stable vitals.  Patient had unremarkable physical exam.  Initially patient stated that her symptoms began on Monday however after speaking with her she said that she has been having a dry cough since last week which puts her outside the range of Paxlovid.  Patient given Tylenol here to help with her symptoms and awaiting labs.  Patient did test positive for COVID today and which I suspect is causing her shortness of breath however patient did not have any wheezing on exam and is not hypoxic and thus does not need a larger workup at this time.  Patient is in agreements with not having a larger workup and anticipate discharge home with symptomatic management and primary care follow-up.  Patient's labs and imaging reassuring.  Patient at this time is not endorsing sputum production that would be consistent with COPD exacerbation so antibiotics were withheld.  Highly suspect patient symptoms are COVID-related and will discharge with symptomatic management with primary care follow-up.  Patient given Tessalon to help with her cough.  Patient was given return precautions.patient stable for discharge at this time.  Patient verbalized understanding of plan.         Final Clinical Impression(s) / ED Diagnoses Final diagnoses:  COVID    Rx / DC Orders ED Discharge Orders          Ordered     benzonatate (TESSALON) 100 MG capsule  Every 8 hours        11/01/22 1830  Netta Corrigan, PA-C 11/01/22 1830    Loetta Rough, MD 11/05/22 [phone]

## 2022-11-01 NOTE — ED Triage Notes (Addendum)
Pt brought in by EMS for covid test. General flu symptoms, Body aches  dry cough some wheezing,  nasal congestion, intermittent dizziness and cough. VS 123/51, 74, 16, 96% room air. Symptoms since Monday.

## 2022-11-01 NOTE — Discharge Instructions (Addendum)
Please follow-up with your primary care provider regarding recent symptoms and ER visit.  Today you tested positive for COVID which is most likely the cause of your symptoms.  You may use Tylenol every 6 hours needed for pain and remain hydrated and eat food as tolerated.  I have prescribed for you Tessalon to take at home to help with your cough.  If symptoms change or worsen please return to ER.

## 2023-10-12 ENCOUNTER — Encounter (HOSPITAL_BASED_OUTPATIENT_CLINIC_OR_DEPARTMENT_OTHER): Payer: Self-pay

## 2023-10-12 ENCOUNTER — Emergency Department (HOSPITAL_BASED_OUTPATIENT_CLINIC_OR_DEPARTMENT_OTHER)
Admission: EM | Admit: 2023-10-12 | Discharge: 2023-10-12 | Disposition: A | Discharge status: Home / Self Care | Attending: Emergency Medicine | Admitting: Emergency Medicine

## 2023-10-12 ENCOUNTER — Emergency Department (HOSPITAL_BASED_OUTPATIENT_CLINIC_OR_DEPARTMENT_OTHER)

## 2023-10-12 DIAGNOSIS — Z7982 Long term (current) use of aspirin: Secondary | ICD-10-CM | POA: Diagnosis not present

## 2023-10-12 DIAGNOSIS — Z7951 Long term (current) use of inhaled steroids: Secondary | ICD-10-CM | POA: Diagnosis not present

## 2023-10-12 DIAGNOSIS — Z7952 Long term (current) use of systemic steroids: Secondary | ICD-10-CM | POA: Insufficient documentation

## 2023-10-12 DIAGNOSIS — Z79899 Other long term (current) drug therapy: Secondary | ICD-10-CM | POA: Diagnosis not present

## 2023-10-12 DIAGNOSIS — J449 Chronic obstructive pulmonary disease, unspecified: Secondary | ICD-10-CM | POA: Insufficient documentation

## 2023-10-12 DIAGNOSIS — Z7984 Long term (current) use of oral hypoglycemic drugs: Secondary | ICD-10-CM | POA: Insufficient documentation

## 2023-10-12 DIAGNOSIS — I10 Essential (primary) hypertension: Secondary | ICD-10-CM | POA: Diagnosis not present

## 2023-10-12 DIAGNOSIS — T50905A Adverse effect of unspecified drugs, medicaments and biological substances, initial encounter: Secondary | ICD-10-CM

## 2023-10-12 DIAGNOSIS — R441 Visual hallucinations: Secondary | ICD-10-CM | POA: Diagnosis not present

## 2023-10-12 DIAGNOSIS — E1165 Type 2 diabetes mellitus with hyperglycemia: Secondary | ICD-10-CM | POA: Diagnosis not present

## 2023-10-12 DIAGNOSIS — R4182 Altered mental status, unspecified: Secondary | ICD-10-CM | POA: Diagnosis present

## 2023-10-12 DIAGNOSIS — T507X5A Adverse effect of analeptics and opioid receptor antagonists, initial encounter: Secondary | ICD-10-CM | POA: Diagnosis not present

## 2023-10-12 DIAGNOSIS — Y900 Blood alcohol level of less than 20 mg/100 ml: Secondary | ICD-10-CM | POA: Diagnosis not present

## 2023-10-12 LAB — DIFFERENTIAL
Abs Immature Granulocytes: 0.03 K/uL (ref 0.00–0.07)
Basophils Absolute: 0.1 K/uL (ref 0.0–0.1)
Basophils Relative: 1 %
Eosinophils Absolute: 0.1 K/uL (ref 0.0–0.5)
Eosinophils Relative: 2 %
Immature Granulocytes: 1 %
Lymphocytes Relative: 27 %
Lymphs Abs: 1.5 K/uL (ref 0.7–4.0)
Monocytes Absolute: 0.4 K/uL (ref 0.1–1.0)
Monocytes Relative: 7 %
Neutro Abs: 3.5 K/uL (ref 1.7–7.7)
Neutrophils Relative %: 62 %

## 2023-10-12 LAB — URINALYSIS, W/ REFLEX TO CULTURE (INFECTION SUSPECTED)
Bilirubin Urine: NEGATIVE
Glucose, UA: NEGATIVE mg/dL
Ketones, ur: NEGATIVE mg/dL
Leukocytes,Ua: NEGATIVE
Nitrite: NEGATIVE
Protein, ur: NEGATIVE mg/dL
Specific Gravity, Urine: 1.025 (ref 1.005–1.030)
Squamous Epithelial / HPF: NONE SEEN /HPF (ref 0–5)
pH: 5.5 (ref 5.0–8.0)

## 2023-10-12 LAB — COMPREHENSIVE METABOLIC PANEL WITH GFR
ALT: 26 U/L (ref 0–44)
AST: 46 U/L — ABNORMAL HIGH (ref 15–41)
Albumin: 4.3 g/dL (ref 3.5–5.0)
Alkaline Phosphatase: 103 U/L (ref 38–126)
Anion gap: 10 (ref 5–15)
BUN: 24 mg/dL — ABNORMAL HIGH (ref 8–23)
CO2: 30 mmol/L (ref 22–32)
Calcium: 9.3 mg/dL (ref 8.9–10.3)
Chloride: 97 mmol/L — ABNORMAL LOW (ref 98–111)
Creatinine, Ser: 0.94 mg/dL (ref 0.44–1.00)
GFR, Estimated: >60 mL/min (ref >60)
Glucose, Bld: 244 mg/dL — ABNORMAL HIGH (ref 70–99)
Potassium: 3.9 mmol/L (ref 3.5–5.1)
Sodium: 137 mmol/L (ref 135–145)
Total Bilirubin: 0.3 mg/dL (ref 0.0–1.2)
Total Protein: 7 g/dL (ref 6.5–8.1)

## 2023-10-12 LAB — ETHANOL: Alcohol, Ethyl (B): <15 mg/dL (ref <15)

## 2023-10-12 LAB — PROTIME-INR
INR: 0.9 (ref 0.8–1.2)
Prothrombin Time: 12.9 s (ref 11.4–15.2)

## 2023-10-12 LAB — CBC
HCT: 33.4 % — ABNORMAL LOW (ref 36.0–46.0)
Hemoglobin: 10.9 g/dL — ABNORMAL LOW (ref 12.0–15.0)
MCH: 29.2 pg (ref 26.0–34.0)
MCHC: 32.6 g/dL (ref 30.0–36.0)
MCV: 89.5 fL (ref 80.0–100.0)
Platelets: 168 K/uL (ref 150–400)
RBC: 3.73 MIL/uL — ABNORMAL LOW (ref 3.87–5.11)
RDW: 11.9 % (ref 11.5–15.5)
WBC: 5.6 K/uL (ref 4.0–10.5)
nRBC: 0 % (ref 0.0–0.2)

## 2023-10-12 LAB — URINE DRUG SCREEN
Amphetamines: NOT DETECTED
Barbiturates: NOT DETECTED
Benzodiazepines: NOT DETECTED
Cocaine: NOT DETECTED
Fentanyl: NOT DETECTED
Methadone Scn, Ur: NOT DETECTED
Opiates: NOT DETECTED
Tetrahydrocannabinol: NOT DETECTED

## 2023-10-12 LAB — APTT: aPTT: 27 s (ref 24–36)

## 2023-10-12 LAB — CBG MONITORING, ED: Glucose-Capillary: 252 mg/dL — ABNORMAL HIGH (ref 70–99)

## 2023-10-12 MED ORDER — SODIUM CHLORIDE 0.9% FLUSH
3.0000 mL | Freq: Once | INTRAVENOUS | Status: AC
  Administered 2023-10-12: 3 mL via INTRAVENOUS
  Filled 2023-10-12: qty 3

## 2023-10-12 NOTE — ED Provider Notes (Signed)
 [place] [facility] Provider Note   CSN: [id number] Arrival date & time: 10/12/23  1531     Patient presents with: Altered Mental Status   Deanna Watson is a 73 y.o. female.    Altered Mental Status Patient brought in for mental status change.  For the last 2 days has been somewhat confused.  Reportedly has had decreased appetite.  Decreased oral intake.  Has been dizzy with standing.  Has had times seeing some hallucinations.  No injury.  However at the beginning of this she had been arranging her and her husband's medicines and the daughter found there was an extra half of a pill from her husband that she may have taken.  Turns out it was buprenorphine.  Patient is not on any opiates at baseline.  This however was 2 days ago with    Past Medical History:  Diagnosis Date   Allergy    Anemia    history of   Arthritis    Asthma    well controlled   Bell's palsy    lasted only about 2 days   Chicken pox    Chronic bronchitis (HCC)    COPD (chronic obstructive pulmonary disease) (HCC)    Depression    Diabetes mellitus without complication (HCC)    Dysrhythmia    hx of WPW now resolved but has palpatations   Fatty liver    mild   Fibromyalgia    GERD (gastroesophageal reflux disease)    History of blood transfusion    Hyperlipidemia    Hypertension    Mild cardiomegaly    Peripheral neuropathy    PONV (postoperative nausea and vomiting)    Sciatica    WPW (Wolff-Parkinson-White syndrome)     Prior to Admission medications   Medication Sig Start Date End Date Taking? Authorizing Provider  albuterol  (VENTOLIN  HFA) 108 (90 Base) MCG/ACT inhaler Inhale 1 puff into the lungs every 6 (six) hours as needed for wheezing or shortness of breath.    [provider]  amLODipine  (NORVASC ) 5 MG tablet Take 2 tablets (10 mg total) by mouth at bedtime. Patient not taking: Reported on 10/08/2021 12/12/20   Vann, Jessica U, DO   aspirin  EC 81 MG tablet Take 1 tablet (81 mg total) by mouth 2 (two) times daily. Patient taking differently: Take 81 mg by mouth every morning. 11/30/18   Orlando Camellia POUR, PA-C  atorvastatin (LIPITOR) 20 MG tablet Take 20 mg by mouth at bedtime. 11/11/20   [provider]  B Complex-C (SUPER B COMPLEX PO) Take 1 tablet by mouth every morning.    [provider]  benzonatate  (TESSALON ) 100 MG capsule Take 1 capsule (100 mg total) by mouth every 8 (eight) hours. 11/01/22   Victor Lynwood DASEN, PA-C  budesonide-formoterol  (SYMBICORT) 160-4.5 MCG/ACT inhaler Inhale 1 puff into the lungs 2 (two) times daily.    [provider]  busPIRone (BUSPAR) 10 MG tablet Take 10 mg by mouth every morning. 11/01/20   [provider]  CALCIUM  CARBONATE ANTACID PO Take 1 tablet by mouth 2 (two) times daily as needed for indigestion or heartburn.    [provider]  Cholecalciferol (VITAMIN D -3) 125 MCG (5000 UT) TABS Take 5,000 mg by mouth every morning.    [provider]  escitalopram  (LEXAPRO ) 20 MG tablet Take 1 tablet (20 mg total) by mouth daily. Patient taking differently: Take 20 mg by mouth every morning. 01/18/19   Joshua Ned  L, MD  fluticasone-salmeterol (ADVAIR) 250-50 MCG/ACT AEPB Inhale 1 puff into the lungs at bedtime. 12/09/20   [provider]  glimepiride  (AMARYL ) 4 MG tablet Take 4 mg by mouth every morning. 11/12/20   [provider]  glucose blood test strip Use to check blood sugars 2 times daily. Dx Code- E11.41 03/13/17   [name], NP  hydroxypropyl methylcellulose / hypromellose (ISOPTO TEARS / GONIOVISC) 2.5 % ophthalmic solution Place 1 drop into both eyes 3 (three) times daily as needed for dry eyes.    [provider]  JARDIANCE  25 MG TABS tablet Take 1 tablet by mouth once daily 11/08/18   Joshua Debby CROME, MD  lisinopril -hydrochlorothiazide  (ZESTORETIC ) 20-12.5 MG tablet Take 1 tablet by mouth  daily. Patient taking differently: Take 1 tablet by mouth every morning. 09/25/18   Joshua Debby CROME, MD  Menthol -Zinc Oxide (GOLD BOND EX) Apply 1 application topically daily as needed (pain).    [provider]  metFORMIN  (GLUCOPHAGE -XR) 500 MG 24 hr tablet Take 2 tablets (1,000 mg total) by mouth 2 (two) times daily. 09/25/18   Joshua Debby CROME, MD  naloxone  (NARCAN ) nasal spray 4 mg/0.1 mL Use for opioid OD 12/12/20   Vann, Jessica U, DO  Polyvinyl Alcohol -Povidone (REFRESH OP) Place 1 drop into both eyes at bedtime as needed (itching/dry eyes).    [provider]  predniSONE (DELTASONE) 5 MG tablet Take 5 mg by mouth daily. 05/22/21   [provider]  pregabalin  (LYRICA ) 75 MG capsule Take 1 capsule by mouth twice daily Patient not taking: Reported on 10/08/2021 09/12/21   [name], DPM    Allergies: Patient has no known allergies.    Review of Systems  Updated Vital Signs BP (!) 160/65   Pulse 65   Temp 98.5 F (36.9 C)   Resp 14   Ht 5' (1.524 m)   Wt 74.8 kg   SpO2 93%   BMI 32.22 kg/m   Physical Exam Vitals and nursing note reviewed.  Eyes:     Extraocular Movements: Extraocular movements intact.     Comments: I was intact but does have nystagmus, particularly with looking to the right.  Does have constricted pupils.  Skin:    Capillary Refill: Capillary refill takes less than 2 seconds.     Coloration: Skin is not jaundiced.  Neurological:     Mental Status: She is alert and oriented to person, place, and time.     (all labs ordered are listed, but only abnormal results are displayed) Labs Reviewed  CBC - Abnormal; Notable for the following components:      Result Value   RBC 3.73 (*)    Hemoglobin 10.9 (*)    HCT 33.4 (*)    All other components within normal limits  COMPREHENSIVE METABOLIC PANEL WITH GFR - Abnormal; Notable for the following components:   Chloride 97 (*)    Glucose, Bld 244 (*)    BUN 24 (*)    AST 46 (*)     All other components within normal limits  URINALYSIS, W/ REFLEX TO CULTURE (INFECTION SUSPECTED) - Abnormal; Notable for the following components:   Hgb urine dipstick TRACE (*)    Bacteria, UA RARE (*)    All other components within normal limits  CBG MONITORING, ED - Abnormal; Notable for the following components:   Glucose-Capillary 252 (*)    All other components within normal limits  PROTIME-INR  APTT  DIFFERENTIAL  ETHANOL  URINE DRUG SCREEN    EKG: EKG Interpretation Date/Time:  Sunday October 12 2023 15:46:54 EDT Ventricular Rate:  73 PR Interval:  162 QRS Duration:  107 QT Interval:  407 QTC Calculation: 449 R Axis:   16  Text Interpretation: Sinus rhythm Incomplete left bundle branch block Anterior Q waves, possibly due to ILBBB Confirmed by Patsey Lot [phone]) on 10/12/2023 4:07:07 PM  Radiology: [facility] Chest Portable 1 View Result Date: 10/12/2023 CLINICAL DATA:  Hypoxia. EXAM: PORTABLE CHEST 1 VIEW COMPARISON:  Chest radiograph dated 07/01/2023. FINDINGS: No focal consolidation, pleural effusion or pneumothorax. Mild cardiomegaly. Atherosclerotic calcification of the aorta. No acute osseous pathology. IMPRESSION: 1. No active disease. 2. Mild cardiomegaly. Electronically Signed   By: Vanetta Chou M.D.   On: 10/12/2023 17:57   CT HEAD WO CONTRAST Result Date: 10/12/2023 CLINICAL DATA:  Altered mental status.  Dysphasia. EXAM: CT HEAD WITHOUT CONTRAST TECHNIQUE: Contiguous axial images were obtained from the base of the skull through the vertex without intravenous contrast. RADIATION DOSE REDUCTION: This exam was performed according to the departmental dose-optimization program which includes automated exposure control, adjustment of the mA and/or kV according to patient size and/or use of iterative reconstruction technique. COMPARISON:  08/16/2021 FINDINGS: Brain: No evidence of intracranial hemorrhage, acute infarction, hydrocephalus, extra-axial collection, or mass  lesion/mass effect. Mild chronic small vessel disease is again noted. Vascular:  No hyperdense vessel or other acute findings. Skull: No evidence of fracture or other significant bone abnormality. Sinuses/Orbits:  No acute findings. Other: None. IMPRESSION: No acute intracranial abnormality. Mild chronic small vessel disease. Electronically Signed   By: Norleen DELENA Kil M.D.   On: 10/12/2023 16:54     Procedures   Medications Ordered in the ED  sodium chloride  flush (NS) 0.9 % injection 3 mL (3 mLs Intravenous Given 10/12/23 1629)                                    Medical Decision Making Amount and/or Complexity of Data Reviewed Labs: ordered. Radiology: ordered.   Patient with mental status change for last couple days.  Began after potentially taking her husband's Suboxone.  However still having symptoms.  Confusion at times.  Hallucinations at times.  Does have constricted pupils which go along with opiates as a cause.  Workup overall reassuring.  Head CT reassuring.  Glucose mildly elevated.  Urine does not show infection.  Discussed with patient and daughter.  Patient does not want to be admitted to the hospital.  However we did discuss admission due to new onset hallucinations that potentially could be from opiates but with 2 days of symptoms after a relatively low dose not necessarily the only cause.  However patient does not want to be admitted.  Will follow-up with her PCP in the next couple days.  Will return for worsening symptoms.  Will discharge home.     Final diagnoses:  Adverse effect of drug, initial encounter  Visual hallucinations    ED Discharge Orders     None          Patsey Lot, MD 10/12/23 2234

## 2023-10-12 NOTE — ED Notes (Signed)
 Pt told EDP the pt accidentally took half of one of her husband's suboxone.

## 2023-10-12 NOTE — ED Triage Notes (Addendum)
 Pt daughter brought pt to ED with complaints of pt having episodes of confusion since Friday. Daughter states that coherent but has trouble looking for words. Daughter states that patient has been having some difficulty with looking for words. Pt complaints of headache and dizziness.    Pt alert and oriented x3.  Daughter states that pt was last known well Friday. Pt also did vomit x 1 yesterday.

## 2023-10-12 NOTE — ED Notes (Signed)
 Pt found to be around 91-92% on room air while completely coherent on the phone. Notified EDP. No SOB per patient.

## 2023-10-12 NOTE — Discharge Instructions (Signed)
 The loose nations are likely from the medicine but we have not been able to find another cause.  If symptoms have not improved with follow-up with her doctor in 2 days.  Return for worsening symptoms
